# Patient Record
Sex: Male | Born: 1945 | Race: White | Hispanic: No | Marital: Married | Smoking: Former smoker
Health system: Southern US, Community
[De-identification: ages and names within clinical notes are randomized; demographics above are authoritative.]

## PROBLEM LIST (undated history)

## (undated) DIAGNOSIS — K5190 Ulcerative colitis, unspecified, without complications: Secondary | ICD-10-CM

## (undated) DIAGNOSIS — K529 Noninfective gastroenteritis and colitis, unspecified: Secondary | ICD-10-CM

## (undated) DIAGNOSIS — E039 Hypothyroidism, unspecified: Secondary | ICD-10-CM

## (undated) DIAGNOSIS — I10 Essential (primary) hypertension: Secondary | ICD-10-CM

## (undated) DIAGNOSIS — E785 Hyperlipidemia, unspecified: Secondary | ICD-10-CM

## (undated) HISTORY — DX: Noninfective gastroenteritis and colitis, unspecified: K52.9

## (undated) HISTORY — DX: Hyperlipidemia, unspecified: E78.5

## (undated) HISTORY — PX: LUNG SURGERY: SHX703

## (undated) HISTORY — DX: Hypothyroidism, unspecified: E03.9

## (undated) HISTORY — DX: Ulcerative colitis, unspecified, without complications: K51.90

## (undated) HISTORY — PX: BACK SURGERY: SHX140

---

## 2001-02-02 ENCOUNTER — Ambulatory Visit (HOSPITAL_COMMUNITY): Admission: RE | Admit: 2001-02-02 | Discharge: 2001-02-02 | Payer: Self-pay | Admitting: Neurosurgery

## 2001-02-02 ENCOUNTER — Encounter: Payer: Self-pay | Admitting: Neurosurgery

## 2001-03-08 ENCOUNTER — Encounter: Admission: RE | Admit: 2001-03-08 | Discharge: 2001-03-24 | Payer: Self-pay | Admitting: Neurosurgery

## 2001-08-04 HISTORY — PX: COLONOSCOPY: SHX174

## 2002-01-26 ENCOUNTER — Ambulatory Visit (HOSPITAL_COMMUNITY): Admission: RE | Admit: 2002-01-26 | Discharge: 2002-01-26 | Payer: Self-pay | Admitting: Internal Medicine

## 2003-03-23 ENCOUNTER — Encounter: Payer: Self-pay | Admitting: Emergency Medicine

## 2003-03-23 ENCOUNTER — Emergency Department (HOSPITAL_COMMUNITY): Admission: EM | Admit: 2003-03-23 | Discharge: 2003-03-23 | Payer: Self-pay

## 2004-08-26 ENCOUNTER — Ambulatory Visit: Payer: Self-pay | Admitting: Family Medicine

## 2005-03-21 ENCOUNTER — Ambulatory Visit: Payer: Self-pay | Admitting: Family Medicine

## 2005-04-16 ENCOUNTER — Ambulatory Visit: Payer: Self-pay | Admitting: Family Medicine

## 2007-09-21 ENCOUNTER — Ambulatory Visit: Payer: Self-pay | Admitting: Vascular Surgery

## 2010-12-17 NOTE — Procedures (Signed)
CAROTID DUPLEX EXAM   INDICATION:  Bilateral visual disturbance.   HISTORY:  Diabetes:  No.  Cardiac:  No.  Hypertension:  Yes.  Smoking:  Quit 30 years ago.  Previous Surgery:  No.  CV History:  No.  Amaurosis Fugax No, Paresthesias No, Hemiparesis No                                       RIGHT             LEFT  Brachial systolic pressure:         NA                NA  Brachial Doppler waveforms:         Biphasic          Biphasic  Vertebral direction of flow:        Antegrade         Antegrade  DUPLEX VELOCITIES (cm/sec)  CCA peak systolic                   143               941  ECA peak systolic                   134               91  ICA peak systolic                   101               60  ICA end diastolic                   20                17  PLAQUE MORPHOLOGY:                  Heterogenous      Heterogenous  PLAQUE AMOUNT:                      Mild              Mild  PLAQUE LOCATION:                    ICA and ECA       ICA and ECA   IMPRESSION:  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  Bilateral  vertebral and basilar arteries appear within normal limits.   ___________________________________________  Judeth Cornfield. Scot Dock, M.D.   MG/MEDQ  D:  09/21/2007  T:  09/22/2007  Job:  [id number]

## 2010-12-20 NOTE — Op Note (Signed)
[facility]  Patient:    Jerome Stark, Jerome Stark Visit Number: [id number] MRN: [id number]          Service Type: END Location: DAY Attending Physician:  Bridgette Habermann Dictated by:   Garfield Cornea, M.D. Proc. Date: 01/26/02 Admit Date:  01/26/2002 Discharge Date: 01/26/2002   CC:         Dr. Edwina Barth   Operative Report  PROCEDURE:  Colonoscopy with biopsy and stool sampling.  INDICATIONS FOR PROCEDURE:  The patient is a 65 year old gentleman with recently bloody diarrhea.  Stool studies were negative as an outpatient.  He comes for colonoscopy.  This approach has been discussed with the patient. The potential risks, benefits, and alternatives have been reviewed and questions answered.  He is agreeable.  He is a low risk for conscious sedation. Please see my dictated consultation note for more information.  PROCEDURE NOTE:  O2 saturation, pulse, and respirations were monitored throughout the entire procedure.  CONSCIOUS SEDATION:  Versed 3 mg IV and Demerol 75 mg IV in divided doses.  INSTRUMENT:  Olympus video chip colonoscope.  FINDINGS:  Digital rectal exam revealed no abnormalities.  ENDOSCOPIC FINDINGS:  The prep was good.  RECTUM:  Examination of the rectal mucosa including a retroflexed view of the anal verge revealed marked inflammatory changes of the distal 12 cm of the rectal mucosa with loss of normal vascular pattern, granular friability, and erosions. This was confluent to 12 cm.  In the more proximal rectum and into the colon, the mucosa appeared entirely normal.  Please see photos.  COLON:  The colonic mucosa was surveyed through the sigmoid junction through the left transverse and right colon to the area of the appendiceal orifice, ileocecal valve, and cecum.  These structures were well-seen and photographed for the record.  From this level, the scope was slowly withdrawn.  All previously mentioned mucosal surfaces were again seen.   The colonic mucosa appeared normal.  Biopsies of the sigmoid mucosa and the abnormal-appearing rectal mucosa were taken.  ______ suctioned for microbiology studies.  The patient tolerated the procedure well and was reacted in endoscopy.  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  The more proximal rectum and colon appeared normal.  Biopsies pending.  RECOMMENDATIONS:  Canasa 500 mg suppositories, 1 per rectum, b.i.d. x2 weeks and then drop back to 1 at bedtime thereafter.  Followup on biopsies and stool studies.  We will have the patient come back to see [place] in four weeks to assess progress. Dictated by:   Garfield Cornea, M.D. Attending Physician:  Bridgette Habermann DD:  01/26/02 TD:  01/28/02 Job: [id number]

## 2010-12-20 NOTE — Op Note (Signed)
[place]  Patient:    Jerome Stark, Jerome Stark                          MRN: [id number] Proc. Date: 02/02/01 Adm. 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  PROCEDURE:  Right L5-S1 laminotomy and microdiskectomy.  SURGEON:  Earnie Larsson, M.D.  ASSISTANT:  Marc Morgans., M.D.  ANESTHESIA:  General endotracheal.  INDICATIONS:  Jerome Stark is a 65 year old white male with history of back and right lower extremity pain consistent with a right-sided S1 radiculopathy, which has been present for approximately six months.  The patient has failed conservative management.  He continues to be bothered by these symptoms with great frequency.  We discussed possible options, including the possibility of undergoing a right-sided L5-S1 laminotomy and microdiskectomy.  The patient wishes to proceed with surgery in hopes of relieving some of his symptoms. The patient is aware of the risks and benefits and wishes to proceed.  DESCRIPTION OF PROCEDURE:  Patient in the operating room, placed on the operating table in the supine position.  After an adequate level of anesthesia achieved, patient positioned prone onto a Wilson frame, appropriately padded. The patients lumbar region is shaved and prepped sterilely.  A #10 blade is used to make a linear skin incision overlying the L5-S1 interspace.  This was carried down sharply in the midline.  A subperiosteal dissection was performed on the right side, exposing the laminae and facet joints of L5 and S1.  A deep self-retaining retractor is placed.  Intraoperative x-ray was taken.  The level was confirmed.  Laminotomy was then performed using the high-speed drill and Kerrison rongeurs to remove the  inferior one-third of the lamina of L5, the medial edge of the L5-S1 facet joint, and the superior rim of the S1 lamina.  Ligamentum flavum was then elevated and resected in a piecemeal fashion using Kerrison rongeurs.  The underlying thecal sac and exiting S1 nerve root were identified.  Microscope was brought into the field and used for microdissection of the right-sided S1 nerve root underlying disk herniation.  Epidural venous plexus coagulated and cut.  Thecal sac and S1 nerve root were mobilized and retracted toward the midline.  The disk herniation at the level of the disk space was readily apparent.  This was isolated and incised with a 15 blade in a rectangular fashion.  A wide disk space clean-out was then achieved using pituitary rongeurs, upward-angled pituitary rongeurs, and Epstein curettes.  A scarred-in, chronic inferior fragment was then dissected free along the course of the right-sided S1 nerve root.  This was completely resected, fully decompressing the right-sided S1 nerve root.  At this point, a probe was passed easily along the course of the thecal sac and nerve roots.  There was no evidence of any continuing compression.  The wound was then copiously irrigated with antibiotic solution. Gelfoam was placed topically for hemostasis, which was found to be good. Microscope and retractor system were removed.  Hemostasis in the muscle was achieved with electrocautery.  The wound was then closed in layers with Vicryl sutures.  Steri-Strips and a sterile dressing were applied.  There were no apparent complications.  The patient tolerated the procedure well, and he returns to the recovery room postop. DD:  02/02/01 TD:  02/02/01 Job: [id number]

## 2013-10-02 HISTORY — PX: COLONOSCOPY: SHX174

## 2014-11-20 ENCOUNTER — Emergency Department (HOSPITAL_COMMUNITY)
Admission: EM | Admit: 2014-11-20 | Discharge: 2014-11-20 | Disposition: A | Discharge status: Home / Self Care | Payer: Medicare Other | Attending: Emergency Medicine | Admitting: Emergency Medicine

## 2014-11-20 ENCOUNTER — Encounter (HOSPITAL_COMMUNITY): Payer: Self-pay | Admitting: Emergency Medicine

## 2014-11-20 ENCOUNTER — Emergency Department (HOSPITAL_COMMUNITY): Payer: Medicare Other

## 2014-11-20 DIAGNOSIS — Z87891 Personal history of nicotine dependence: Secondary | ICD-10-CM | POA: Diagnosis not present

## 2014-11-20 DIAGNOSIS — Z79899 Other long term (current) drug therapy: Secondary | ICD-10-CM | POA: Insufficient documentation

## 2014-11-20 DIAGNOSIS — E079 Disorder of thyroid, unspecified: Secondary | ICD-10-CM | POA: Insufficient documentation

## 2014-11-20 DIAGNOSIS — I10 Essential (primary) hypertension: Secondary | ICD-10-CM | POA: Insufficient documentation

## 2014-11-20 DIAGNOSIS — R1032 Left lower quadrant pain: Secondary | ICD-10-CM

## 2014-11-20 DIAGNOSIS — K921 Melena: Secondary | ICD-10-CM

## 2014-11-20 DIAGNOSIS — K529 Noninfective gastroenteritis and colitis, unspecified: Secondary | ICD-10-CM | POA: Insufficient documentation

## 2014-11-20 DIAGNOSIS — R109 Unspecified abdominal pain: Secondary | ICD-10-CM

## 2014-11-20 HISTORY — DX: Essential (primary) hypertension: I10

## 2014-11-20 LAB — SEDIMENTATION RATE: Sed Rate: 63 mm/hr — ABNORMAL HIGH (ref 0–16)

## 2014-11-20 LAB — CBC WITH DIFFERENTIAL/PLATELET
Basophils Absolute: 0 10*3/uL (ref 0.0–0.1)
Basophils Relative: 0 % (ref 0–1)
EOS PCT: 5 % (ref 0–5)
Eosinophils Absolute: 0.5 10*3/uL (ref 0.0–0.7)
HCT: 36.7 % — ABNORMAL LOW (ref 39.0–52.0)
HEMOGLOBIN: 12.1 g/dL — AB (ref 13.0–17.0)
Lymphocytes Relative: 24 % (ref 12–46)
Lymphs Abs: 2.5 10*3/uL (ref 0.7–4.0)
MCH: 27.3 pg (ref 26.0–34.0)
MCHC: 33 g/dL (ref 30.0–36.0)
MCV: 82.8 fL (ref 78.0–100.0)
MONOS PCT: 10 % (ref 3–12)
Monocytes Absolute: 1 10*3/uL (ref 0.1–1.0)
Neutro Abs: 6 10*3/uL (ref 1.7–7.7)
Neutrophils Relative %: 61 % (ref 43–77)
PLATELETS: 328 10*3/uL (ref 150–400)
RBC: 4.43 MIL/uL (ref 4.22–5.81)
RDW: 14.3 % (ref 11.5–15.5)
WBC: 10 10*3/uL (ref 4.0–10.5)

## 2014-11-20 LAB — COMPREHENSIVE METABOLIC PANEL
ALK PHOS: 75 U/L (ref 39–117)
ALT: 14 U/L (ref 0–53)
AST: 11 U/L (ref 0–37)
Albumin: 2.6 g/dL — ABNORMAL LOW (ref 3.5–5.2)
Anion gap: 7 (ref 5–15)
BUN: 8 mg/dL (ref 6–23)
CO2: 25 mmol/L (ref 19–32)
CREATININE: 0.87 mg/dL (ref 0.50–1.35)
Calcium: 8.2 mg/dL — ABNORMAL LOW (ref 8.4–10.5)
Chloride: 104 mmol/L (ref 96–112)
GFR, EST NON AFRICAN AMERICAN: 87 mL/min — AB (ref >90)
Glucose, Bld: 108 mg/dL — ABNORMAL HIGH (ref 70–99)
Potassium: 3.8 mmol/L (ref 3.5–5.1)
SODIUM: 136 mmol/L (ref 135–145)
Total Bilirubin: 0.5 mg/dL (ref 0.3–1.2)
Total Protein: 6.7 g/dL (ref 6.0–8.3)

## 2014-11-20 LAB — LIPASE, BLOOD: Lipase: 19 U/L (ref 11–59)

## 2014-11-20 MED ORDER — ONDANSETRON 4 MG PO TBDP: 4.0000 mg | ORAL_TABLET | Freq: Three times a day (TID) | ORAL | Status: DC | PRN

## 2014-11-20 MED ORDER — METRONIDAZOLE 500 MG PO TABS: 500.0000 mg | ORAL_TABLET | Freq: Three times a day (TID) | ORAL | Status: DC

## 2014-11-20 MED ORDER — SODIUM CHLORIDE 0.9 % IV BOLUS (SEPSIS)
1000.0000 mL | Freq: Once | INTRAVENOUS | Status: AC
  Administered 2014-11-20: 1000 mL via INTRAVENOUS

## 2014-11-20 MED ORDER — CIPROFLOXACIN HCL 500 MG PO TABS: 500.0000 mg | ORAL_TABLET | Freq: Two times a day (BID) | ORAL | Status: DC

## 2014-11-20 MED ORDER — SODIUM CHLORIDE 0.9 % IV SOLN
Freq: Once | INTRAVENOUS | Status: AC
  Administered 2014-11-20: 13:00:00 via INTRAVENOUS

## 2014-11-20 MED ORDER — CIPROFLOXACIN HCL 250 MG PO TABS
500.0000 mg | ORAL_TABLET | Freq: Once | ORAL | Status: AC
  Administered 2014-11-20: 500 mg via ORAL
  Filled 2014-11-20: qty 2

## 2014-11-20 MED ORDER — HYDROCODONE-ACETAMINOPHEN 5-325 MG PO TABS: 1.0000 | ORAL_TABLET | ORAL | Status: DC | PRN

## 2014-11-20 MED ORDER — METRONIDAZOLE 500 MG PO TABS
500.0000 mg | ORAL_TABLET | Freq: Once | ORAL | Status: AC
  Administered 2014-11-20: 500 mg via ORAL
  Filled 2014-11-20: qty 1

## 2014-11-20 MED ORDER — IOHEXOL 300 MG/ML  SOLN
100.0000 mL | Freq: Once | INTRAMUSCULAR | Status: AC | PRN
  Administered 2014-11-20: 100 mL via INTRAVENOUS

## 2014-11-20 NOTE — Discharge Instructions (Signed)
Colitis Colitis is inflammation of the colon. Colitis can be a short-term or long-standing (chronic) illness. Crohn's disease and ulcerative colitis are 2 types of colitis which are chronic. They usually require lifelong treatment. CAUSES  There are many different causes of colitis, including:  Viruses.  Germs (bacteria).  Medicine reactions. SYMPTOMS   Diarrhea.  Intestinal bleeding.  Pain.  Fever.  Throwing up (vomiting).  Tiredness (fatigue).  Weight loss.  Bowel blockage. DIAGNOSIS  The diagnosis of colitis is based on examination and stool or blood tests. X-rays, CT scan, and colonoscopy may also be needed. TREATMENT  Treatment may include:  Fluids given through the vein (intravenously).  Bowel rest (nothing to eat or drink for a period of time).  Medicine for pain and diarrhea.  Medicines (antibiotics) that kill germs.  Cortisone medicines.  Surgery. HOME CARE INSTRUCTIONS   Get plenty of rest.  Drink enough water and fluids to keep your urine clear or pale yellow.  Eat a well-balanced diet.  Call your caregiver for follow-up as recommended. SEEK IMMEDIATE MEDICAL CARE IF:   You develop chills.  You have an oral temperature above 102 F (38.9 C), not controlled by medicine.  You have extreme weakness, fainting, or dehydration.  You have repeated vomiting.  You develop severe belly (abdominal) pain or are passing bloody or tarry stools. MAKE SURE YOU:   Understand these instructions.  Will watch your condition.  Will get help right away if you are not doing well or get worse. Document Released: 08/28/2004 Document Revised: 10/13/2011 Document Reviewed: 11/23/2009 [employer] Patient Information 2015 [place], [place]. This information is not intended to replace advice given to you by your health care provider. Make sure you discuss any questions you have with your health care provider.

## 2014-11-20 NOTE — ED Provider Notes (Signed)
CSN: [id number]     Arrival date & time 11/20/14  1006 History  This chart was scribed for Tanna Furry, MD by Molli Posey, ED Scribe. This patient was seen in room APA03/APA03 and the patient's care was started 11:29 AM.  Chief Complaint  Patient presents with  . Abdominal Pain   The history is provided by the patient. No language interpreter was used.   HPI Comments: LAM MCCUBBINS is a 69 y.o. male with a history of HTN and thyroid disease who presents to the Emergency Department complaining of mild, lower abdominal pain for the last 1.5 weeks. Pt rates his pain has worsened in the last 2 days and rates the pain as a 7/10 at its worst. He reports that  he has had diarrhea for the last year which started to become more watery and loose in the last 1.5 weeks. Pt states that he is not having more diarrhea than at his baseline. He reports an associated nausea and a decrease in appetite. Pt states that eating induces his BMs. Pt reports that he has noticed bright red blood after his BMs for the last year. Pt states he has tried OTC anti-diarrhea medication with no relief. He states he a colonoscopy in June of 2015 where he had 3 polyps removed but says it was normal otherwise. He denies vomiting   Past Medical History  Diagnosis Date  . Hypertension   . Thyroid disease    Past Surgical History  Procedure Laterality Date  . Lung surgery    . Back surgery     Family History  Problem Relation Age of Onset  . Hypertension Mother   . Hypertension Other    History  Substance Use Topics  . Smoking status: Former Research scientist (life sciences)  . Smokeless tobacco: Never Used  . Alcohol Use: Yes     Comment: occas    Review of Systems  Constitutional: Positive for appetite change. Negative for fever, chills, diaphoresis and fatigue.  HENT: Negative for mouth sores, sore throat and trouble swallowing.   Eyes: Negative for visual disturbance.  Respiratory: Negative for cough, chest tightness, shortness of breath  and wheezing.   Cardiovascular: Negative for chest pain.  Gastrointestinal: Positive for nausea, abdominal pain, diarrhea and blood in stool. Negative for vomiting and abdominal distention.  Endocrine: Negative for polydipsia, polyphagia and polyuria.  Genitourinary: Negative for dysuria, frequency and hematuria.  Musculoskeletal: Negative for gait problem.  Skin: Negative for color change, pallor and rash.  Neurological: Negative for dizziness, syncope, light-headedness and headaches.  Hematological: Does not bruise/bleed easily.  Psychiatric/Behavioral: Negative for behavioral problems and confusion.   Allergies  Review of patient's allergies indicates no known allergies.  Home Medications   Prior to Admission medications   Medication Sig Start Date End Date Taking? Authorizing Provider  amLODipine (NORVASC) 10 MG tablet Take 10 mg by mouth every morning. 09/11/14  Yes Historical Provider, MD  hydrochlorothiazide (HYDRODIURIL) 25 MG tablet Take 25 mg by mouth daily. 09/11/14  Yes Historical Provider, MD  levothyroxine (SYNTHROID, LEVOTHROID) 75 MCG tablet Take 75 mcg by mouth daily before breakfast.   Yes Historical Provider, MD  QC LORATADINE ALLERGY RELIEF 10 MG tablet Take 10 mg by mouth daily. 09/11/14  Yes Historical Provider, MD  ciprofloxacin (CIPRO) 500 MG tablet Take 1 tablet (500 mg total) by mouth every 12 (twelve) hours. 11/20/14   Tanna Furry, MD  HYDROcodone-acetaminophen (NORCO/VICODIN) 5-325 MG per tablet Take 1 tablet by mouth every 4 (four) hours as  needed. 11/20/14   Tanna Furry, MD  metroNIDAZOLE (FLAGYL) 500 MG tablet Take 1 tablet (500 mg total) by mouth 3 (three) times daily. 11/20/14   Tanna Furry, MD  ondansetron (ZOFRAN ODT) 4 MG disintegrating tablet Take 1 tablet (4 mg total) by mouth every 8 (eight) hours as needed for nausea. 11/20/14   Tanna Furry, MD   BP 122/75 mmHg  Pulse 80  Temp(Src) 98.9 F (37.2 C) (Oral)  Resp 20  Ht 5' 11"  (1.803 m)  Wt 165 lb (74.844 kg)   BMI 23.02 kg/m2  SpO2 97% Physical Exam  Constitutional: He is oriented to person, place, and time. He appears well-developed and well-nourished. No distress.  HENT:  Head: Normocephalic.  Dry mucous membranes.   Eyes: Conjunctivae are normal. Pupils are equal, round, and reactive to light. No scleral icterus.  Neck: Normal range of motion. Neck supple. No thyromegaly present.  Cardiovascular: Normal rate and regular rhythm.  Exam reveals no gallop and no friction rub.   No murmur heard. Pulmonary/Chest: Effort normal and breath sounds normal. No respiratory distress. He has no wheezes. He has no rales.  Abdominal: Soft. Bowel sounds are normal. He exhibits no distension. There is tenderness. There is no rebound.  Mild LLQ tenderness.   Musculoskeletal: Normal range of motion.  Neurological: He is alert and oriented to person, place, and time.  Skin: Skin is warm and dry. No rash noted.  Psychiatric: He has a normal mood and affect. His behavior is normal.  Nursing note and vitals reviewed.   ED Course  Procedures   DIAGNOSTIC STUDIES: Oxygen Saturation is 99% on RA, normal by my interpretation.    COORDINATION OF CARE: 11:41 AM Discussed treatment plan with pt at bedside and pt agreed to plan.   Labs Review Labs Reviewed  CBC WITH DIFFERENTIAL/PLATELET - Abnormal; Notable for the following:    Hemoglobin 12.1 (*)    HCT 36.7 (*)    All other components within normal limits  COMPREHENSIVE METABOLIC PANEL - Abnormal; Notable for the following:    Glucose, Bld 108 (*)    Calcium 8.2 (*)    Albumin 2.6 (*)    GFR calc non Af Amer 87 (*)    All other components within normal limits  SEDIMENTATION RATE - Abnormal; Notable for the following:    Sed Rate 63 (*)    All other components within normal limits  LIPASE, BLOOD    Imaging Review Ct Abdomen Pelvis W Contrast  11/20/2014   CLINICAL DATA:  Left lower quadrant abdominal pain.  EXAM: CT ABDOMEN AND PELVIS WITH  CONTRAST  TECHNIQUE: Multidetector CT imaging of the abdomen and pelvis was performed using the standard protocol following bolus administration of intravenous contrast.  CONTRAST:  170m OMNIPAQUE IOHEXOL 300 MG/ML  SOLN  COMPARISON:  None.  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 Visualized lung bases appear normal.  No gallstones are noted. No focal abnormality is noted in the liver or pancreas. Mild splenomegaly is noted. Adrenal glands appear normal. Kidneys appear normal. No hydronephrosis or renal obstruction is noted. No renal or ureteral calculi are noted. Atherosclerotic calcifications of abdominal aorta are noted without aneurysm formation.  There is no evidence of bowel obstruction. Significant wall thickening is seen involving the distal transverse colon, descending colon, sigmoid colon and rectum motor consistent with inflammatory or infectious colitis. No abnormal fluid collection is noted. Urinary bladder appears normal. No significant adenopathy is noted.  IMPRESSION: Significant  wall thickening is seen involving the distal transverse, descending and sigmoid colon, as well as the rectum, most consistent with inflammatory or infectious colitis. Ischemic colitis cannot be excluded, but is felt to be much less likely.   Electronically Signed   By: Marijo Conception, M.D.   On: 11/20/2014 14:16     EKG Interpretation None      MDM   Final diagnoses:  AP (abdominal pain)  Bloody stool  LLQ pain  Colitis    Patient with colonic inflammation from the mid transverse colon distally. Elevated sedimentation rate. Does not have leukocytosis. He is afebrile. Does not have a significant history of vascular disease in his pain is mild. Down and ischemic colitis. Plan is Flagyl Cipro. GI follow-up. He requests local GI as his previous GI has been in [place]. He had a normal colonoscopy per his report other than benign polyp removal 1 year ago.     Tanna Furry,  MD 11/20/14 1447

## 2014-11-20 NOTE — ED Notes (Signed)
Pt reports diarrhea x 1 year, has been seen by mult MDs. No known cause. Pt reports over the past 2 weeks he has developed lower abd pain and has had blood in his stool. Pt states he has been unable to eat since yesterday.

## 2014-11-20 NOTE — ED Notes (Signed)
MD Jeneen Rinks at bedside.

## 2014-11-24 ENCOUNTER — Other Ambulatory Visit: Payer: Self-pay | Admitting: Gastroenterology

## 2014-11-24 ENCOUNTER — Other Ambulatory Visit: Payer: Self-pay

## 2014-11-24 ENCOUNTER — Telehealth: Payer: Self-pay

## 2014-11-24 DIAGNOSIS — R197 Diarrhea, unspecified: Secondary | ICD-10-CM

## 2014-11-24 NOTE — Telephone Encounter (Signed)
I called pt and he is scheduled to see Neil Crouch, PA on 11/30/2014 at 11:00 AM. The other appt was cancelled with Walden Field, NP. Pt is aware to go to [facility] to get the containers to do C-Diff and Stool culture. I am faxing the orders to [facility]. Routing to Manuela Schwartz to  Check on records and to [place] to check on insurance.

## 2014-11-24 NOTE — Telephone Encounter (Signed)
Pt left VM and I returned his call. [phone]). He said he was recently seen in the ED for inflamed colon and needs to schedule colonoscopy. I told him he will have to be seen in the office before scheduling a colonoscopy because of the problem. I have scheduled him for an OV with Walden Field, NP on 12/12/2014 at 8:30 Am. He is on Cipro and Flagyl now and was hoping to get an earlier appt. Dr. Gala Romney did a colonoscopy for him on 01/26/2002. But he has seen Dr. Britta Mccreedy and does not want to go back to see Dr. Britta Mccreedy.  Routing to Neil Crouch, PA for advice/ recommendations.

## 2014-11-24 NOTE — Telephone Encounter (Signed)
I faxed a request to Dr Wylie Hail office ASAP to get records. I will also call [place] for the same.

## 2014-11-24 NOTE — Telephone Encounter (Signed)
Opened in error

## 2014-11-24 NOTE — Telephone Encounter (Signed)
I would recommend getting records from [place]. And get last TCS from [facility] and path as soon as possible. Patient needs to have C.diff PCR, stool culture. OV next week. I have an opening on Thursday.

## 2014-11-26 ENCOUNTER — Inpatient Hospital Stay (HOSPITAL_COMMUNITY)
Admission: EM | Admit: 2014-11-26 | Discharge: 2014-11-30 | DRG: 385 | Disposition: A | Discharge status: Home / Self Care | Payer: Medicare Other | Attending: Internal Medicine | Admitting: Internal Medicine

## 2014-11-26 ENCOUNTER — Encounter (HOSPITAL_COMMUNITY): Payer: Self-pay | Admitting: Emergency Medicine

## 2014-11-26 ENCOUNTER — Emergency Department (HOSPITAL_COMMUNITY): Payer: Medicare Other

## 2014-11-26 DIAGNOSIS — Z6821 Body mass index (BMI) 21.0-21.9, adult: Secondary | ICD-10-CM

## 2014-11-26 DIAGNOSIS — K5190 Ulcerative colitis, unspecified, without complications: Secondary | ICD-10-CM | POA: Insufficient documentation

## 2014-11-26 DIAGNOSIS — K5130 Ulcerative (chronic) rectosigmoiditis without complications: Principal | ICD-10-CM | POA: Diagnosis present

## 2014-11-26 DIAGNOSIS — K529 Noninfective gastroenteritis and colitis, unspecified: Secondary | ICD-10-CM | POA: Diagnosis present

## 2014-11-26 DIAGNOSIS — E43 Unspecified severe protein-calorie malnutrition: Secondary | ICD-10-CM | POA: Insufficient documentation

## 2014-11-26 DIAGNOSIS — Z87891 Personal history of nicotine dependence: Secondary | ICD-10-CM

## 2014-11-26 DIAGNOSIS — I10 Essential (primary) hypertension: Secondary | ICD-10-CM | POA: Diagnosis present

## 2014-11-26 DIAGNOSIS — E039 Hypothyroidism, unspecified: Secondary | ICD-10-CM | POA: Diagnosis present

## 2014-11-26 LAB — COMPREHENSIVE METABOLIC PANEL
ALBUMIN: 2.3 g/dL — AB (ref 3.5–5.2)
ALT: 16 U/L (ref 0–53)
AST: 18 U/L (ref 0–37)
Alkaline Phosphatase: 51 U/L (ref 39–117)
Anion gap: 6 (ref 5–15)
BUN: 7 mg/dL (ref 6–23)
CO2: 27 mmol/L (ref 19–32)
Calcium: 7.8 mg/dL — ABNORMAL LOW (ref 8.4–10.5)
Chloride: 99 mmol/L (ref 96–112)
Creatinine, Ser: 0.96 mg/dL (ref 0.50–1.35)
GFR, EST NON AFRICAN AMERICAN: 83 mL/min — AB (ref >90)
GLUCOSE: 141 mg/dL — AB (ref 70–99)
Potassium: 3.8 mmol/L (ref 3.5–5.1)
Sodium: 132 mmol/L — ABNORMAL LOW (ref 135–145)
TOTAL PROTEIN: 6 g/dL (ref 6.0–8.3)
Total Bilirubin: 0.4 mg/dL (ref 0.3–1.2)

## 2014-11-26 LAB — CBC WITH DIFFERENTIAL/PLATELET
BASOS ABS: 0 10*3/uL (ref 0.0–0.1)
BASOS PCT: 0 % (ref 0–1)
EOS ABS: 0.2 10*3/uL (ref 0.0–0.7)
Eosinophils Relative: 3 % (ref 0–5)
HCT: 33.2 % — ABNORMAL LOW (ref 39.0–52.0)
HEMOGLOBIN: 10.9 g/dL — AB (ref 13.0–17.0)
LYMPHS ABS: 2.5 10*3/uL (ref 0.7–4.0)
LYMPHS PCT: 34 % (ref 12–46)
MCH: 26.5 pg (ref 26.0–34.0)
MCHC: 32.8 g/dL (ref 30.0–36.0)
MCV: 80.8 fL (ref 78.0–100.0)
MONO ABS: 1 10*3/uL (ref 0.1–1.0)
MONOS PCT: 13 % — AB (ref 3–12)
Neutro Abs: 3.7 10*3/uL (ref 1.7–7.7)
Neutrophils Relative %: 50 % (ref 43–77)
Platelets: 380 10*3/uL (ref 150–400)
RBC: 4.11 MIL/uL — AB (ref 4.22–5.81)
RDW: 14.4 % (ref 11.5–15.5)
WBC: 7.5 10*3/uL (ref 4.0–10.5)

## 2014-11-26 LAB — URINALYSIS, ROUTINE W REFLEX MICROSCOPIC
Bilirubin Urine: NEGATIVE
GLUCOSE, UA: NEGATIVE mg/dL
HGB URINE DIPSTICK: NEGATIVE
KETONES UR: NEGATIVE mg/dL
LEUKOCYTES UA: NEGATIVE
NITRITE: NEGATIVE
Protein, ur: NEGATIVE mg/dL
Specific Gravity, Urine: 1.015 (ref 1.005–1.030)
Urobilinogen, UA: 0.2 mg/dL (ref 0.0–1.0)
pH: 7 (ref 5.0–8.0)

## 2014-11-26 LAB — OCCULT BLOOD X 1 CARD TO LAB, STOOL: FECAL OCCULT BLD: POSITIVE — AB

## 2014-11-26 LAB — LIPASE, BLOOD: LIPASE: 29 U/L (ref 11–59)

## 2014-11-26 LAB — LACTIC ACID, PLASMA
Lactic Acid, Venous: 0.9 mmol/L (ref 0.5–2.0)
Lactic Acid, Venous: 0.8 mmol/L (ref 0.5–2.0)

## 2014-11-26 LAB — CLOSTRIDIUM DIFFICILE BY PCR: Toxigenic C. Difficile by PCR: NEGATIVE

## 2014-11-26 MED ORDER — HEPARIN SODIUM (PORCINE) 5000 UNIT/ML IJ SOLN
5000.0000 [IU] | Freq: Three times a day (TID) | INTRAMUSCULAR | Status: AC
  Administered 2014-11-26 – 2014-11-28 (×6): 5000 [IU] via SUBCUTANEOUS
  Filled 2014-11-26 (×6): qty 1

## 2014-11-26 MED ORDER — ONDANSETRON HCL 4 MG/2ML IJ SOLN: 4.0000 mg | Freq: Four times a day (QID) | INTRAMUSCULAR | Status: DC | PRN

## 2014-11-26 MED ORDER — ONDANSETRON HCL 4 MG PO TABS: 4.0000 mg | ORAL_TABLET | Freq: Four times a day (QID) | ORAL | Status: DC | PRN

## 2014-11-26 MED ORDER — IOHEXOL 300 MG/ML  SOLN
50.0000 mL | Freq: Once | INTRAMUSCULAR | Status: AC | PRN
  Administered 2014-11-26: 50 mL via ORAL

## 2014-11-26 MED ORDER — IOHEXOL 300 MG/ML  SOLN
100.0000 mL | Freq: Once | INTRAMUSCULAR | Status: AC | PRN
  Administered 2014-11-26: 100 mL via INTRAVENOUS

## 2014-11-26 MED ORDER — METRONIDAZOLE IN NACL 5-0.79 MG/ML-% IV SOLN
500.0000 mg | Freq: Three times a day (TID) | INTRAVENOUS | Status: DC
  Administered 2014-11-27 – 2014-11-30 (×10): 500 mg via INTRAVENOUS
  Filled 2014-11-26 (×10): qty 100

## 2014-11-26 MED ORDER — ONDANSETRON HCL 4 MG/2ML IJ SOLN
4.0000 mg | Freq: Once | INTRAMUSCULAR | Status: AC
  Administered 2014-11-26: 4 mg via INTRAVENOUS
  Filled 2014-11-26: qty 2

## 2014-11-26 MED ORDER — CIPROFLOXACIN IN D5W 400 MG/200ML IV SOLN
400.0000 mg | Freq: Once | INTRAVENOUS | Status: AC
  Filled 2014-11-26: qty 200

## 2014-11-26 MED ORDER — METRONIDAZOLE IN NACL 5-0.79 MG/ML-% IV SOLN
500.0000 mg | Freq: Once | INTRAVENOUS | Status: AC
  Administered 2014-11-26: 500 mg via INTRAVENOUS
  Filled 2014-11-26: qty 100

## 2014-11-26 MED ORDER — CIPROFLOXACIN IN D5W 400 MG/200ML IV SOLN
400.0000 mg | Freq: Two times a day (BID) | INTRAVENOUS | Status: DC
  Administered 2014-11-26 – 2014-11-29 (×7): 400 mg via INTRAVENOUS
  Filled 2014-11-26 (×6): qty 200

## 2014-11-26 MED ORDER — SODIUM CHLORIDE 0.9 % IV BOLUS (SEPSIS)
1000.0000 mL | Freq: Once | INTRAVENOUS | Status: AC
  Administered 2014-11-26: 1000 mL via INTRAVENOUS

## 2014-11-26 MED ORDER — DEXAMETHASONE SODIUM PHOSPHATE 10 MG/ML IJ SOLN
10.0000 mg | Freq: Once | INTRAMUSCULAR | Status: AC
  Administered 2014-11-26: 10 mg via INTRAVENOUS
  Filled 2014-11-26: qty 1

## 2014-11-26 MED ORDER — SODIUM CHLORIDE 0.9 % IV SOLN
INTRAVENOUS | Status: DC
  Administered 2014-11-26 – 2014-11-30 (×7): via INTRAVENOUS

## 2014-11-26 NOTE — ED Notes (Addendum)
Pt here last Monday with same symptoms and states is no better. Was dx with Colitis. Pt still c/o abd pain to all over abd, gen weakness, nausea. Nad. Denies vomiting. lbm this am and was lose. Pt also c/o SOB started week ago also. Mm slightly dry. Denies cough/congestion

## 2014-11-26 NOTE — ED Provider Notes (Addendum)
CSN: [id number]     Arrival date & time 11/26/14  1531 History   First MD Initiated Contact with Patient 11/26/14 1552     Chief Complaint  Patient presents with  . Abdominal Pain     (Consider location/radiation/quality/duration/timing/severity/associated sxs/prior Treatment) Patient is a 69 y.o. male presenting with abdominal pain.  Abdominal Pain .... Generalized abdominal pain for 2 weeks with associated diarrhea. Patient was evaluated in the [facility] emergency department on 11/20/2014 and a CT scan of his abdomen/pelvis revealed significant wall thickening involving the distal transverse, descending, and sigmoid colon as well as rectum, consistent with inflammatory or infectious colitis. He was placed on oral Cipro and Flagyl. Patient has had intermittent diarrhea for years. Diarrhea is described as watery and brownish.  He has lost 10 pounds in the last 2 weeks. He typically goes to the [place] system and has seen a gastroenterologist in [place], [place], Dr. Britta Mccreedy.  Past Medical History  Diagnosis Date  . Hypertension   . Thyroid disease    Past Surgical History  Procedure Laterality Date  . Lung surgery    . Back surgery     Family History  Problem Relation Age of Onset  . Hypertension Mother   . Hypertension Other    History  Substance Use Topics  . Smoking status: Former Research scientist (life sciences)  . Smokeless tobacco: Never Used  . Alcohol Use: Yes     Comment: occas    Review of Systems  Gastrointestinal: Positive for abdominal pain.  All other systems reviewed and are negative.     Allergies  Review of patient's allergies indicates no known allergies.  Home Medications   Prior to Admission medications   Medication Sig Start Date End Date Taking? Authorizing Provider  amLODipine (NORVASC) 10 MG tablet Take 10 mg by mouth every morning. 09/11/14  Yes Historical Provider, MD  cholecalciferol (VITAMIN D) 1000 UNITS tablet Take 2,000 Units by mouth daily.   Yes Historical Provider, MD   ciprofloxacin (CIPRO) 500 MG tablet Take 1 tablet (500 mg total) by mouth every 12 (twelve) hours. 11/20/14  Yes Tanna Furry, MD  hydrochlorothiazide (HYDRODIURIL) 25 MG tablet Take 25 mg by mouth daily. 09/11/14  Yes Historical Provider, MD  levothyroxine (SYNTHROID, LEVOTHROID) 75 MCG tablet Take 75 mcg by mouth daily before breakfast.   Yes Historical Provider, MD  metroNIDAZOLE (FLAGYL) 500 MG tablet Take 1 tablet (500 mg total) by mouth 3 (three) times daily. 11/20/14  Yes Tanna Furry, MD  naproxen sodium (ANAPROX) 220 MG tablet Take 440 mg by mouth 2 (two) times daily as needed.   Yes Historical Provider, MD  Omega-3 Fatty Acids (FISH OIL PO) Take 2 capsules by mouth daily.   Yes Historical Provider, MD  ondansetron (ZOFRAN ODT) 4 MG disintegrating tablet Take 1 tablet (4 mg total) by mouth every 8 (eight) hours as needed for nausea. 11/20/14  Yes Tanna Furry, MD  QC LORATADINE ALLERGY RELIEF 10 MG tablet Take 10 mg by mouth daily as needed for allergies.  09/11/14  Yes Historical Provider, MD  HYDROcodone-acetaminophen (NORCO/VICODIN) 5-325 MG per tablet Take 1 tablet by mouth every 4 (four) hours as needed. Patient not taking: Reported on 11/26/2014 11/20/14   Tanna Furry, MD   BP 113/71 mmHg  Pulse 76  Temp(Src) 98.3 F (36.8 C) (Oral)  Resp 16  Ht 5' 11"  (1.803 m)  Wt 160 lb (72.576 kg)  BMI 22.33 kg/m2  SpO2 97% Physical Exam  Constitutional: He is oriented to person, place,  and time. He appears well-developed and well-nourished.  HENT:  Head: Normocephalic and atraumatic.  Eyes: Conjunctivae and EOM are normal. Pupils are equal, round, and reactive to light.  Neck: Normal range of motion. Neck supple.  Cardiovascular: Normal rate and regular rhythm.   Pulmonary/Chest: Effort normal and breath sounds normal.  Abdominal: Soft. Bowel sounds are normal.  Minimal diffuse tenderness  Musculoskeletal: Normal range of motion.  Neurological: He is alert and oriented to person, place, and time.   Skin: Skin is warm and dry.  Psychiatric: He has a normal mood and affect. His behavior is normal.  Nursing note and vitals reviewed.   ED Course  Procedures (including critical care time) Labs Review Labs Reviewed  COMPREHENSIVE METABOLIC PANEL - Abnormal; Notable for the following:    Sodium 132 (*)    Glucose, Bld 141 (*)    Calcium 7.8 (*)    Albumin 2.3 (*)    GFR calc non Af Amer 83 (*)    All other components within normal limits  CBC WITH DIFFERENTIAL/PLATELET - Abnormal; Notable for the following:    RBC 4.11 (*)    Hemoglobin 10.9 (*)    HCT 33.2 (*)    Monocytes Relative 13 (*)    All other components within normal limits  URINALYSIS, ROUTINE W REFLEX MICROSCOPIC  LIPASE, BLOOD    Imaging Review No results found.   EKG Interpretation None      MDM   Final diagnoses:  Colitis   Patient has failed outpatient treatment for his colitis. He continues to have worsening abdominal pain. He is not eating. CT scan reviewed from 11/20/2014.  IV Cipro, IV Flagyl, discussed with Dr. Ernestina Patches. Repeat CT scan of abdomen and pelvis ordered. He will evaluate patient.     Nat Christen, MD 11/26/14 Randol Kern  Nat Christen, MD 11/26/14 [phone]

## 2014-11-26 NOTE — ED Notes (Signed)
emtala form entered on wrong patient

## 2014-11-26 NOTE — H&P (Signed)
Hospitalist Admission History and Physical  Patient name: Jerome Stark Medical record number: [id number] Date of birth: [birth year] Age: 69 y.o. Gender: male  Primary Care Provider: Pcp Not In System  Chief Complaint: colitis   History of Present Illness:This is a 69 y.o. year old male with significant past medical history of HTN, hypothyroidism  presenting with colitis. Pt with 2 weeks of generalized malaise, abd pain, diarrhea. Was seen in ER approx 1 week ago. Had CT scan indicative of transverse, descending and sigmoid colitis. Pt was placed on course of oral cipro and flagyl. Pt reports compliance w/ medication regimen at home. Has had persistent fatigue, malaise, abd pain, vomiting and diarrhea. No fevers or chills. Diarrhea sometimes black/bright red. Denies any recent NSAID use. Reports colonoscopy 1 year ago through [place]. Had 3 polyps removed.  Presented to ER afebrile, hemodynamically stable. CBC and CMET grossly WNL. Lipase WNL. UA non indicative of infection. Repeat CT abd and pel pending.  Assessment and Plan: Jerome Stark is a 69 y.o. year old male presenting with Colitis   Active Problems:   Colitis   1- Colitis -IV cipro and flagyl  -hemoccult x1  -stool studies, c diff -f/u CT abd and pelvis -GI c/s in am  -antiemetics -IVFs -follow   2- HTN -LLN BPs  -mildly dry on exam -hold BP meds overnight -titrate as clinically indicated  3-Hypothyroidism -cont synthroid   FEN/GI: NPO for now  Prophylaxis: sub q heparin pending hemoccult  Disposition: pending further evaluaton  Code Status:Full Code    Patient Active Problem List   Diagnosis Date Noted  . Colitis 11/26/2014   Past Medical History: Past Medical History  Diagnosis Date  . Hypertension   . Thyroid disease     Past Surgical History: Past Surgical History  Procedure Laterality Date  . Lung surgery    . Back surgery      Social History: History   Social History  . Marital Status:  Married    Spouse Name: N/A  . Number of Children: N/A  . Years of Education: N/A   Social History Main Topics  . Smoking status: Former Research scientist (life sciences)  . Smokeless tobacco: Never Used  . Alcohol Use: Yes     Comment: occas  . Drug Use: Not on file  . Sexual Activity: Not on file   Other Topics Concern  . None   Social History Narrative    Family History: Family History  Problem Relation Age of Onset  . Hypertension Mother   . Hypertension Other     Allergies: No Known Allergies  Current Facility-Administered Medications  Medication Dose Route Frequency Provider Last Rate Last Dose  . 0.9 %  sodium chloride infusion   Intravenous Continuous Deneise Lever, MD      . ciprofloxacin (CIPRO) IVPB 400 mg  400 mg Intravenous Once Nat Christen, MD      . ciprofloxacin (CIPRO) IVPB 400 mg  400 mg Intravenous Q12H Deneise Lever, MD      . heparin injection 5,000 Units  5,000 Units Subcutaneous 3 times per day Deneise Lever, MD      . metroNIDAZOLE (FLAGYL) IVPB 500 mg  500 mg Intravenous Once Nat Christen, MD 100 mL/hr at 11/26/14 1852 500 mg at 11/26/14 1852  . metroNIDAZOLE (FLAGYL) IVPB 500 mg  500 mg Intravenous Q8H Deneise Lever, MD      . ondansetron [facility]) tablet 4 mg  4 mg Oral Q6H PRN Micah Flesher  Ernestina Patches, MD       Or  . ondansetron [facility]) injection 4 mg  4 mg Intravenous Q6H PRN Deneise Lever, MD       Current Outpatient Prescriptions  Medication Sig Dispense Refill  . amLODipine (NORVASC) 10 MG tablet Take 10 mg by mouth every morning.  3  . cholecalciferol (VITAMIN D) 1000 UNITS tablet Take 2,000 Units by mouth daily.    . ciprofloxacin (CIPRO) 500 MG tablet Take 1 tablet (500 mg total) by mouth every 12 (twelve) hours. 20 tablet 0  . hydrochlorothiazide (HYDRODIURIL) 25 MG tablet Take 25 mg by mouth daily.  3  . levothyroxine (SYNTHROID, LEVOTHROID) 75 MCG tablet Take 75 mcg by mouth daily before breakfast.    . metroNIDAZOLE (FLAGYL) 500 MG tablet Take 1 tablet (500  mg total) by mouth 3 (three) times daily. 30 tablet 0  . naproxen sodium (ANAPROX) 220 MG tablet Take 440 mg by mouth 2 (two) times daily as needed.    . Omega-3 Fatty Acids (FISH OIL PO) Take 2 capsules by mouth daily.    . ondansetron (ZOFRAN ODT) 4 MG disintegrating tablet Take 1 tablet (4 mg total) by mouth every 8 (eight) hours as needed for nausea. 6 tablet 0  . QC LORATADINE ALLERGY RELIEF 10 MG tablet Take 10 mg by mouth daily as needed for allergies.   3  . HYDROcodone-acetaminophen (NORCO/VICODIN) 5-325 MG per tablet Take 1 tablet by mouth every 4 (four) hours as needed. (Patient not taking: Reported on 11/26/2014) 10 tablet 0   Review Of Systems: 12 point ROS negative except as noted above in HPI.  Physical Exam: Filed Vitals:   11/26/14 1834  BP: 113/71  Pulse: 76  Temp:   Resp: 16    General: alert and cooperative HEENT: PERRLA and extra ocular movement intact Heart: S1, S2 normal, no murmur, rub or gallop, regular rate and rhythm Lungs: clear to auscultation, no wheezes or rales and unlabored breathing Abdomen: + bowel sounds, + lower abd TTP  Extremities: extremities normal, atraumatic, no cyanosis or edema Skin:no rashes Neurology: normal without focal findings  Labs and Imaging: Lab Results  Component Value Date/Time   NA 132* 11/26/2014 04:28 PM   K 3.8 11/26/2014 04:28 PM   CL 99 11/26/2014 04:28 PM   CO2 27 11/26/2014 04:28 PM   BUN 7 11/26/2014 04:28 PM   CREATININE 0.96 11/26/2014 04:28 PM   GLUCOSE 141* 11/26/2014 04:28 PM   Lab Results  Component Value Date   WBC 7.5 11/26/2014   HGB 10.9* 11/26/2014   HCT 33.2* 11/26/2014   MCV 80.8 11/26/2014   PLT 380 11/26/2014   Urinalysis    Component Value Date/Time   COLORURINE YELLOW 11/26/2014 1608   APPEARANCEUR CLEAR 11/26/2014 1608   LABSPEC 1.015 11/26/2014 1608   PHURINE 7.0 11/26/2014 1608   GLUCOSEU NEGATIVE 11/26/2014 1608   HGBUR NEGATIVE 11/26/2014 1608   BILIRUBINUR NEGATIVE 11/26/2014  1608   KETONESUR NEGATIVE 11/26/2014 1608   PROTEINUR NEGATIVE 11/26/2014 1608   UROBILINOGEN 0.2 11/26/2014 1608   NITRITE NEGATIVE 11/26/2014 1608   LEUKOCYTESUR NEGATIVE 11/26/2014 1608       No results found.         Shanda Howells MD  Pager: [phone]

## 2014-11-27 DIAGNOSIS — E039 Hypothyroidism, unspecified: Secondary | ICD-10-CM | POA: Diagnosis not present

## 2014-11-27 DIAGNOSIS — K5130 Ulcerative (chronic) rectosigmoiditis without complications: Secondary | ICD-10-CM | POA: Diagnosis present

## 2014-11-27 DIAGNOSIS — E43 Unspecified severe protein-calorie malnutrition: Secondary | ICD-10-CM | POA: Diagnosis not present

## 2014-11-27 DIAGNOSIS — I10 Essential (primary) hypertension: Secondary | ICD-10-CM | POA: Diagnosis not present

## 2014-11-27 DIAGNOSIS — Z6821 Body mass index (BMI) 21.0-21.9, adult: Secondary | ICD-10-CM | POA: Diagnosis not present

## 2014-11-27 DIAGNOSIS — E038 Other specified hypothyroidism: Secondary | ICD-10-CM | POA: Diagnosis not present

## 2014-11-27 DIAGNOSIS — K5190 Ulcerative colitis, unspecified, without complications: Secondary | ICD-10-CM | POA: Diagnosis not present

## 2014-11-27 DIAGNOSIS — K529 Noninfective gastroenteritis and colitis, unspecified: Secondary | ICD-10-CM | POA: Diagnosis present

## 2014-11-27 DIAGNOSIS — Z87891 Personal history of nicotine dependence: Secondary | ICD-10-CM | POA: Diagnosis not present

## 2014-11-27 LAB — CBC WITH DIFFERENTIAL/PLATELET
BASOS ABS: 0 10*3/uL (ref 0.0–0.1)
BASOS PCT: 0 % (ref 0–1)
Eosinophils Absolute: 0 10*3/uL (ref 0.0–0.7)
Eosinophils Relative: 0 % (ref 0–5)
HCT: 32.4 % — ABNORMAL LOW (ref 39.0–52.0)
Hemoglobin: 10.5 g/dL — ABNORMAL LOW (ref 13.0–17.0)
Lymphocytes Relative: 29 % (ref 12–46)
Lymphs Abs: 1.5 10*3/uL (ref 0.7–4.0)
MCH: 26.3 pg (ref 26.0–34.0)
MCHC: 32.4 g/dL (ref 30.0–36.0)
MCV: 81 fL (ref 78.0–100.0)
MONOS PCT: 4 % (ref 3–12)
Monocytes Absolute: 0.2 10*3/uL (ref 0.1–1.0)
Neutro Abs: 3.5 10*3/uL (ref 1.7–7.7)
Neutrophils Relative %: 67 % (ref 43–77)
Platelets: 375 10*3/uL (ref 150–400)
RBC: 4 MIL/uL — AB (ref 4.22–5.81)
RDW: 14.5 % (ref 11.5–15.5)
WBC: 5.2 10*3/uL (ref 4.0–10.5)

## 2014-11-27 LAB — COMPREHENSIVE METABOLIC PANEL
ALK PHOS: 46 U/L (ref 39–117)
ALT: 18 U/L (ref 0–53)
ANION GAP: 7 (ref 5–15)
AST: 19 U/L (ref 0–37)
Albumin: 2.2 g/dL — ABNORMAL LOW (ref 3.5–5.2)
BUN: 6 mg/dL (ref 6–23)
CO2: 24 mmol/L (ref 19–32)
CREATININE: 0.81 mg/dL (ref 0.50–1.35)
Calcium: 7.7 mg/dL — ABNORMAL LOW (ref 8.4–10.5)
Chloride: 102 mmol/L (ref 96–112)
GFR calc Af Amer: >90 mL/min (ref >90)
GFR calc non Af Amer: 89 mL/min — ABNORMAL LOW (ref >90)
GLUCOSE: 143 mg/dL — AB (ref 70–99)
Potassium: 4.1 mmol/L (ref 3.5–5.1)
Sodium: 133 mmol/L — ABNORMAL LOW (ref 135–145)
Total Bilirubin: 0.5 mg/dL (ref 0.3–1.2)
Total Protein: 5.7 g/dL — ABNORMAL LOW (ref 6.0–8.3)

## 2014-11-27 LAB — CLOSTRIDIUM DIFFICILE BY PCR: Toxigenic C. Difficile by PCR: NOT DETECTED

## 2014-11-27 MED ORDER — ZOLPIDEM TARTRATE 5 MG PO TABS
5.0000 mg | ORAL_TABLET | Freq: Once | ORAL | Status: AC
  Administered 2014-11-27: 5 mg via ORAL
  Filled 2014-11-27: qty 1

## 2014-11-27 MED ORDER — AMLODIPINE BESYLATE 5 MG PO TABS
10.0000 mg | ORAL_TABLET | Freq: Every morning | ORAL | Status: DC
  Administered 2014-11-28: 10 mg via ORAL
  Filled 2014-11-27: qty 2

## 2014-11-27 MED ORDER — LEVOTHYROXINE SODIUM 75 MCG PO TABS
75.0000 ug | ORAL_TABLET | Freq: Every day | ORAL | Status: DC
  Administered 2014-11-28 – 2014-11-30 (×3): 75 ug via ORAL
  Filled 2014-11-27 (×3): qty 1

## 2014-11-27 MED ORDER — MORPHINE SULFATE 2 MG/ML IJ SOLN
1.0000 mg | INTRAMUSCULAR | Status: DC | PRN
  Administered 2014-11-27: 1 mg via INTRAVENOUS
  Filled 2014-11-27: qty 1

## 2014-11-27 MED ORDER — LORATADINE 10 MG PO TABS: 10.0000 mg | ORAL_TABLET | Freq: Every day | ORAL | Status: DC | PRN

## 2014-11-27 MED ORDER — VITAMIN D 1000 UNITS PO TABS
2000.0000 [IU] | ORAL_TABLET | Freq: Every day | ORAL | Status: DC
  Administered 2014-11-28 – 2014-11-30 (×3): 2000 [IU] via ORAL
  Filled 2014-11-27 (×3): qty 2

## 2014-11-27 MED ORDER — BOOST / RESOURCE BREEZE PO LIQD
1.0000 | Freq: Two times a day (BID) | ORAL | Status: DC
  Administered 2014-11-27 – 2014-11-28 (×3): 1 via ORAL

## 2014-11-27 MED ORDER — MORPHINE SULFATE 2 MG/ML IJ SOLN
2.0000 mg | Freq: Once | INTRAMUSCULAR | Status: AC
  Administered 2014-11-27: 2 mg via INTRAVENOUS
  Filled 2014-11-27: qty 1

## 2014-11-27 NOTE — Progress Notes (Signed)
[facility] PROGRESS NOTE  Jerome Stark RFX:[id number] DOB: October 19, 1945 DOA: 11/26/2014 PCP: Pcp Not In System  Assessment/Plan: 1. Colitis. Presumed infectious. Continue on ciprofloxacin and Flagyl. Stool C. difficile is negative. Stool culture has been ordered. Fecal lactoferrin in process. Patient is feeling somewhat improved today. Start the patient on clear liquids and advance as tolerated. 2. Hypertension. Continue amlodipine 3. Hypothyroidism. Continue Synthroid  Code Status: full code  Family Communication: discussed with patient Disposition Plan: discharge home when improved   Consultants:    Procedures:    Antibiotics:  cipro 4/24>>  Flagyl 4/24>>  HPI/Subjective: Feeling better since admission, no nausea or vomiting at this time. Abdominal pain feeling better  Objective: Filed Vitals:   11/27/14 0631  BP: 112/65  Pulse: 78  Temp: 98 F (36.7 C)  Resp: 16    Intake/Output Summary (Last 24 hours) at 11/27/14 1209 Last data filed at 11/27/14 1022  Gross per 24 hour  Intake      0 ml  Output    152 ml  Net   -152 ml   Filed Weights   11/26/14 1546 11/26/14 2156  Weight: 72.576 kg (160 lb) 73.256 kg (161 lb 8 oz)    Exam:   General:  NAD  Cardiovascular: s1, s2, rrr  Respiratory: cta b  Abdomen: soft, tender in left abdomen, bs+  Musculoskeletal: no edema b/l   Data Reviewed: Basic Metabolic Panel:  Recent Labs Lab 11/26/14 1628 11/27/14 0607  NA 132* 133*  K 3.8 4.1  CL 99 102  CO2 27 24  GLUCOSE 141* 143*  BUN 7 6  CREATININE 0.96 0.81  CALCIUM 7.8* 7.7*   Liver Function Tests:  Recent Labs Lab 11/26/14 1628 11/27/14 0607  AST 18 19  ALT 16 18  ALKPHOS 51 46  BILITOT 0.4 0.5  PROT 6.0 5.7*  ALBUMIN 2.3* 2.2*    Recent Labs Lab 11/26/14 1628  LIPASE 29   No results for input(s): AMMONIA in the last 168 hours. CBC:  Recent Labs Lab 11/26/14 1628 11/27/14 0607  WBC 7.5 5.2  NEUTROABS 3.7 3.5  HGB  10.9* 10.5*  HCT 33.2* 32.4*  MCV 80.8 81.0  PLT 380 375   Cardiac Enzymes: No results for input(s): CKTOTAL, CKMB, CKMBINDEX, TROPONINI in the last 168 hours. BNP (last 3 results) No results for input(s): BNP in the last 8760 hours.  ProBNP (last 3 results) No results for input(s): PROBNP in the last 8760 hours.  CBG: No results for input(s): GLUCAP in the last 168 hours.  Recent Results (from the past 240 hour(s))  Clostridium Difficile by PCR     Status: None (Preliminary result)   Collection Time: 11/24/14 11:49 AM  Result Value Ref Range Status   C difficile by pcr  Not Detected Preliminary  Stool culture     Status: None (Preliminary result)   Collection Time: 11/24/14 11:49 AM  Result Value Ref Range Status   Preliminary Report Moderate Yeast  Preliminary  Clostridium Difficile by PCR     Status: None   Collection Time: 11/26/14 10:17 PM  Result Value Ref Range Status   C difficile by pcr NEGATIVE NEGATIVE Final     Studies: Ct Abdomen Pelvis W Contrast  11/26/2014   CLINICAL DATA:  69 year old male with 2 week history of generalized malaise, abdominal pain and diarrhea. Recent CT imaging demonstrated diffuse colitis.  EXAM: CT ABDOMEN AND PELVIS WITH CONTRAST  TECHNIQUE: Multidetector CT imaging of the abdomen and pelvis  was performed using the standard protocol following bolus administration of intravenous contrast.  CONTRAST:  36m OMNIPAQUE IOHEXOL 300 MG/ML SOLN, 1064mOMNIPAQUE IOHEXOL 300 MG/ML SOLN  COMPARISON:  Recent prior CT abdomen/ pelvis 11/20/2014  FINDINGS: Lower Chest: The lung bases are clear. Visualized cardiac structures are within normal limits for size. No pericardial effusion. Unremarkable visualized distal thoracic esophagus.  Abdomen: Unremarkable CT appearance of the stomach, duodenum (save for small D3 duodenum diverticula) spleen, adrenal glands and pancreas. Normal hepatic contour and morphology. No discrete hepatic lesion. Gallbladder is  unremarkable. No intra or extrahepatic biliary ductal dilatation.  Unremarkable appearance of the bilateral kidneys. No focal solid lesion, hydronephrosis or nephrolithiasis. Similar appearance of a submucosal edema and haziness involving the wall of the distal transverse colon at splenic flexure and extending throughout the descending and sigmoid colon. Inflammatory stranding in the pericolonic fat appears to have slightly improved since the recent prior study. No evidence of interval obstruction or perforation. Normal appendix in the right lower quadrant. No free fluid or suspicious adenopathy.  Pelvis: Unremarkable bladder, prostate gland and seminal vesicles. No free fluid or suspicious adenopathy.  Bones/Soft Tissues: No acute fracture or aggressive appearing lytic or blastic osseous lesion.  Vascular: Atherosclerotic vascular disease without significant stenosis or aneurysmal dilatation.  IMPRESSION: 1. Slight interval improvement in imaging findings consistent with colitis extending from the distal transverse colon through the sigmoid colon. There is slightly less inflammatory stranding in the pericolonic fat compared to 11/20/2014. No evidence of perforation or free air. Differential considerations remain a primarily infectious versus inflammatory colitis. 2. Additional ancillary findings as above without significant interval change.   Electronically Signed   By: HeJacqulynn Cadet.D.   On: 11/26/2014 22:04    Scheduled Meds: . ciprofloxacin  400 mg Intravenous Q12H  . heparin  5,000 Units Subcutaneous 3 times per day  . metronidazole  500 mg Intravenous Q8H   Continuous Infusions: . sodium chloride 125 mL/hr at 11/27/14 1017    Active Problems:   Colitis    Time spent: 3042m    Jerome Stark  [facility] Pager [phone] 7PM-7AM, please contact night-coverage at [email or web address], password [email or web address]25/2016, 12:09 PM

## 2014-11-27 NOTE — Progress Notes (Signed)
UR completed 

## 2014-11-27 NOTE — Care Management Note (Addendum)
    Page 1 of 1   11/30/2014     2:38:36 PM CARE MANAGEMENT NOTE 11/30/2014  Patient:  Jerome Stark, Jerome Stark   Account Number:  [id number]  Date Initiated:  11/27/2014  Documentation initiated by:  CHILDRESS,JESSICA  Subjective/Objective Assessment:   Pt is from home, lives with wife. Pt independent at baseline. Pt has no DME's or [place] services. Pt plans to discharge home with self care. Pt active with [facility] and see's Dr. Primitivo Gauze at the [facility].     Action/Plan:   [facility] has been notified of admission. Will cont to follow.   Anticipated DC Date:  11/28/2014   Anticipated DC Plan:  [place]  CM consult      Choice offered to / List presented to:             Status of service:  Completed, signed off Medicare Important Message given?  YES (If response is "NO", the following Medicare IM given date fields will be blank) Date Medicare IM given:  11/30/2014 Medicare IM given by:  Jolene Provost Date Additional Medicare IM given:   Additional Medicare IM given by:    Discharge Disposition:  HOME/SELF CARE  Per UR Regulation:    If discussed at Long Length of Stay Meetings, dates discussed:    Comments:  11/30/2014 [place], RN, MSN, CM Pt discharging home today with self care. Debbie at [facility] notified of discharge date. No further CM needs. 11/27/2014 [place], RN, MSN, CM

## 2014-11-27 NOTE — Progress Notes (Signed)
Patient is requesting something to help him get some rest. Paged on-call MD, will follow new orders given and continue to monitor.

## 2014-11-27 NOTE — Progress Notes (Signed)
INITIAL NUTRITION ASSESSMENT  DOCUMENTATION CODES Per approved criteria  -Severe malnutrition in the context of acute illness or injury   Pt meets criteria for severe MALNUTRITION in the context of acute illness as evidenced by 6% wt loss < 30 days and </= 50% energy intake >/= 5 days.   INTERVENTION: Resource Breeze po BID, each supplement provides 250 kcal and 9 grams of protein   Diet is advancing as tolerated: recommend soft diet   NUTRITION DIAGNOSIS: Predicted suboptimal oral intake related to colitis as evidenced by  Nutrition hx and wt loss of 10# (6%) over past 2 weeks.  Goal: Pt to meet >/= 90% of their estimated nutrition needs    Monitor:  Diet advancement and tolerance, po intake, labs and wt trends   Reason for Assessment: Malnutrition Screen  69 y.o. male  ASSESSMENT: Pt reports abdominal pain, diarrhea started 10 days ago. He complains that "everything I tried to eat would go right through me". His home diet is regular.  Pt tolerating clear liquids and diet is advancing as tolerated.   He has severe acute wt loss (6% in 2 weeks) and meets criteria for malnutrition.  CT scan indicative of transverse, descending and sigmoid colitis per MD.   Abnormal labs: sodium-133 , glucose 143.   Height: Ht Readings from Last 1 Encounters:  11/26/14 5' 11"  (1.803 m)    Weight: Wt Readings from Last 1 Encounters:  11/26/14 161 lb 8 oz (73.256 kg)    Ideal Body Weight: 172# (78 kg)  % Ideal Body Weight: 91%  Wt Readings from Last 10 Encounters:  11/26/14 161 lb 8 oz (73.256 kg)  11/20/14 165 lb (74.844 kg)    Usual Body Weight: 170-175#  % Usual Body Weight: 94%  BMI:  Body mass index is 22.53 kg/(m^2). normal range  Estimated Nutritional Needs: Kcal: 2000-2200 Protein: 95-105 gr  Fluid: 2.0-2.2 liters daily  Skin: no issues noted  Diet Order: Clear Liquids   EDUCATION NEEDS: -No education needs identified at this time   Intake/Output Summary  (Last 24 hours) at 11/27/14 1331 Last data filed at 11/27/14 1022  Gross per 24 hour  Intake      0 ml  Output    152 ml  Net   -152 ml    Last BM: 4/25- (loose, bloody stool)  Labs:   Recent Labs Lab 11/26/14 1628 11/27/14 0607  NA 132* 133*  K 3.8 4.1  CL 99 102  CO2 27 24  BUN 7 6  CREATININE 0.96 0.81  CALCIUM 7.8* 7.7*  GLUCOSE 141* 143*    CBG (last 3)  No results for input(s): GLUCAP in the last 72 hours.  Scheduled Meds: . ciprofloxacin  400 mg Intravenous Q12H  . heparin  5,000 Units Subcutaneous 3 times per day  . metronidazole  500 mg Intravenous Q8H    Continuous Infusions: . sodium chloride 125 mL/hr at 11/27/14 1017    Past Medical History  Diagnosis Date  . Hypertension   . Thyroid disease     Past Surgical History  Procedure Laterality Date  . Lung surgery    . Back surgery      Colman Cater MS,RD,CSG,LDN Office: #[phone] Pager: [phone]

## 2014-11-27 NOTE — Progress Notes (Signed)
Patient has complaint of back pain. Paged on-call MD, will follow any new orders given and continue to monitor the patient.

## 2014-11-28 ENCOUNTER — Encounter (HOSPITAL_COMMUNITY): Payer: Self-pay | Admitting: Gastroenterology

## 2014-11-28 DIAGNOSIS — E43 Unspecified severe protein-calorie malnutrition: Secondary | ICD-10-CM

## 2014-11-28 LAB — COMPREHENSIVE METABOLIC PANEL
ALK PHOS: 48 U/L (ref 39–117)
ALT: 22 U/L (ref 0–53)
AST: 22 U/L (ref 0–37)
Albumin: 2.4 g/dL — ABNORMAL LOW (ref 3.5–5.2)
Anion gap: 5 (ref 5–15)
BILIRUBIN TOTAL: 0.3 mg/dL (ref 0.3–1.2)
BUN: 5 mg/dL — ABNORMAL LOW (ref 6–23)
CALCIUM: 7.9 mg/dL — AB (ref 8.4–10.5)
CO2: 28 mmol/L (ref 19–32)
Chloride: 107 mmol/L (ref 96–112)
Creatinine, Ser: 0.88 mg/dL (ref 0.50–1.35)
GFR, EST NON AFRICAN AMERICAN: 86 mL/min — AB (ref >90)
GLUCOSE: 108 mg/dL — AB (ref 70–99)
Potassium: 3.9 mmol/L (ref 3.5–5.1)
Sodium: 140 mmol/L (ref 135–145)
Total Protein: 6.1 g/dL (ref 6.0–8.3)

## 2014-11-28 LAB — CBC WITH DIFFERENTIAL/PLATELET
BASOS ABS: 0 10*3/uL (ref 0.0–0.1)
BASOS PCT: 0 % (ref 0–1)
EOS PCT: 2 % (ref 0–5)
Eosinophils Absolute: 0.2 10*3/uL (ref 0.0–0.7)
HCT: 35.4 % — ABNORMAL LOW (ref 39.0–52.0)
Hemoglobin: 11.3 g/dL — ABNORMAL LOW (ref 13.0–17.0)
LYMPHS PCT: 33 % (ref 12–46)
Lymphs Abs: 3.1 10*3/uL (ref 0.7–4.0)
MCH: 26.2 pg (ref 26.0–34.0)
MCHC: 31.9 g/dL (ref 30.0–36.0)
MCV: 81.9 fL (ref 78.0–100.0)
MONO ABS: 1.1 10*3/uL — AB (ref 0.1–1.0)
Monocytes Relative: 12 % (ref 3–12)
NEUTROS ABS: 5.1 10*3/uL (ref 1.7–7.7)
Neutrophils Relative %: 53 % (ref 43–77)
PLATELETS: 419 10*3/uL — AB (ref 150–400)
RBC: 4.32 MIL/uL (ref 4.22–5.81)
RDW: 14.4 % (ref 11.5–15.5)
WBC: 9.5 10*3/uL (ref 4.0–10.5)

## 2014-11-28 LAB — STOOL CULTURE

## 2014-11-28 LAB — FECAL LACTOFERRIN, QUANT: Fecal Lactoferrin: POSITIVE

## 2014-11-28 LAB — OVA AND PARASITE EXAMINATION

## 2014-11-28 LAB — HEMOGLOBIN A1C
Hgb A1c MFr Bld: 5.7 % — ABNORMAL HIGH (ref 4.8–5.6)
Mean Plasma Glucose: 117 mg/dL

## 2014-11-28 MED ORDER — PEG 3350-KCL-NA BICARB-NACL 420 G PO SOLR
2000.0000 mL | Freq: Once | ORAL | Status: AC
  Administered 2014-11-28: 2000 mL via ORAL
  Filled 2014-11-28: qty 4000

## 2014-11-28 MED ORDER — SODIUM CHLORIDE 0.9 % IV SOLN
INTRAVENOUS | Status: DC
  Administered 2014-11-29: 14:00:00 via INTRAVENOUS

## 2014-11-28 MED ORDER — PEG 3350-KCL-NA BICARB-NACL 420 G PO SOLR
2000.0000 mL | Freq: Once | ORAL | Status: AC
  Administered 2014-11-29: 2000 mL via ORAL

## 2014-11-28 NOTE — Progress Notes (Signed)
Quick Note:  Negative. Patient is currently inpatient at [facility]. ______

## 2014-11-28 NOTE — Progress Notes (Addendum)
[facility] PROGRESS NOTE  Jerome Stark GQQ:[id number] DOB: 03/04/1946 DOA: 11/26/2014 PCP: Pcp Not In System  Summary:  This is a 69 year old patient who presents to the emergency room with abdominal pain, diarrhea and nausea. He was seen in the emergency room approximately one week prior to this admission and was found to have colitis. He was discharged home on ciprofloxacin and Flagyl for 2 weeks. The patient had taken 7 days of oral antibiotic therapy and reports that his symptoms were getting worse. He came back to the emergency room for evaluation where CT scan showed persistent/improving colitis. Since he had persistent symptoms, he was admitted to the hospital for further treatments.  Assessment/Plan: 1. Colitis. Presumed infectious. Continue on ciprofloxacin and Flagyl. Stool C. difficile is negative. Stool culture is in process. Fecal lactoferrin in positive. Patient had a repeat CT scan of the abdomen pelvis on admission that showed improving colitis, although he continues to have symptoms. Stool Hemoccult was noted to be positive, but hemoglobin has been stable. Patient feels worse today and reports increase in loose stools. Continue the patient on clear liquids and advance as tolerated. Since he is failing to improve, will request GI consult for any further recommendations 2. Hypertension. Stable. Continue amlodipine 3. Hypothyroidism. Continue Synthroid  Code Status: full code  Family Communication: discussed with patient and wife Disposition Plan: discharge home when improved   Consultants:    Procedures:    Antibiotics:  cipro 4/24>>  Flagyl 4/24>>  HPI/Subjective: Reports that he feels worse today. Increase in loose stools which are now dark colored.  Objective: Filed Vitals:   11/28/14 0543  BP: 103/65  Pulse: 64  Temp: 98.1 F (36.7 C)  Resp: 18    Intake/Output Summary (Last 24 hours) at 11/28/14 1135 Last data filed at 11/27/14 1815  Gross per  24 hour  Intake 1646.25 ml  Output      0 ml  Net 1646.25 ml   Filed Weights   11/26/14 1546 11/26/14 2156  Weight: 72.576 kg (160 lb) 73.256 kg (161 lb 8 oz)    Exam:   General:  NAD, sitting up in bed  Cardiovascular: s1, s2, rrr  Respiratory: clear bilaterally  Abdomen: soft, tender in left abdomen, bs+  Musculoskeletal: no edema b/l   Data Reviewed: Basic Metabolic Panel:  Recent Labs Lab 11/26/14 1628 11/27/14 0607 11/28/14 0610  NA 132* 133* 140  K 3.8 4.1 3.9  CL 99 102 107  CO2 27 24 28   GLUCOSE 141* 143* 108*  BUN 7 6 5*  CREATININE 0.96 0.81 0.88  CALCIUM 7.8* 7.7* 7.9*   Liver Function Tests:  Recent Labs Lab 11/26/14 1628 11/27/14 0607 11/28/14 0610  AST 18 19 22   ALT 16 18 22   ALKPHOS 51 46 48  BILITOT 0.4 0.5 0.3  PROT 6.0 5.7* 6.1  ALBUMIN 2.3* 2.2* 2.4*    Recent Labs Lab 11/26/14 1628  LIPASE 29   No results for input(s): AMMONIA in the last 168 hours. CBC:  Recent Labs Lab 11/26/14 1628 11/27/14 0607 11/28/14 0610  WBC 7.5 5.2 9.5  NEUTROABS 3.7 3.5 5.1  HGB 10.9* 10.5* 11.3*  HCT 33.2* 32.4* 35.4*  MCV 80.8 81.0 81.9  PLT 380 375 419*   Cardiac Enzymes: No results for input(s): CKTOTAL, CKMB, CKMBINDEX, TROPONINI in the last 168 hours. BNP (last 3 results) No results for input(s): BNP in the last 8760 hours.  ProBNP (last 3 results) No results for input(s): PROBNP in the  last 8760 hours.  CBG: No results for input(s): GLUCAP in the last 168 hours.  Recent Results (from the past 240 hour(s))  Clostridium Difficile by PCR     Status: None   Collection Time: 11/24/14 11:49 AM  Result Value Ref Range Status   C difficile by pcr Not Detected Not Detected Final    Comment: This test is for use only with liquid or soft stools; performance characteristics of other clinical specimen types have not been established.   This assay was performed by Cepheid GeneXpert(R) PCR. The performance characteristics of this  assay have been determined by [facility]. Performance characteristics refer to the analytical performance of the test.   Stool culture     Status: None   Collection Time: 11/24/14 11:49 AM  Result Value Ref Range Status   Organism ID, Bacteria No Salmonella,Shigella,Campylobacter,Yersinia,or  Final    Comment: Reduced Normal Flora present   Organism ID, Bacteria No E.coli [id number] isolated.  Final   Organism ID, Bacteria Moderate Yeast  Final  Clostridium Difficile by PCR     Status: None   Collection Time: 11/26/14 10:17 PM  Result Value Ref Range Status   C difficile by pcr NEGATIVE NEGATIVE Final  Stool culture     Status: None (Preliminary result)   Collection Time: 11/26/14 10:17 PM  Result Value Ref Range Status   Specimen Description STOOL  Final   Special Requests NONE  Final   Culture   Final    Culture reincubated for better growth Performed at [facility]    Report Status PENDING  Incomplete     Studies: Ct Abdomen Pelvis W Contrast  11/26/2014   CLINICAL DATA:  69 year old male with 2 week history of generalized malaise, abdominal pain and diarrhea. Recent CT imaging demonstrated diffuse colitis.  EXAM: CT ABDOMEN AND PELVIS WITH CONTRAST  TECHNIQUE: Multidetector CT imaging of the abdomen and pelvis was performed using the standard protocol following bolus administration of intravenous contrast.  CONTRAST:  57m OMNIPAQUE IOHEXOL 300 MG/ML SOLN, 1069mOMNIPAQUE IOHEXOL 300 MG/ML SOLN  COMPARISON:  Recent prior CT abdomen/ pelvis 11/20/2014  FINDINGS: Lower Chest: The lung bases are clear. Visualized cardiac structures are within normal limits for size. No pericardial effusion. Unremarkable visualized distal thoracic esophagus.  Abdomen: Unremarkable CT appearance of the stomach, duodenum (save for small D3 duodenum diverticula) spleen, adrenal glands and pancreas. Normal hepatic contour and morphology. No discrete hepatic lesion. Gallbladder is  unremarkable. No intra or extrahepatic biliary ductal dilatation.  Unremarkable appearance of the bilateral kidneys. No focal solid lesion, hydronephrosis or nephrolithiasis. Similar appearance of a submucosal edema and haziness involving the wall of the distal transverse colon at splenic flexure and extending throughout the descending and sigmoid colon. Inflammatory stranding in the pericolonic fat appears to have slightly improved since the recent prior study. No evidence of interval obstruction or perforation. Normal appendix in the right lower quadrant. No free fluid or suspicious adenopathy.  Pelvis: Unremarkable bladder, prostate gland and seminal vesicles. No free fluid or suspicious adenopathy.  Bones/Soft Tissues: No acute fracture or aggressive appearing lytic or blastic osseous lesion.  Vascular: Atherosclerotic vascular disease without significant stenosis or aneurysmal dilatation.  IMPRESSION: 1. Slight interval improvement in imaging findings consistent with colitis extending from the distal transverse colon through the sigmoid colon. There is slightly less inflammatory stranding in the pericolonic fat compared to 11/20/2014. No evidence of perforation or free air. Differential considerations remain a primarily infectious versus inflammatory colitis.  2. Additional ancillary findings as above without significant interval change.   Electronically Signed   By: Jacqulynn Cadet M.D.   On: 11/26/2014 22:04    Scheduled Meds: . amLODipine  10 mg Oral q morning - 10a  . cholecalciferol  2,000 Units Oral Daily  . ciprofloxacin  400 mg Intravenous Q12H  . feeding supplement (RESOURCE BREEZE)  1 Container Oral BID BM  . heparin  5,000 Units Subcutaneous 3 times per day  . levothyroxine  75 mcg Oral QAC breakfast  . metronidazole  500 mg Intravenous Q8H   Continuous Infusions: . sodium chloride 125 mL/hr at 11/28/14 0402    Active Problems:   Colitis   Hypothyroidism   Benign essential HTN    Protein-calorie malnutrition, severe    Time spent: 74mns    Karthikeya Funke  [facility] Pager [phone] If 7PM-7AM, please contact night-coverage at [email or web address], password [email or web address]4/26/2016, 11:35 AM  LOS: 1 day

## 2014-11-28 NOTE — Consult Note (Signed)
Referring Provider: Dr. Roderic Palau Primary Care Physician:  Pcp Not In System Primary Gastroenterologist:  Dr. Britta Mccreedy but would like to establish care again with [facility], Dr. Gala Romney   Date of Admission: 11/26/14 Date of Consultation: 11/28/14  Reason for Consultation:  Colitis   HPI:  Jerome Stark is a 69 y.o. year old male who presented with colitis. Seen in the ED on 4/18 with acute on chronic diarrhea. Provided Cipro and Flagyl. CT at that time showed significant wall thickening involving the distal transverse, descending, sigmoid colon and rectum. Symptoms persisted prompting return to the ED on the 24th, where he was admitted. 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 Britta Mccreedy in March 2015 with a diminutive hyperplastic polyp. Does not appear that the TI was intubated, and there were no random biopsies performed. Repeat CT scan on the 24th with slight improvement in colitis.   He notes chronic diarrhea, with symptoms worsening over the past year. Severity and frequency worsened over past few weeks. Given suppositories as an outpatient by PCP but doesn't remember name. Told by Dr. Britta Mccreedy to take anti-diarrheal medication, which just blocked everything up. Postprandial loose stool. Started skipping lunch. Has had intentional weight loss of 10 lbs originally but has lost 30 over past year. 10 in the past week. Bloody stool. Antibiotics for tick bite in the late fall. Intermittent abdominal cramping. From 6 am till now has gone 6-7 times. Has noticed some mild improvement overall with antibiotics. Was having 10-12 loose stools per day prior to admission. Feels he had fever/chills prior to admission. HOWEVER, never took temperature. Takes Aleve occasionally but none in 3 weeks. Lower abdominal discomfort. Tolerating clear liquids. No N/V currently but states when first admitted would feel nauseated. No sick contacts. Thus far, no  growth on stool culture, negative Cdiff PCR, positive lactoferrin.   Past Medical History  Diagnosis Date  . Hypertension   . Thyroid disease     Past Surgical History  Procedure Laterality Date  . Lung surgery    . Back surgery    . 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 Colonoscopy  March 2015    Dr. Britta Mccreedy: diminutive hyperplastic polyp. scope advanced to the cecum, no intubation of TI, no random biopsies performed    Prior to Admission medications   Medication Sig Start Date End Date Taking? Authorizing Provider  amLODipine (NORVASC) 10 MG tablet Take 10 mg by mouth every morning. 09/11/14  Yes Historical Provider, MD  cholecalciferol (VITAMIN D) 1000 UNITS tablet Take 2,000 Units by mouth daily.   Yes Historical Provider, MD  ciprofloxacin (CIPRO) 500 MG tablet Take 1 tablet (500 mg total) by mouth every 12 (twelve) hours. 11/20/14  Yes Tanna Furry, MD  hydrochlorothiazide (HYDRODIURIL) 25 MG tablet Take 25 mg by mouth daily. 09/11/14  Yes Historical Provider, MD  levothyroxine (SYNTHROID, LEVOTHROID) 75 MCG tablet Take 75 mcg by mouth daily before breakfast.   Yes Historical Provider, MD  metroNIDAZOLE (FLAGYL) 500 MG tablet Take 1 tablet (500 mg total) by mouth 3 (three) times daily. 11/20/14  Yes Tanna Furry, MD  naproxen sodium (ANAPROX) 220 MG tablet Take 440 mg by mouth 2 (two) times daily as needed.   Yes Historical Provider, MD  Omega-3 Fatty Acids (FISH OIL PO) Take 2 capsules by mouth daily.   Yes Historical Provider, MD  ondansetron (ZOFRAN ODT)  4 MG disintegrating tablet Take 1 tablet (4 mg total) by mouth every 8 (eight) hours as needed for nausea. 11/20/14  Yes Tanna Furry, MD  QC LORATADINE ALLERGY RELIEF 10 MG tablet Take 10 mg by mouth daily as needed for allergies.  09/11/14  Yes Historical Provider, MD  HYDROcodone-acetaminophen (NORCO/VICODIN) 5-325 MG per tablet Take 1 tablet by mouth every 4 (four) hours as needed. Patient not taking: Reported on  11/26/2014 11/20/14   Tanna Furry, MD    Current Facility-Administered Medications  Medication Dose Route Frequency Provider Last Rate Last Dose  . 0.9 %  sodium chloride infusion   Intravenous Continuous Deneise Lever, MD 125 mL/hr at 11/28/14 0402    . amLODipine (NORVASC) tablet 10 mg  10 mg Oral q morning - 10a Kathie Dike, MD   10 mg at 11/28/14 1002  . cholecalciferol (VITAMIN D) tablet 2,000 Units  2,000 Units Oral Daily Kathie Dike, MD   2,000 Units at 11/28/14 1002  . ciprofloxacin (CIPRO) IVPB 400 mg  400 mg Intravenous Q12H Deneise Lever, MD 200 mL/hr at 11/28/14 1002 400 mg at 11/28/14 1002  . feeding supplement (RESOURCE BREEZE) (RESOURCE BREEZE) liquid 1 Container  1 Container Oral BID BM Jeneen Rinks, RD   1 Container at 11/28/14 1002  . heparin injection 5,000 Units  5,000 Units Subcutaneous 3 times per day Deneise Lever, MD   5,000 Units at 11/27/14 2306  . levothyroxine (SYNTHROID, LEVOTHROID) tablet 75 mcg  75 mcg Oral QAC breakfast Kathie Dike, MD   75 mcg at 11/28/14 1002  . loratadine (CLARITIN) tablet 10 mg  10 mg Oral Daily PRN Kathie Dike, MD      . metroNIDAZOLE (FLAGYL) IVPB 500 mg  500 mg Intravenous Q8H Deneise Lever, MD 100 mL/hr at 11/28/14 1247 500 mg at 11/28/14 1247  . morphine 2 MG/ML injection 1 mg  1 mg Intravenous Q4H PRN Dianne Dun, NP   1 mg at 11/27/14 0028  . ondansetron (ZOFRAN) tablet 4 mg  4 mg Oral Q6H PRN Deneise Lever, MD       Or  . ondansetron [facility]) injection 4 mg  4 mg Intravenous Q6H PRN Deneise Lever, MD        Allergies as of 11/26/2014  . (No Known Allergies)    Family History  Problem Relation Age of Onset  . Hypertension Mother   . Hypertension Other   . Colon cancer Neg Hx     History   Social History  . Marital Status: Married    Spouse Name: N/A  . Number of Children: N/A  . Years of Education: N/A   Occupational History  . Not on file.   Social History Main Topics  . Smoking  status: Former Research scientist (life sciences)  . Smokeless tobacco: Never Used  . Alcohol Use: 0.0 oz/week    0 Standard drinks or equivalent per week     Comment: occasional wine  . Drug Use: Not on file  . Sexual Activity: Not on file   Other Topics Concern  . Not on file   Social History Narrative    Review of Systems: As mentioned in HPI  Physical Exam: Vital signs in last 24 hours: Temp:  [97.9 F (36.6 C)-98.2 F (36.8 C)] 98.2 F (36.8 C) (04/26 1315) Pulse Rate:  [64-84] 74 (04/26 1315) Resp:  [16-18] 18 (04/26 1315) BP: (103-118)/(52-92) 106/62 mmHg (04/26 1315) SpO2:  [98 %-100 %] 98 % (04/26  1315) Last BM Date: 11/27/14 General:   Alert,  Well-developed, well-nourished, pleasant and cooperative in NAD Head:  Normocephalic and atraumatic. Eyes:  Sclera clear, no icterus.   Conjunctiva pink. Ears:  Normal auditory acuity. Nose:  No deformity, discharge,  or lesions. Mouth:  No deformity or lesions, dentition normal. Lungs:  Clear throughout to auscultation.   No wheezes, crackles, or rhonchi. No acute distress. Heart:  Regular rate and rhythm; no murmurs, clicks, rubs,  or gallops. Abdomen:  Soft, nontender and nondistended. No masses, hepatosplenomegaly or hernias noted. Normal bowel sounds, without guarding, and without rebound.   Rectal:  Deferred until time of colonoscopy.   Msk:  Symmetrical without gross deformities. Normal posture. Extremities:  Without edema. Neurologic:  Alert and  oriented x4;  grossly normal neurologically. Skin:  Intact without significant lesions or rashes. Psych:  Alert and cooperative. Normal mood and affect.  Intake/Output from previous day: 04/25 0701 - 04/26 0700 In: 1646.3 [P.O.:240; I.V.:1406.3] Out: 150 [Stool:150] Intake/Output this shift: Total I/O In: 240 [P.O.:240] Out: -   Lab Results:  Recent Labs  11/26/14 1628 11/27/14 0607 11/28/14 0610  WBC 7.5 5.2 9.5  HGB 10.9* 10.5* 11.3*  HCT 33.2* 32.4* 35.4*  PLT 380 375 419*    BMET  Recent Labs  11/26/14 1628 11/27/14 0607 11/28/14 0610  NA 132* 133* 140  K 3.8 4.1 3.9  CL 99 102 107  CO2 27 24 28   GLUCOSE 141* 143* 108*  BUN 7 6 5*  CREATININE 0.96 0.81 0.88  CALCIUM 7.8* 7.7* 7.9*   LFT  Recent Labs  11/26/14 1628 11/27/14 0607 11/28/14 0610  PROT 6.0 5.7* 6.1  ALBUMIN 2.3* 2.2* 2.4*  AST 18 19 22   ALT 16 18 22   ALKPHOS 51 46 48  BILITOT 0.4 0.5 0.3    Studies/Results: Ct Abdomen Pelvis W Contrast  11/26/2014   CLINICAL DATA:  69 year old male with 2 week history of generalized malaise, abdominal pain and diarrhea. Recent CT imaging demonstrated diffuse colitis.  EXAM: CT ABDOMEN AND PELVIS WITH CONTRAST  TECHNIQUE: Multidetector CT imaging of the abdomen and pelvis was performed using the standard protocol following bolus administration of intravenous contrast.  CONTRAST:  76m OMNIPAQUE IOHEXOL 300 MG/ML SOLN, 102mOMNIPAQUE IOHEXOL 300 MG/ML SOLN  COMPARISON:  Recent prior CT abdomen/ pelvis 11/20/2014  FINDINGS: Lower Chest: The lung bases are clear. Visualized cardiac structures are within normal limits for size. No pericardial effusion. Unremarkable visualized distal thoracic esophagus.  Abdomen: Unremarkable CT appearance of the stomach, duodenum (save for small D3 duodenum diverticula) spleen, adrenal glands and pancreas. Normal hepatic contour and morphology. No discrete hepatic lesion. Gallbladder is unremarkable. No intra or extrahepatic biliary ductal dilatation.  Unremarkable appearance of the bilateral kidneys. No focal solid lesion, hydronephrosis or nephrolithiasis. Similar appearance of a submucosal edema and haziness involving the wall of the distal transverse colon at splenic flexure and extending throughout the descending and sigmoid colon. Inflammatory stranding in the pericolonic fat appears to have slightly improved since the recent prior study. No evidence of interval obstruction or perforation. Normal appendix in the right  lower quadrant. No free fluid or suspicious adenopathy.  Pelvis: Unremarkable bladder, prostate gland and seminal vesicles. No free fluid or suspicious adenopathy.  Bones/Soft Tissues: No acute fracture or aggressive appearing lytic or blastic osseous lesion.  Vascular: Atherosclerotic vascular disease without significant stenosis or aneurysmal dilatation.  IMPRESSION: 1. Slight interval improvement in imaging findings consistent with colitis extending from the distal transverse  colon through the sigmoid colon. There is slightly less inflammatory stranding in the pericolonic fat compared to 11/20/2014. No evidence of perforation or free air. Differential considerations remain a primarily infectious versus inflammatory colitis. 2. Additional ancillary findings as above without significant interval change.   Electronically Signed   By: Jacqulynn Cadet M.D.   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 Negative stool studies on file except for non-specific positive lactoferrin. He has been slow to respond to empiric antibiotics (Cipro and Flagyl); as of note, last colonoscopy by Dr. Britta Mccreedy March 2015 with hyperplastic polyp and no colonic biopsies. With history of proctitis and failure to improve with empiric antibiotics, recommend colonoscopy during this admission. Will plan on colonoscopy 4/27 with Dr. Gala Romney, using a split prep approach.   Plan: Golytely split prep dosing Tap water enema tomorrow Colonoscopy 4/27 with Dr. Gala Romney, late afternoon Continue empiric antibiotics for now.  Last dose of Heparin this evening, hold further doses until after colonoscopy.  Further recommendations after colonoscopy   Orvil Feil, ANP-BC [facility]     LOS: 1 day    11/28/2014, 1:42 PM   Attending note:  Patient seen and  examined. Database reviewed. 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 He needs a colonoscopy to nail down the diagnosis. We'll plan for tomorrow afternoon. Risks benefits, limitations and alternatives have been reviewed. Patient is agreeable. Further recommendations to follow.

## 2014-11-29 ENCOUNTER — Encounter (HOSPITAL_COMMUNITY): Payer: Self-pay | Admitting: *Deleted

## 2014-11-29 ENCOUNTER — Encounter (HOSPITAL_COMMUNITY)
Admission: EM | Disposition: A | Discharge status: Home / Self Care | Payer: Self-pay | Source: Home / Self Care | Attending: Internal Medicine

## 2014-11-29 DIAGNOSIS — E038 Other specified hypothyroidism: Secondary | ICD-10-CM

## 2014-11-29 DIAGNOSIS — K5190 Ulcerative colitis, unspecified, without complications: Secondary | ICD-10-CM | POA: Insufficient documentation

## 2014-11-29 HISTORY — PX: COLONOSCOPY: SHX5424

## 2014-11-29 LAB — COMPREHENSIVE METABOLIC PANEL
ALBUMIN: 2 g/dL — AB (ref 3.5–5.2)
ALT: 20 U/L (ref 0–53)
AST: 17 U/L (ref 0–37)
Alkaline Phosphatase: 49 U/L (ref 39–117)
Anion gap: 4 — ABNORMAL LOW (ref 5–15)
BILIRUBIN TOTAL: 0.3 mg/dL (ref 0.3–1.2)
BUN: <5 mg/dL — ABNORMAL LOW (ref 6–23)
CHLORIDE: 107 mmol/L (ref 96–112)
CO2: 27 mmol/L (ref 19–32)
Calcium: 7.8 mg/dL — ABNORMAL LOW (ref 8.4–10.5)
Creatinine, Ser: 0.85 mg/dL (ref 0.50–1.35)
GFR calc Af Amer: >90 mL/min (ref >90)
GFR calc non Af Amer: 88 mL/min — ABNORMAL LOW (ref >90)
GLUCOSE: 104 mg/dL — AB (ref 70–99)
Potassium: 3.6 mmol/L (ref 3.5–5.1)
SODIUM: 138 mmol/L (ref 135–145)
Total Protein: 5.1 g/dL — ABNORMAL LOW (ref 6.0–8.3)

## 2014-11-29 LAB — CBC WITH DIFFERENTIAL/PLATELET
Basophils Absolute: 0 10*3/uL (ref 0.0–0.1)
Basophils Relative: 1 % (ref 0–1)
Eosinophils Absolute: 0.2 10*3/uL (ref 0.0–0.7)
Eosinophils Relative: 2 % (ref 0–5)
HCT: 30.8 % — ABNORMAL LOW (ref 39.0–52.0)
Hemoglobin: 10.1 g/dL — ABNORMAL LOW (ref 13.0–17.0)
LYMPHS ABS: 2 10*3/uL (ref 0.7–4.0)
Lymphocytes Relative: 33 % (ref 12–46)
MCH: 26.7 pg (ref 26.0–34.0)
MCHC: 32.8 g/dL (ref 30.0–36.0)
MCV: 81.5 fL (ref 78.0–100.0)
Monocytes Absolute: 0.6 10*3/uL (ref 0.1–1.0)
Monocytes Relative: 9 % (ref 3–12)
NEUTROS PCT: 55 % (ref 43–77)
Neutro Abs: 3.4 10*3/uL (ref 1.7–7.7)
PLATELETS: 329 10*3/uL (ref 150–400)
RBC: 3.78 MIL/uL — AB (ref 4.22–5.81)
RDW: 14.6 % (ref 11.5–15.5)
WBC: 6.2 10*3/uL (ref 4.0–10.5)

## 2014-11-29 SURGERY — COLONOSCOPY
Anesthesia: Moderate Sedation

## 2014-11-29 MED ORDER — SIMETHICONE 40 MG/0.6ML PO SUSP
ORAL | Status: DC | PRN
  Administered 2014-11-29: 15:00:00

## 2014-11-29 MED ORDER — PREDNISONE 20 MG PO TABS
40.0000 mg | ORAL_TABLET | Freq: Every day | ORAL | Status: DC
  Administered 2014-11-29 – 2014-11-30 (×2): 40 mg via ORAL
  Filled 2014-11-29 (×4): qty 2

## 2014-11-29 MED ORDER — AMLODIPINE BESYLATE 5 MG PO TABS
5.0000 mg | ORAL_TABLET | Freq: Every morning | ORAL | Status: DC
  Administered 2014-11-29: 5 mg via ORAL
  Filled 2014-11-29: qty 1

## 2014-11-29 MED ORDER — ONDANSETRON HCL 4 MG/2ML IJ SOLN
INTRAMUSCULAR | Status: DC | PRN
  Administered 2014-11-29: 4 mg via INTRAVENOUS

## 2014-11-29 MED ORDER — MEPERIDINE HCL 100 MG/ML IJ SOLN
INTRAMUSCULAR | Status: AC
  Administered 2014-11-29: 14:00:00
  Filled 2014-11-29: qty 2

## 2014-11-29 MED ORDER — MESALAMINE 1.2 G PO TBEC
4.8000 g | DELAYED_RELEASE_TABLET | Freq: Every day | ORAL | Status: DC
  Administered 2014-11-29 – 2014-11-30 (×2): 4.8 g via ORAL
  Filled 2014-11-29 (×3): qty 4

## 2014-11-29 MED ORDER — ONDANSETRON HCL 4 MG/2ML IJ SOLN
INTRAMUSCULAR | Status: AC
  Administered 2014-11-29: 14:00:00
  Filled 2014-11-29: qty 2

## 2014-11-29 MED ORDER — MIDAZOLAM HCL 5 MG/5ML IJ SOLN
INTRAMUSCULAR | Status: AC
  Administered 2014-11-29: 14:00:00
  Filled 2014-11-29: qty 10

## 2014-11-29 MED ORDER — MEPERIDINE HCL 100 MG/ML IJ SOLN
INTRAMUSCULAR | Status: DC | PRN
  Administered 2014-11-29 (×2): 50 mg via INTRAVENOUS

## 2014-11-29 MED ORDER — MIDAZOLAM HCL 5 MG/5ML IJ SOLN
INTRAMUSCULAR | Status: DC | PRN
  Administered 2014-11-29 (×2): 2 mg via INTRAVENOUS
  Administered 2014-11-29 (×2): 1 mg via INTRAVENOUS

## 2014-11-29 NOTE — Interval H&P Note (Signed)
History and Physical Interval Note:  11/29/2014 3:05 PM  Jerome Stark  has presented today for surgery, with the diagnosis of colitis, rectal bleeding, hx of proctitis  The various methods of treatment have been discussed with the patient and family. After consideration of risks, benefits and other options for treatment, the patient has consented to  Procedure(s) with comments: COLONOSCOPY (N/A) - LATE AFTERNOON, AROUND 3  as a surgical intervention .  The patient's history has been reviewed, patient examined, no change in status, stable for surgery.  I have reviewed the patient's chart and labs.  Questions were answered to the patient's satisfaction.     Jerome Stark  Change. Colonoscopy per plan.The risks, benefits, limitations, alternatives and imponderables have been reviewed with the patient. Questions have been answered. All parties are agreeable.

## 2014-11-29 NOTE — Op Note (Addendum)
Jerome Stark, [id number]   COLONOSCOPY PROCEDURE REPORT  PATIENT: Jerome Stark, Jerome Stark  MR#: [id number] BIRTHDATE: 08/21/1945 , 69  yrs. old GENDER: male ENDOSCOPIST: R.  Garfield Cornea, MD FACP FACG REFERRED BY:  Clance Boll, NP, [facility] PROCEDURE DATE:  12/23/14 PROCEDURE:   Ileocolonoscopy with segmental biopsy INDICATIONS:    Bloody diarrhea, proctocolitis left sided on CT; complete stool studies negative for infection. No improvement with antibiotics. MEDICATIONS: Versed 6 mg IV and Demerol 100 mg IV in divided doses. Zofran 4 mg IV. ASA CLASS:       Class III  CONSENT: The risks, benefits, alternatives and imponderables including but not limited to bleeding, perforation as well as the possibility of a missed lesion have been reviewed.  The potential for biopsy, lesion removal, etc. have also been discussed. Questions have been answered.  All parties agreeable.  Please see the history and physical in the medical record for more information.  DESCRIPTION OF PROCEDURE:   After the risks benefits and alternatives of the procedure were thoroughly explained, informed consent was obtained.  The digital rectal exam      The EC-[id number] ([id number])  endoscope was introduced through the anus and advanced to the   . No adverse events experienced.   The quality of the prep was     The instrument was then slowly withdrawn as the colon was fully examined.    COLON FINDINGS: Diffusely abnormal rectal mucosa.  Erosions and geographic ulcerations present.  No pseudomembranes.  Complete loss of the normal vascular pattern.  With the degree of inflammation present, I elected not to attempt retroflexion.  The rectal mucosa was seen well on?"face.  These inflammatory changes extending confluently into the sigmoid colon and extended up to the area the splenic flexure beyond which the colonic mucosa appeared entirely normal all the way to the  cecum.  The distal 10 cm of terminal ileal mucosa also appeared normal.  Biopsies of the normal-appearing ascending colon were taken. Subsequently, biopsies the abnormal descending, sigmoid and rectal mucosa was taken for histologic study.  Retroflexion was not performed. .  Withdrawal time=11 minutes 0 seconds.  The scope was withdrawn and the procedure completed. COMPLICATIONS: There were no immediate complications.  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 Begin Lialda 4.8 g daily. Advance diet. Anticipate discharge within the next 24 hours with close interval follow-up in our office.  Follow up on pathology.  eSigned:  R. Garfield Cornea, MD Rosalita Chessman [facility] 2014-12-23 4:06 PM Revised: Dec 23, 2014 4:06 PM  cc:  CPT CODES: ICD CODES:  The ICD and CPT codes recommended by this software are interpretations from the data that the clinical staff has captured with the software.  The verification of the translation of this report to the ICD and CPT codes and modifiers is the sole responsibility of the health care institution and practicing physician where this report was generated.  [employer] will not be held responsible for the validity of the ICD and CPT codes included on this report.  AMA assumes no liability for data contained or not contained herein. CPT is a Designer, television/film set of the [employer].  PATIENT NAME:  Jerome Stark, Jerome Stark MR#: [id number]

## 2014-11-29 NOTE — H&P (View-Only) (Signed)
Referring Provider: Dr. Roderic Palau Primary Care Physician:  Pcp Not In System Primary Gastroenterologist:  Dr. Britta Mccreedy but would like to establish care again with [facility], Dr. Gala Romney   Date of Admission: 11/26/14 Date of Consultation: 11/28/14  Reason for Consultation:  Colitis   HPI:  Jerome Stark is a 70 y.o. year old male who presented with colitis. Seen in the ED on 4/18 with acute on chronic diarrhea. Provided Cipro and Flagyl. CT at that time showed significant wall thickening involving the distal transverse, descending, sigmoid colon and rectum. Symptoms persisted prompting return to the ED on the 24th, where he was admitted. 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 Britta Mccreedy in March 2015 with a diminutive hyperplastic polyp. Does not appear that the TI was intubated, and there were no random biopsies performed. Repeat CT scan on the 24th with slight improvement in colitis.   He notes chronic diarrhea, with symptoms worsening over the past year. Severity and frequency worsened over past few weeks. Given suppositories as an outpatient by PCP but doesn't remember name. Told by Dr. Britta Mccreedy to take anti-diarrheal medication, which just blocked everything up. Postprandial loose stool. Started skipping lunch. Has had intentional weight loss of 10 lbs originally but has lost 30 over past year. 10 in the past week. Bloody stool. Antibiotics for tick bite in the late fall. Intermittent abdominal cramping. From 6 am till now has gone 6-7 times. Has noticed some mild improvement overall with antibiotics. Was having 10-12 loose stools per day prior to admission. Feels he had fever/chills prior to admission. HOWEVER, never took temperature. Takes Aleve occasionally but none in 3 weeks. Lower abdominal discomfort. Tolerating clear liquids. No N/V currently but states when first admitted would feel nauseated. No sick contacts. Thus far, no  growth on stool culture, negative Cdiff PCR, positive lactoferrin.   Past Medical History  Diagnosis Date  . Hypertension   . Thyroid disease     Past Surgical History  Procedure Laterality Date  . Lung surgery    . Back surgery    . 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 Colonoscopy  March 2015    Dr. Britta Mccreedy: diminutive hyperplastic polyp. scope advanced to the cecum, no intubation of TI, no random biopsies performed    Prior to Admission medications   Medication Sig Start Date End Date Taking? Authorizing Provider  amLODipine (NORVASC) 10 MG tablet Take 10 mg by mouth every morning. 09/11/14  Yes Historical Provider, MD  cholecalciferol (VITAMIN D) 1000 UNITS tablet Take 2,000 Units by mouth daily.   Yes Historical Provider, MD  ciprofloxacin (CIPRO) 500 MG tablet Take 1 tablet (500 mg total) by mouth every 12 (twelve) hours. 11/20/14  Yes Tanna Furry, MD  hydrochlorothiazide (HYDRODIURIL) 25 MG tablet Take 25 mg by mouth daily. 09/11/14  Yes Historical Provider, MD  levothyroxine (SYNTHROID, LEVOTHROID) 75 MCG tablet Take 75 mcg by mouth daily before breakfast.   Yes Historical Provider, MD  metroNIDAZOLE (FLAGYL) 500 MG tablet Take 1 tablet (500 mg total) by mouth 3 (three) times daily. 11/20/14  Yes Tanna Furry, MD  naproxen sodium (ANAPROX) 220 MG tablet Take 440 mg by mouth 2 (two) times daily as needed.   Yes Historical Provider, MD  Omega-3 Fatty Acids (FISH OIL PO) Take 2 capsules by mouth daily.   Yes Historical Provider, MD  ondansetron (ZOFRAN ODT)  4 MG disintegrating tablet Take 1 tablet (4 mg total) by mouth every 8 (eight) hours as needed for nausea. 11/20/14  Yes Tanna Furry, MD  QC LORATADINE ALLERGY RELIEF 10 MG tablet Take 10 mg by mouth daily as needed for allergies.  09/11/14  Yes Historical Provider, MD  HYDROcodone-acetaminophen (NORCO/VICODIN) 5-325 MG per tablet Take 1 tablet by mouth every 4 (four) hours as needed. Patient not taking: Reported on  11/26/2014 11/20/14   Tanna Furry, MD    Current Facility-Administered Medications  Medication Dose Route Frequency Provider Last Rate Last Dose  . 0.9 %  sodium chloride infusion   Intravenous Continuous Deneise Lever, MD 125 mL/hr at 11/28/14 0402    . amLODipine (NORVASC) tablet 10 mg  10 mg Oral q morning - 10a Kathie Dike, MD   10 mg at 11/28/14 1002  . cholecalciferol (VITAMIN D) tablet 2,000 Units  2,000 Units Oral Daily Kathie Dike, MD   2,000 Units at 11/28/14 1002  . ciprofloxacin (CIPRO) IVPB 400 mg  400 mg Intravenous Q12H Deneise Lever, MD 200 mL/hr at 11/28/14 1002 400 mg at 11/28/14 1002  . feeding supplement (RESOURCE BREEZE) (RESOURCE BREEZE) liquid 1 Container  1 Container Oral BID BM Jeneen Rinks, RD   1 Container at 11/28/14 1002  . heparin injection 5,000 Units  5,000 Units Subcutaneous 3 times per day Deneise Lever, MD   5,000 Units at 11/27/14 2306  . levothyroxine (SYNTHROID, LEVOTHROID) tablet 75 mcg  75 mcg Oral QAC breakfast Kathie Dike, MD   75 mcg at 11/28/14 1002  . loratadine (CLARITIN) tablet 10 mg  10 mg Oral Daily PRN Kathie Dike, MD      . metroNIDAZOLE (FLAGYL) IVPB 500 mg  500 mg Intravenous Q8H Deneise Lever, MD 100 mL/hr at 11/28/14 1247 500 mg at 11/28/14 1247  . morphine 2 MG/ML injection 1 mg  1 mg Intravenous Q4H PRN Dianne Dun, NP   1 mg at 11/27/14 0028  . ondansetron (ZOFRAN) tablet 4 mg  4 mg Oral Q6H PRN Deneise Lever, MD       Or  . ondansetron [facility]) injection 4 mg  4 mg Intravenous Q6H PRN Deneise Lever, MD        Allergies as of 11/26/2014  . (No Known Allergies)    Family History  Problem Relation Age of Onset  . Hypertension Mother   . Hypertension Other   . Colon cancer Neg Hx     History   Social History  . Marital Status: Married    Spouse Name: N/A  . Number of Children: N/A  . Years of Education: N/A   Occupational History  . Not on file.   Social History Main Topics  . Smoking  status: Former Research scientist (life sciences)  . Smokeless tobacco: Never Used  . Alcohol Use: 0.0 oz/week    0 Standard drinks or equivalent per week     Comment: occasional wine  . Drug Use: Not on file  . Sexual Activity: Not on file   Other Topics Concern  . Not on file   Social History Narrative    Review of Systems: As mentioned in HPI  Physical Exam: Vital signs in last 24 hours: Temp:  [97.9 F (36.6 C)-98.2 F (36.8 C)] 98.2 F (36.8 C) (04/26 1315) Pulse Rate:  [64-84] 74 (04/26 1315) Resp:  [16-18] 18 (04/26 1315) BP: (103-118)/(52-92) 106/62 mmHg (04/26 1315) SpO2:  [98 %-100 %] 98 % (04/26  1315) Last BM Date: 11/27/14 General:   Alert,  Well-developed, well-nourished, pleasant and cooperative in NAD Head:  Normocephalic and atraumatic. Eyes:  Sclera clear, no icterus.   Conjunctiva pink. Ears:  Normal auditory acuity. Nose:  No deformity, discharge,  or lesions. Mouth:  No deformity or lesions, dentition normal. Lungs:  Clear throughout to auscultation.   No wheezes, crackles, or rhonchi. No acute distress. Heart:  Regular rate and rhythm; no murmurs, clicks, rubs,  or gallops. Abdomen:  Soft, nontender and nondistended. No masses, hepatosplenomegaly or hernias noted. Normal bowel sounds, without guarding, and without rebound.   Rectal:  Deferred until time of colonoscopy.   Msk:  Symmetrical without gross deformities. Normal posture. Extremities:  Without edema. Neurologic:  Alert and  oriented x4;  grossly normal neurologically. Skin:  Intact without significant lesions or rashes. Psych:  Alert and cooperative. Normal mood and affect.  Intake/Output from previous day: 04/25 0701 - 04/26 0700 In: 1646.3 [P.O.:240; I.V.:1406.3] Out: 150 [Stool:150] Intake/Output this shift: Total I/O In: 240 [P.O.:240] Out: -   Lab Results:  Recent Labs  11/26/14 1628 11/27/14 0607 11/28/14 0610  WBC 7.5 5.2 9.5  HGB 10.9* 10.5* 11.3*  HCT 33.2* 32.4* 35.4*  PLT 380 375 419*    BMET  Recent Labs  11/26/14 1628 11/27/14 0607 11/28/14 0610  NA 132* 133* 140  K 3.8 4.1 3.9  CL 99 102 107  CO2 27 24 28   GLUCOSE 141* 143* 108*  BUN 7 6 5*  CREATININE 0.96 0.81 0.88  CALCIUM 7.8* 7.7* 7.9*   LFT  Recent Labs  11/26/14 1628 11/27/14 0607 11/28/14 0610  PROT 6.0 5.7* 6.1  ALBUMIN 2.3* 2.2* 2.4*  AST 18 19 22   ALT 16 18 22   ALKPHOS 51 46 48  BILITOT 0.4 0.5 0.3    Studies/Results: Ct Abdomen Pelvis W Contrast  11/26/2014   CLINICAL DATA:  69 year old male with 2 week history of generalized malaise, abdominal pain and diarrhea. Recent CT imaging demonstrated diffuse colitis.  EXAM: CT ABDOMEN AND PELVIS WITH CONTRAST  TECHNIQUE: Multidetector CT imaging of the abdomen and pelvis was performed using the standard protocol following bolus administration of intravenous contrast.  CONTRAST:  63m OMNIPAQUE IOHEXOL 300 MG/ML SOLN, 1059mOMNIPAQUE IOHEXOL 300 MG/ML SOLN  COMPARISON:  Recent prior CT abdomen/ pelvis 11/20/2014  FINDINGS: Lower Chest: The lung bases are clear. Visualized cardiac structures are within normal limits for size. No pericardial effusion. Unremarkable visualized distal thoracic esophagus.  Abdomen: Unremarkable CT appearance of the stomach, duodenum (save for small D3 duodenum diverticula) spleen, adrenal glands and pancreas. Normal hepatic contour and morphology. No discrete hepatic lesion. Gallbladder is unremarkable. No intra or extrahepatic biliary ductal dilatation.  Unremarkable appearance of the bilateral kidneys. No focal solid lesion, hydronephrosis or nephrolithiasis. Similar appearance of a submucosal edema and haziness involving the wall of the distal transverse colon at splenic flexure and extending throughout the descending and sigmoid colon. Inflammatory stranding in the pericolonic fat appears to have slightly improved since the recent prior study. No evidence of interval obstruction or perforation. Normal appendix in the right  lower quadrant. No free fluid or suspicious adenopathy.  Pelvis: Unremarkable bladder, prostate gland and seminal vesicles. No free fluid or suspicious adenopathy.  Bones/Soft Tissues: No acute fracture or aggressive appearing lytic or blastic osseous lesion.  Vascular: Atherosclerotic vascular disease without significant stenosis or aneurysmal dilatation.  IMPRESSION: 1. Slight interval improvement in imaging findings consistent with colitis extending from the distal transverse  colon through the sigmoid colon. There is slightly less inflammatory stranding in the pericolonic fat compared to 11/20/2014. No evidence of perforation or free air. Differential considerations remain a primarily infectious versus inflammatory colitis. 2. Additional ancillary findings as above without significant interval change.   Electronically Signed   By: Jacqulynn Cadet M.D.   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 Negative stool studies on file except for non-specific positive lactoferrin. He has been slow to respond to empiric antibiotics (Cipro and Flagyl); as of note, last colonoscopy by Dr. Britta Mccreedy March 2015 with hyperplastic polyp and no colonic biopsies. With history of proctitis and failure to improve with empiric antibiotics, recommend colonoscopy during this admission. Will plan on colonoscopy 4/27 with Dr. Gala Romney, using a split prep approach.   Plan: Golytely split prep dosing Tap water enema tomorrow Colonoscopy 4/27 with Dr. Gala Romney, late afternoon Continue empiric antibiotics for now.  Last dose of Heparin this evening, hold further doses until after colonoscopy.  Further recommendations after colonoscopy   Orvil Feil, ANP-BC [facility]     LOS: 1 day    11/28/2014, 1:42 PM   Attending note:  Patient seen and  examined. Database reviewed. 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 He needs a colonoscopy to nail down the diagnosis. We'll plan for tomorrow afternoon. Risks benefits, limitations and alternatives have been reviewed. Patient is agreeable. Further recommendations to follow.

## 2014-11-29 NOTE — Progress Notes (Signed)
PROGRESS NOTE  Jerome Stark:[id number] DOB: 13-Jan-1946 DOA: 11/26/2014 PCP: Pcp Not In System Brief history This is a 69 year old patient who presents to the emergency room with abdominal pain, diarrhea and nausea. He was seen in the emergency room approximately one week prior to this admission and was found to have colitis. He was discharged home on ciprofloxacin and Flagyl for 2 weeks. The patient had colonoscopy 15 years ago and one year ago which were presumptively negative. Patient states that he has had diarrhea for the better part of one year, but started having some hematochezia in the past 3 weeks. The patient had taken 7 days of oral antibiotic therapy and reports that his symptoms were getting worse. He came back to the emergency room for evaluation where CT scan showed persistent/improving colitis. Since he had persistent symptoms, he was admitted to the hospital for further treatments.  Assessment/Plan: Colitis -Concern about underlying inflammatory bowel disease -Appreciate gastroenterology -Plans noted for colonoscopy 4/27 -pain has improved -Continue intravenous ciprofloxacin and Flagyl for now -C. difficile PCR negative -Fecal lactoferrin positive -11/26/2014 CT abdomen pelvis showed improved but persistent inflammatory stranding from the transverse colon to sigmoid Hypertension -Continue amlodipine-->decrease to 62m  Hypothyroidism -Continue Synthroid Severe malnutrition -Continue resource breeze    Family Communication:   Wife updated at beside 4/27 Disposition Plan:   Home when medically stable        Procedures/Studies: Ct Abdomen Pelvis W Contrast  11/26/2014   CLINICAL DATA:  69year old male with 2 week history of generalized malaise, abdominal pain and diarrhea. Recent CT imaging demonstrated diffuse colitis.  EXAM: CT ABDOMEN AND PELVIS WITH CONTRAST  TECHNIQUE: Multidetector CT imaging of the abdomen and pelvis was performed using the  standard protocol following bolus administration of intravenous contrast.  CONTRAST:  539mOMNIPAQUE IOHEXOL 300 MG/ML SOLN, 10024mMNIPAQUE IOHEXOL 300 MG/ML SOLN  COMPARISON:  Recent prior CT abdomen/ pelvis 11/20/2014  FINDINGS: Lower Chest: The lung bases are clear. Visualized cardiac structures are within normal limits for size. No pericardial effusion. Unremarkable visualized distal thoracic esophagus.  Abdomen: Unremarkable CT appearance of the stomach, duodenum (save for small D3 duodenum diverticula) spleen, adrenal glands and pancreas. Normal hepatic contour and morphology. No discrete hepatic lesion. Gallbladder is unremarkable. No intra or extrahepatic biliary ductal dilatation.  Unremarkable appearance of the bilateral kidneys. No focal solid lesion, hydronephrosis or nephrolithiasis. Similar appearance of a submucosal edema and haziness involving the wall of the distal transverse colon at splenic flexure and extending throughout the descending and sigmoid colon. Inflammatory stranding in the pericolonic fat appears to have slightly improved since the recent prior study. No evidence of interval obstruction or perforation. Normal appendix in the right lower quadrant. No free fluid or suspicious adenopathy.  Pelvis: Unremarkable bladder, prostate gland and seminal vesicles. No free fluid or suspicious adenopathy.  Bones/Soft Tissues: No acute fracture or aggressive appearing lytic or blastic osseous lesion.  Vascular: Atherosclerotic vascular disease without significant stenosis or aneurysmal dilatation.  IMPRESSION: 1. Slight interval improvement in imaging findings consistent with colitis extending from the distal transverse colon through the sigmoid colon. There is slightly less inflammatory stranding in the pericolonic fat compared to 11/20/2014. No evidence of perforation or free air. Differential considerations remain a primarily infectious versus inflammatory colitis. 2. Additional ancillary  findings as above without significant interval change.   Electronically Signed   By: HeaJacqulynn CadetD.   On: 11/26/2014 22:04  Ct Abdomen Pelvis W Contrast  11/20/2014   CLINICAL DATA:  Left lower quadrant abdominal pain.  EXAM: CT ABDOMEN AND PELVIS WITH CONTRAST  TECHNIQUE: Multidetector CT imaging of the abdomen and pelvis was performed using the standard protocol following bolus administration of intravenous contrast.  CONTRAST:  154m OMNIPAQUE IOHEXOL 300 MG/ML  SOLN  COMPARISON:  None.  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 Visualized lung bases appear normal.  No gallstones are noted. No focal abnormality is noted in the liver or pancreas. Mild splenomegaly is noted. Adrenal glands appear normal. Kidneys appear normal. No hydronephrosis or renal obstruction is noted. No renal or ureteral calculi are noted. Atherosclerotic calcifications of abdominal aorta are noted without aneurysm formation.  There is no evidence of bowel obstruction. Significant wall thickening is seen involving the distal transverse colon, descending colon, sigmoid colon and rectum motor consistent with inflammatory or infectious colitis. No abnormal fluid collection is noted. Urinary bladder appears normal. No significant adenopathy is noted.  IMPRESSION: Significant wall thickening is seen involving the distal transverse, descending and sigmoid colon, as well as the rectum, most consistent with inflammatory or infectious colitis. Ischemic colitis cannot be excluded, but is felt to be much less likely.   Electronically Signed   By: JMarijo Conception M.D.   On: 11/20/2014 14:16         Subjective: Patient denies fevers, chills, headache, chest pain, dyspnea, nausea, vomiting,  abdominal pain, dysuria, hematuria. Abdominal pain has improved   Objective: Filed Vitals:   11/28/14 0543 11/28/14 1315 11/28/14 2304 11/29/14 0553  BP: 103/65 106/62 112/68 114/64  Pulse: 64 74 71 66    Temp: 98.1 F (36.7 C) 98.2 F (36.8 C) 98.7 F (37.1 C) 99.1 F (37.3 C)  TempSrc: Oral Oral Oral Oral  Resp: 18 18 20 18   Height:      Weight:      SpO2: 100% 98% 99% 95%    Intake/Output Summary (Last 24 hours) at 11/29/14 0740 Last data filed at 11/28/14 1545  Gross per 24 hour  Intake 3491.17 ml  Output      0 ml  Net 3491.17 ml   Weight change:  Exam:   General:  Pt is alert, follows commands appropriately, not in acute distress  HEENT: No icterus, No thrush,  [place]/AT  Cardiovascular: RRR, S1/S2, no rubs, no gallops  Respiratory: CTA bilaterally, no wheezing, no crackles, no rhonchi  Abdomen: Soft/+BS, non tender, non distended, no guarding  Extremities: No edema, No lymphangitis, No petechiae, No rashes, no synovitis  Data Reviewed: Basic Metabolic Panel:  Recent Labs Lab 11/26/14 1628 11/27/14 0607 11/28/14 0610 11/29/14 0632  NA 132* 133* 140 138  K 3.8 4.1 3.9 3.6  CL 99 102 107 107  CO2 27 24 28 27   GLUCOSE 141* 143* 108* 104*  BUN 7 6 5* <5*  CREATININE 0.96 0.81 0.88 0.85  CALCIUM 7.8* 7.7* 7.9* 7.8*   Liver Function Tests:  Recent Labs Lab 11/26/14 1628 11/27/14 0607 11/28/14 0610 11/29/14 0632  AST 18 19 22 17   ALT 16 18 22 20   ALKPHOS 51 46 48 49  BILITOT 0.4 0.5 0.3 0.3  PROT 6.0 5.7* 6.1 5.1*  ALBUMIN 2.3* 2.2* 2.4* 2.0*    Recent Labs Lab 11/26/14 1628  LIPASE 29   No results for input(s): AMMONIA in the last 168 hours. CBC:  Recent Labs Lab 11/26/14 1628 11/27/14 0607 11/28/14 0610 11/29/14 0632  WBC 7.5  5.2 9.5 6.2  NEUTROABS 3.7 3.5 5.1 3.4  HGB 10.9* 10.5* 11.3* 10.1*  HCT 33.2* 32.4* 35.4* 30.8*  MCV 80.8 81.0 81.9 81.5  PLT 380 375 419* 329   Cardiac Enzymes: No results for input(s): CKTOTAL, CKMB, CKMBINDEX, TROPONINI in the last 168 hours. BNP: Invalid input(s): POCBNP CBG: No results for input(s): GLUCAP in the last 168 hours.  Recent Results (from the past 240 hour(s))  Clostridium Difficile  by PCR     Status: None   Collection Time: 11/24/14 11:49 AM  Result Value Ref Range Status   C difficile by pcr Not Detected Not Detected Final    Comment: This test is for use only with liquid or soft stools; performance characteristics of other clinical specimen types have not been established.   This assay was performed by Cepheid GeneXpert(R) PCR. The performance characteristics of this assay have been determined by [facility]. Performance characteristics refer to the analytical performance of the test.   Stool culture     Status: None   Collection Time: 11/24/14 11:49 AM  Result Value Ref Range Status   Organism ID, Bacteria No Salmonella,Shigella,Campylobacter,Yersinia,or  Final    Comment: Reduced Normal Flora present   Organism ID, Bacteria No E.coli [id number]:H7 isolated.  Final   Organism ID, Bacteria Moderate Yeast  Final  Clostridium Difficile by PCR     Status: None   Collection Time: 11/26/14 10:17 PM  Result Value Ref Range Status   C difficile by pcr NEGATIVE NEGATIVE Final  Stool culture     Status: None (Preliminary result)   Collection Time: 11/26/14 10:17 PM  Result Value Ref Range Status   Specimen Description STOOL  Final   Special Requests NONE  Final   Culture   Final    Culture reincubated for better growth Performed at [facility]    Report Status PENDING  Incomplete  Ova and parasite examination     Status: None   Collection Time: 11/27/14 12:50 AM  Result Value Ref Range Status   Specimen Description STOOL  Final   Special Requests NONE  Final   Ova and parasites   Final    NO OVA OR PARASITES SEEN ABUNDANT WBC MODERATE RBC MODERATE YEAST Performed at [facility]    Report Status 11/28/2014 FINAL  Final     Scheduled Meds: . amLODipine  10 mg Oral q morning - 10a  . cholecalciferol  2,000 Units Oral Daily  . ciprofloxacin  400 mg Intravenous Q12H  . feeding supplement (RESOURCE BREEZE)  1 Container Oral BID BM  .  levothyroxine  75 mcg Oral QAC breakfast  . metronidazole  500 mg Intravenous Q8H   Continuous Infusions: . sodium chloride 125 mL/hr at 11/29/14 0145  . sodium chloride       Sheniqua Carolan, DO  [facility] Pager [phone]  If 7PM-7AM, please contact night-coverage [email or web address] Password [email or web address] 11/29/2014, 7:40 AM   LOS: 2 days

## 2014-11-29 NOTE — OR Nursing (Signed)
Left at 1535. Meagan White,RN to waste 57m [employer].

## 2014-11-30 ENCOUNTER — Telehealth: Payer: Self-pay | Admitting: Gastroenterology

## 2014-11-30 ENCOUNTER — Encounter (HOSPITAL_COMMUNITY): Payer: Self-pay | Admitting: Internal Medicine

## 2014-11-30 ENCOUNTER — Ambulatory Visit: Payer: Medicare Other | Admitting: Gastroenterology

## 2014-11-30 LAB — COMPREHENSIVE METABOLIC PANEL
ALBUMIN: 2.4 g/dL — AB (ref 3.5–5.2)
ALT: 24 U/L (ref 0–53)
ANION GAP: 4 — AB (ref 5–15)
AST: 22 U/L (ref 0–37)
Alkaline Phosphatase: 60 U/L (ref 39–117)
BUN: <5 mg/dL — ABNORMAL LOW (ref 6–23)
CALCIUM: 8.1 mg/dL — AB (ref 8.4–10.5)
CO2: 26 mmol/L (ref 19–32)
CREATININE: 0.84 mg/dL (ref 0.50–1.35)
Chloride: 107 mmol/L (ref 96–112)
GFR, EST NON AFRICAN AMERICAN: 88 mL/min — AB (ref >90)
GLUCOSE: 167 mg/dL — AB (ref 70–99)
Potassium: 4 mmol/L (ref 3.5–5.1)
SODIUM: 137 mmol/L (ref 135–145)
TOTAL PROTEIN: 6 g/dL (ref 6.0–8.3)
Total Bilirubin: 0.3 mg/dL (ref 0.3–1.2)

## 2014-11-30 LAB — CBC WITH DIFFERENTIAL/PLATELET
Basophils Absolute: 0 10*3/uL (ref 0.0–0.1)
Basophils Relative: 0 % (ref 0–1)
Eosinophils Absolute: 0 10*3/uL (ref 0.0–0.7)
Eosinophils Relative: 0 % (ref 0–5)
HCT: 35.2 % — ABNORMAL LOW (ref 39.0–52.0)
HEMOGLOBIN: 11.3 g/dL — AB (ref 13.0–17.0)
LYMPHS PCT: 36 % (ref 12–46)
Lymphs Abs: 2.1 10*3/uL (ref 0.7–4.0)
MCH: 26.2 pg (ref 26.0–34.0)
MCHC: 32.1 g/dL (ref 30.0–36.0)
MCV: 81.7 fL (ref 78.0–100.0)
MONOS PCT: 2 % — AB (ref 3–12)
Monocytes Absolute: 0.1 10*3/uL (ref 0.1–1.0)
NEUTROS PCT: 61 % (ref 43–77)
Neutro Abs: 3.6 10*3/uL (ref 1.7–7.7)
PLATELETS: 401 10*3/uL — AB (ref 150–400)
RBC: 4.31 MIL/uL (ref 4.22–5.81)
RDW: 14.9 % (ref 11.5–15.5)
WBC: 5.8 10*3/uL (ref 4.0–10.5)

## 2014-11-30 MED ORDER — MESALAMINE 1.2 G PO TBEC: 4.8000 g | DELAYED_RELEASE_TABLET | Freq: Every day | ORAL | Status: DC

## 2014-11-30 MED ORDER — PREDNISONE 20 MG PO TABS: 40.0000 mg | ORAL_TABLET | Freq: Every day | ORAL | Status: DC

## 2014-11-30 MED ORDER — BOOST / RESOURCE BREEZE PO LIQD: 1.0000 | Freq: Two times a day (BID) | ORAL | Status: DC

## 2014-11-30 NOTE — Progress Notes (Signed)
Pt discharged home today per Dr. Carles Collet.  Pt's IV site D/C'd and WDL.  Pt's VSS.  Pt provided with discharge instructions, prescriptions and medication list.  Pt and pt's wife verbalized understanding. Pt ambulated off floor in stable condition accompanied by nursing staff.

## 2014-11-30 NOTE — Discharge Summary (Signed)
Physician Discharge Summary  Jerome Stark WVP:[id number] DOB: 06-Jun-1946 DOA: 11/26/2014  PCP: Pcp Not In System  Admit date: 11/26/2014 Discharge date: 11/30/2014  Recommendations for Outpatient Follow-up:  1. Pt will need to follow up with PCP in 2 weeks post discharge 2. Do not take amlodipine or HCTZ and 3 follow-up with your primary care physician for blood pressure check  Discharge Diagnoses:  Colitis/Diarrhea -colonoscopy consistent with inflammatory bowel disease ([facility]) -Appreciate gastroenterology -colonoscopy 4/27--left sided proctocolitis consistent with inflammatory bowel disease -Patient was started on mesalamine and prednisone 40 mg daily -case was discussed with Neil Crouch, PA-C with GI on day of d/c-->continue prednisone until pt follows up in office in 1-2 weeks -pain has improved -Discontinue ciprofloxacin and Flagyl--the patient has completed over 10 days of therapy which included 3+ days of abx during the hospitalization -C. difficile PCR negative -Fecal lactoferrin positive -11/26/2014 CT abdomen pelvis showed improved but persistent inflammatory stranding from the transverse colon to sigmoid -Patient stool began to have some substance;  -Patient's diet was advanced which he tolerated Hypertension -Continue amlodipine-->decrease to 49m  -pt's BP remained soft-->d/c amlodipine and HCTZ -follow up with PCP for BP check before restarting meds Hypothyroidism -Continue Synthroid Severe malnutrition -Continue resource breeze  Discharge Condition: stable  Disposition: home Follow-up Information    Follow up with RManus Rudd MD In 2 weeks.   Specialty:  Gastroenterology   Contact information:   [phone]       Diet:low fat Wt Readings from Last 3 Encounters:  11/26/14 73.256 kg (161 lb 8 oz)  11/20/14 74.844 kg (165 lb)    History of present illness:   This is a 69year old patient who presents to the  emergency room with abdominal pain, diarrhea and nausea. He was seen in the emergency room approximately one week prior to this admission and was found to have colitis. He was discharged home on ciprofloxacin and Flagyl for 2 weeks. The patient had colonoscopy 15 years ago and one year ago which were presumptively negative. Patient states that he has had diarrhea for the better part of one year, but started having some hematochezia in the past 3 weeks. The patient had taken 7 days of oral antibiotic therapy and reports that his symptoms were getting worse. He came back to the emergency room for evaluation where CT scan showed persistent/improving colitis. Since he had persistent symptoms, he was admitted to the hospital for further treatments.  Consultants: GI--Rourk  Discharge Exam: Filed Vitals:   11/30/14 0639  BP: 107/67  Pulse: 72  Temp: 98.4 F (36.9 C)  Resp: 20   Filed Vitals:   11/29/14 1535 11/29/14 1540 11/29/14 2153 11/30/14 0639  BP: 93/53 91/51 112/63 107/67  Pulse: 76 78 77 72  Temp:   98.6 F (37 C) 98.4 F (36.9 C)  TempSrc:   Oral Oral  Resp: 16 17 20 20   Height:      Weight:      SpO2: 99% 98% 96% 99%   General: A&O x 3, NAD, pleasant, cooperative Cardiovascular: RRR, no rub, no gallop, no S3 Respiratory: CTAB, no wheeze, no rhonchi Abdomen:soft, nontender, nondistended, positive bowel sounds Extremities: No edema, No lymphangitis, no petechiae  Discharge Instructions      Discharge Instructions    Diet - low sodium heart healthy    Complete by:  As directed      Discharge instructions    Complete by:  As directed   Do  not restart amlodipine or HCTZ until you see your primary care doctor     Increase activity slowly    Complete by:  As directed             Medication List    STOP taking these medications        amLODipine 10 MG tablet  Commonly known as:  NORVASC     ciprofloxacin 500 MG tablet  Commonly known as:  CIPRO      hydrochlorothiazide 25 MG tablet  Commonly known as:  HYDRODIURIL     HYDROcodone-acetaminophen 5-325 MG per tablet  Commonly known as:  NORCO/VICODIN     metroNIDAZOLE 500 MG tablet  Commonly known as:  FLAGYL     naproxen sodium 220 MG tablet  Commonly known as:  ANAPROX      TAKE these medications        cholecalciferol 1000 UNITS tablet  Commonly known as:  VITAMIN D  Take 2,000 Units by mouth daily.     feeding supplement (RESOURCE BREEZE) Liqd  Take 1 Container by mouth 2 (two) times daily between meals.     FISH OIL PO  Take 2 capsules by mouth daily.     levothyroxine 75 MCG tablet  Commonly known as:  SYNTHROID, LEVOTHROID  Take 75 mcg by mouth daily before breakfast.     mesalamine 1.2 G EC tablet  Commonly known as:  LIALDA  Take 4 tablets (4.8 g total) by mouth daily with breakfast.     ondansetron 4 MG disintegrating tablet  Commonly known as:  ZOFRAN ODT  Take 1 tablet (4 mg total) by mouth every 8 (eight) hours as needed for nausea.     predniSONE 20 MG tablet  Commonly known as:  DELTASONE  Take 2 tablets (40 mg total) by mouth daily with breakfast.     QC LORATADINE ALLERGY RELIEF 10 MG tablet  Generic drug:  loratadine  Take 10 mg by mouth daily as needed for allergies.         The results of significant diagnostics from this hospitalization (including imaging, microbiology, ancillary and laboratory) are listed below for reference.    Significant Diagnostic Studies: Ct Abdomen Pelvis W Contrast  11/26/2014   CLINICAL DATA:  69 year old male with 2 week history of generalized malaise, abdominal pain and diarrhea. Recent CT imaging demonstrated diffuse colitis.  EXAM: CT ABDOMEN AND PELVIS WITH CONTRAST  TECHNIQUE: Multidetector CT imaging of the abdomen and pelvis was performed using the standard protocol following bolus administration of intravenous contrast.  CONTRAST:  54m OMNIPAQUE IOHEXOL 300 MG/ML SOLN, 1085mOMNIPAQUE IOHEXOL 300 MG/ML  SOLN  COMPARISON:  Recent prior CT abdomen/ pelvis 11/20/2014  FINDINGS: Lower Chest: The lung bases are clear. Visualized cardiac structures are within normal limits for size. No pericardial effusion. Unremarkable visualized distal thoracic esophagus.  Abdomen: Unremarkable CT appearance of the stomach, duodenum (save for small D3 duodenum diverticula) spleen, adrenal glands and pancreas. Normal hepatic contour and morphology. No discrete hepatic lesion. Gallbladder is unremarkable. No intra or extrahepatic biliary ductal dilatation.  Unremarkable appearance of the bilateral kidneys. No focal solid lesion, hydronephrosis or nephrolithiasis. Similar appearance of a submucosal edema and haziness involving the wall of the distal transverse colon at splenic flexure and extending throughout the descending and sigmoid colon. Inflammatory stranding in the pericolonic fat appears to have slightly improved since the recent prior study. No evidence of interval obstruction or perforation. Normal appendix in the right lower quadrant. No free  fluid or suspicious adenopathy.  Pelvis: Unremarkable bladder, prostate gland and seminal vesicles. No free fluid or suspicious adenopathy.  Bones/Soft Tissues: No acute fracture or aggressive appearing lytic or blastic osseous lesion.  Vascular: Atherosclerotic vascular disease without significant stenosis or aneurysmal dilatation.  IMPRESSION: 1. Slight interval improvement in imaging findings consistent with colitis extending from the distal transverse colon through the sigmoid colon. There is slightly less inflammatory stranding in the pericolonic fat compared to 11/20/2014. No evidence of perforation or free air. Differential considerations remain a primarily infectious versus inflammatory colitis. 2. Additional ancillary findings as above without significant interval change.   Electronically Signed   By: Jacqulynn Cadet M.D.   On: 11/26/2014 22:04   Ct Abdomen Pelvis W  Contrast  11/20/2014   CLINICAL DATA:  Left lower quadrant abdominal pain.  EXAM: CT ABDOMEN AND PELVIS WITH CONTRAST  TECHNIQUE: Multidetector CT imaging of the abdomen and pelvis was performed using the standard protocol following bolus administration of intravenous contrast.  CONTRAST:  175m OMNIPAQUE IOHEXOL 300 MG/ML  SOLN  COMPARISON:  None.  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 Visualized lung bases appear normal.  No gallstones are noted. No focal abnormality is noted in the liver or pancreas. Mild splenomegaly is noted. Adrenal glands appear normal. Kidneys appear normal. No hydronephrosis or renal obstruction is noted. No renal or ureteral calculi are noted. Atherosclerotic calcifications of abdominal aorta are noted without aneurysm formation.  There is no evidence of bowel obstruction. Significant wall thickening is seen involving the distal transverse colon, descending colon, sigmoid colon and rectum motor consistent with inflammatory or infectious colitis. No abnormal fluid collection is noted. Urinary bladder appears normal. No significant adenopathy is noted.  IMPRESSION: Significant wall thickening is seen involving the distal transverse, descending and sigmoid colon, as well as the rectum, most consistent with inflammatory or infectious colitis. Ischemic colitis cannot be excluded, but is felt to be much less likely.   Electronically Signed   By: JMarijo Conception M.D.   On: 11/20/2014 14:16     Microbiology: Recent Results (from the past 240 hour(s))  Clostridium Difficile by PCR     Status: None   Collection Time: 11/24/14 11:49 AM  Result Value Ref Range Status   C difficile by pcr Not Detected Not Detected Final    Comment: This test is for use only with liquid or soft stools; performance characteristics of other clinical specimen types have not been established.   This assay was performed by Cepheid GeneXpert(R) PCR. The performance  characteristics of this assay have been determined by [facility] Performance characteristics refer to the analytical performance of the test.   Stool culture     Status: None   Collection Time: 11/24/14 11:49 AM  Result Value Ref Range Status   Organism ID, Bacteria No Salmonella,Shigella,Campylobacter,Yersinia,or  Final    Comment: Reduced Normal Flora present   Organism ID, Bacteria No E.coli [id number]:H7 isolated.  Final   Organism ID, Bacteria Moderate Yeast  Final  Clostridium Difficile by PCR     Status: None   Collection Time: 11/26/14 10:17 PM  Result Value Ref Range Status   C difficile by pcr NEGATIVE NEGATIVE Final  Stool culture     Status: None (Preliminary result)   Collection Time: 11/26/14 10:17 PM  Result Value Ref Range Status   Specimen Description STOOL  Final   Special Requests NONE  Final   Culture   Final  NO SUSPICIOUS COLONIES, CONTINUING TO HOLD Note: REDUCED NORMAL FLORA PRESENT Performed at [facility]    Report Status PENDING  Incomplete  Ova and parasite examination     Status: None   Collection Time: 11/27/14 12:50 AM  Result Value Ref Range Status   Specimen Description STOOL  Final   Special Requests NONE  Final   Ova and parasites   Final    NO OVA OR PARASITES SEEN ABUNDANT WBC MODERATE RBC MODERATE YEAST Performed at [facility]    Report Status 11/28/2014 FINAL  Final     Labs: Basic Metabolic Panel:  Recent Labs Lab 11/26/14 1628 11/27/14 0607 11/28/14 0610 11/29/14 0632 11/30/14 0545  NA 132* 133* 140 138 137  K 3.8 4.1 3.9 3.6 4.0  CL 99 102 107 107 107  CO2 27 24 28 27 26   GLUCOSE 141* 143* 108* 104* 167*  BUN 7 6 5* <5* <5*  CREATININE 0.96 0.81 0.88 0.85 0.84  CALCIUM 7.8* 7.7* 7.9* 7.8* 8.1*   Liver Function Tests:  Recent Labs Lab 11/26/14 1628 11/27/14 0607 11/28/14 0610 11/29/14 0632 11/30/14 0545  AST 18 19 22 17 22   ALT 16 18 22 20 24   ALKPHOS 51 46 48 49 60  BILITOT 0.4  0.5 0.3 0.3 0.3  PROT 6.0 5.7* 6.1 5.1* 6.0  ALBUMIN 2.3* 2.2* 2.4* 2.0* 2.4*    Recent Labs Lab 11/26/14 1628  LIPASE 29   No results for input(s): AMMONIA in the last 168 hours. CBC:  Recent Labs Lab 11/26/14 1628 11/27/14 0607 11/28/14 0610 11/29/14 0632 11/30/14 0545  WBC 7.5 5.2 9.5 6.2 5.8  NEUTROABS 3.7 3.5 5.1 3.4 3.6  HGB 10.9* 10.5* 11.3* 10.1* 11.3*  HCT 33.2* 32.4* 35.4* 30.8* 35.2*  MCV 80.8 81.0 81.9 81.5 81.7  PLT 380 375 419* 329 401*   Cardiac Enzymes: No results for input(s): CKTOTAL, CKMB, CKMBINDEX, TROPONINI in the last 168 hours. BNP: Invalid input(s): POCBNP CBG: No results for input(s): GLUCAP in the last 168 hours.  Time coordinating discharge:  Greater than 30 minutes  Signed:  [place] Mckeough, DO [facility] Pager: [phone] 11/30/2014, 9:37 AM

## 2014-11-30 NOTE — Telephone Encounter (Signed)
PATIENT SCHEDULED 12/11/14

## 2014-11-30 NOTE — Progress Notes (Signed)
Discussed case with Dr. Carles Collet.  Discussed briefly with patient.  Continue Lialda 4.8g daily. Continue prednisone 24m daily until seen back in our office in approximately two weeks. Patient will contact uKoreaif any questions or concerns. We will be in touch regarding pathology results as they become available.   LLaureen Ochs LBernarda CaffeyRUniversity [facility]ie[facility] [phone] [id number]/201611:[id number] AM

## 2014-11-30 NOTE — Care Management Utilization Note (Signed)
UR completed 

## 2014-11-30 NOTE — Telephone Encounter (Signed)
Patient needs hospital follow up in 10 days or so. Has to be seen within two week from today. Use urgent if needed.

## 2014-12-01 LAB — STOOL CULTURE

## 2014-12-04 ENCOUNTER — Encounter: Payer: Self-pay | Admitting: Internal Medicine

## 2014-12-10 ENCOUNTER — Telehealth (INDEPENDENT_AMBULATORY_CARE_PROVIDER_SITE_OTHER): Payer: Self-pay | Admitting: Internal Medicine

## 2014-12-10 NOTE — Telephone Encounter (Signed)
noted 

## 2014-12-10 NOTE — Telephone Encounter (Signed)
Patient called yesterday stating that he was seeing more blood with this bowel movements. He said he was taking 20 mg of prednisone per day along with Lialda. Patient denied nausea vomiting or fever. Patient was advised to increase prednisone to 40 mg a day. He has appointment with Dr. Gala Romney next week

## 2014-12-11 ENCOUNTER — Encounter: Payer: Self-pay | Admitting: Gastroenterology

## 2014-12-11 ENCOUNTER — Ambulatory Visit (INDEPENDENT_AMBULATORY_CARE_PROVIDER_SITE_OTHER): Payer: Medicare Other | Admitting: Gastroenterology

## 2014-12-11 VITALS — BP 120/70 | HR 81 | Temp 97.0°F | Ht 71.0 in | Wt 167.4 lb

## 2014-12-11 DIAGNOSIS — K51919 Ulcerative colitis, unspecified with unspecified complications: Secondary | ICD-10-CM | POA: Diagnosis not present

## 2014-12-11 MED ORDER — MESALAMINE 1.2 G PO TBEC: 4.8000 g | DELAYED_RELEASE_TABLET | Freq: Every day | ORAL | Status: DC

## 2014-12-11 MED ORDER — MESALAMINE-CLEANSER 4 G RE KIT: 1.0000 | PACK | Freq: Two times a day (BID) | RECTAL | Status: DC

## 2014-12-11 MED ORDER — PREDNISONE 10 MG PO TABS: ORAL_TABLET | ORAL | Status: DC

## 2014-12-11 NOTE — Patient Instructions (Signed)
For Prednisone:  Take 4 tablets daily for 2 weeks. Then take 3 tablets for 1 week.Then take 2 tablest for 1 week. Then take 1 tablet for 1 week. Then 1/2 tablet for 1 week then stop. I have provided the print out for this.   I have sent enemas to your pharmacy to take twice a day per rectum for 2 weeks then once each night for 2 weeks.   I have given you the prescription for the Lialda to take to the [place] in a 90 day supply.  I have also given a letter for blood work that needs to be done periodically.

## 2014-12-12 ENCOUNTER — Ambulatory Visit: Payer: Medicare Other | Admitting: Nurse Practitioner

## 2014-12-19 ENCOUNTER — Encounter: Payer: Self-pay | Admitting: Gastroenterology

## 2014-12-19 NOTE — Assessment & Plan Note (Signed)
69 year old male with recent colonoscopy showing left-sided proctocolitis, doing overall fairly well since recent hospitalization. I feel he would benefit from topical therapy as well to help induce remission. Will start the slow tapering of Prednisone, continue Lialda, and add Rowasa enemas for twice a day for 2 weeks then nightly. Return in 2 months.

## 2014-12-19 NOTE — Progress Notes (Signed)
Referring Provider: No ref. provider found Primary Care Physician:  Pcp Not In System Primary GI: Dr. Gala Romney    Chief Complaint  Patient presents with  . Follow-up    HPI:   Jerome Stark is a 69 y.o. male presenting today with a history of idiopathic proctitis in 2003, lost to follow-up and was recently hospitalized in April 2016 due to colitis, with updated colonoscopy noting left-sided proctocolitis. He was started on Lialda and a Prednisone taper. Noted some blood on Satruday and a red streak recently. Stool is solid/soft. Remains on prednisone 40 mg, Lialda 4.8 grams. States he is taking naps, gets exhausted quickly. Has good appetite. Has gained a few lbs since hospital discharge.   Past Medical History  Diagnosis Date  . Hypertension   . Thyroid disease     Past Surgical History  Procedure Laterality Date  . Lung surgery    . Back surgery    . 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 Colonoscopy  March 2015    Dr. Britta Mccreedy: diminutive hyperplastic polyp. scope advanced to the cecum, no intubation of TI, no random biopsies performed  . Colonoscopy N/A 11/29/2014    Dr. Gala Romney: left-sided proctocolitis     Current Outpatient Prescriptions  Medication Sig Dispense Refill  . cholecalciferol (VITAMIN D) 1000 UNITS tablet Take 2,000 Units by mouth daily.    . feeding supplement, RESOURCE BREEZE, (RESOURCE BREEZE) LIQD Take 1 Container by mouth 2 (two) times daily between meals. 60 Container 0  . levothyroxine (SYNTHROID, LEVOTHROID) 75 MCG tablet Take 75 mcg by mouth daily before breakfast.    . mesalamine (LIALDA) 1.2 G EC tablet Take 4 tablets (4.8 g total) by mouth daily with breakfast. 120 tablet 0  . Omega-3 Fatty Acids (FISH OIL PO) Take 2 capsules by mouth daily.    . QC LORATADINE ALLERGY RELIEF 10 MG tablet Take 10 mg by mouth daily as needed for allergies.   3  . mesalamine (LIALDA) 1.2 G EC tablet Take 4 tablets (4.8 g total) by mouth daily with  breakfast. 360 tablet 3  . Mesalamine-Cleanser 4 G KIT Place 1 kit (4 g total) rectally 2 (two) times daily. For 2 weeks then once daily for 2 weeks. 28 each 1  . ondansetron (ZOFRAN ODT) 4 MG disintegrating tablet Take 1 tablet (4 mg total) by mouth every 8 (eight) hours as needed for nausea. (Patient not taking: Reported on 12/11/2014) 6 tablet 0  . predniSONE (DELTASONE) 10 MG tablet Take 4 tablets daily for 2 weeks. Then take 3 tablets for 1 week. Then take 2 tablet for 1 week. Then take 1  tablet for 1 week. Then 1/2 tablet for 8 days then stop. 102 tablet 0   No current facility-administered medications for this visit.    Allergies as of 12/11/2014  . (No Known Allergies)    Family History  Problem Relation Age of Onset  . Hypertension Mother   . Hypertension Other   . Colon cancer Neg Hx     History   Social History  . Marital Status: Married    Spouse Name: N/A  . Number of Children: N/A  . Years of Education: N/A   Social History Main Topics  . Smoking status: Former Research scientist (life sciences)  . Smokeless tobacco: Never Used  . Alcohol Use: 0.0 oz/week    0 Standard drinks or equivalent per week     Comment: occasional wine  . Drug  Use: Not on file  . Sexual Activity: Not on file   Other Topics Concern  . None   Social History Narrative    Review of Systems: As mentioned in HPI  Physical Exam: BP 120/70 mmHg  Pulse 81  Temp(Src) 97 F (36.1 C)  Ht _0  (1.803 m)  Wt 167 lb 6.4 oz (75.932 kg)  BMI 23.36 kg/m2 General:   Alert and oriented. No distress noted. Pleasant and cooperative.  Head:  Normocephalic and atraumatic. Eyes:  Conjuctiva clear without scleral icterus. Mouth:  Oral mucosa pink and moist. Good dentition. No lesions. Heart:  S1, S2 present without murmurs, rubs, or gallops. Regular rate and rhythm. Abdomen:  +BS, soft, non-tender and non-distended. No rebound or guarding. No HSM or masses noted. Msk:  Symmetrical without gross deformities. Normal  posture. Extremities:  Without edema. Neurologic:  Alert and  oriented x4;  grossly normal neurologically. Skin:  Intact without significant lesions or rashes. Psych:  Alert and cooperative. Normal mood and affect.  Lab Results  Component Value Date   WBC 5.8 11/30/2014   HGB 11.3* 11/30/2014   HCT 35.2* 11/30/2014   MCV 81.7 11/30/2014   PLT 401* 11/30/2014   Lab Results  Component Value Date   ALT 24 11/30/2014   AST 22 11/30/2014   ALKPHOS 60 11/30/2014   BILITOT 0.3 11/30/2014   Lab Results  Component Value Date   CREATININE 0.84 11/30/2014   BUN <5* 11/30/2014   NA 137 11/30/2014   K 4.0 11/30/2014   CL 107 11/30/2014   CO2 26 11/30/2014

## 2015-01-10 NOTE — Progress Notes (Signed)
No pcp per patient 

## 2015-01-17 ENCOUNTER — Encounter: Payer: Self-pay | Admitting: Internal Medicine

## 2015-02-15 ENCOUNTER — Encounter: Payer: Self-pay | Admitting: Nurse Practitioner

## 2015-02-15 ENCOUNTER — Ambulatory Visit (INDEPENDENT_AMBULATORY_CARE_PROVIDER_SITE_OTHER): Payer: Medicare Other | Admitting: Nurse Practitioner

## 2015-02-15 VITALS — BP 113/68 | HR 69 | Temp 98.3°F | Ht 71.0 in | Wt 178.2 lb

## 2015-02-15 DIAGNOSIS — K51919 Ulcerative colitis, unspecified with unspecified complications: Secondary | ICD-10-CM | POA: Diagnosis not present

## 2015-02-15 MED ORDER — MESALAMINE 1.2 G PO TBEC: 4.8000 g | DELAYED_RELEASE_TABLET | Freq: Every day | ORAL | Status: DC

## 2015-02-15 NOTE — Assessment & Plan Note (Signed)
69 year old male with history of proctocolitis with a recent admission in May of this year for a flare up. He had been previously lost to follow-up. He was started on Lialda 4.8 g per day and discharge in the hospital along with a prednisone dose. When he was last seen end of May he was continuing to have symptoms and was started on topical therapy as well to help induce remission. The patient has within the past few weeks return to normal. Last labs were drawn in May of this year. We will keep him on his current Lialda for now. Have him return in 3 months to follow-up an update labs including CBC and CMP.

## 2015-02-15 NOTE — Patient Instructions (Signed)
1. Continue taking Lialda. 2. Return for follow-up in 3 months.

## 2015-02-15 NOTE — Progress Notes (Signed)
Referring Provider: No ref. provider found Primary Care Physician:  Pcp Not In System Primary GI:  Dr. Gala Romney  Chief Complaint  Patient presents with  . Follow-up    HPI:   69 year old male presents for follow-up on proctocolitis. Was recently pleasant 2016 and seen as a follow-up visit in May 2016 at which point his prednisone was tapered, continue Liotta, and Rowasa enemas twice a day for 2 weeks then nightly were added.  Today he states he is back to normal. Still taking Lialda. Denies abdominal pain and rectal bleeding. Energy level back to normal. Has gained back the weight he had lost and wanting to gain back. Appetite is good. Denies chest pain, dyspnea, dizziness, lightheadedness, syncope, near syncope. Denies any other upper or lower GI symptoms.  Past Medical History  Diagnosis Date  . Hypertension   . Thyroid disease     Past Surgical History  Procedure Laterality Date  . Lung surgery    . Back surgery    . 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 Colonoscopy  March 2015    Dr. Britta Mccreedy: diminutive hyperplastic polyp. scope advanced to the cecum, no intubation of TI, no random biopsies performed  . Colonoscopy N/A 11/29/2014    Dr. Gala Romney: left-sided proctocolitis     Current Outpatient Prescriptions  Medication Sig Dispense Refill  . amLODipine (NORVASC) 10 MG tablet Take 10 mg by mouth every morning.  3  . cholecalciferol (VITAMIN D) 1000 UNITS tablet Take 2,000 Units by mouth daily.    . hydrochlorothiazide (HYDRODIURIL) 25 MG tablet Take 25 mg by mouth daily.  3  . levothyroxine (SYNTHROID, LEVOTHROID) 75 MCG tablet Take 75 mcg by mouth daily before breakfast.    . mesalamine (LIALDA) 1.2 G EC tablet Take 4 tablets (4.8 g total) by mouth daily with breakfast. 360 tablet 3  . Omega-3 Fatty Acids (FISH OIL PO) Take 2 capsules by mouth daily.    . QC LORATADINE ALLERGY RELIEF 10 MG tablet Take 10 mg by mouth daily as needed for allergies.   3   No  current facility-administered medications for this visit.    Allergies as of 02/15/2015  . (No Known Allergies)    Family History  Problem Relation Age of Onset  . Hypertension Mother   . Hypertension Other   . Colon cancer Neg Hx     History   Social History  . Marital Status: Married    Spouse Name: N/A  . Number of Children: N/A  . Years of Education: N/A   Social History Main Topics  . Smoking status: Former Research scientist (life sciences)  . Smokeless tobacco: Never Used  . Alcohol Use: 0.0 oz/week    0 Standard drinks or equivalent per week     Comment: occasional wine  . Drug Use: Not on file  . Sexual Activity: Not on file   Other Topics Concern  . None   Social History Narrative    Review of Systems: General: Negative for anorexia, weight loss, fever, chills, fatigue, weakness. ENT: Negative for hoarseness, difficulty swallowing , nasal congestion. CV: Negative for chest pain, angina, palpitations, dyspnea on exertion, peripheral edema.  Respiratory: Negative for dyspnea at rest, dyspnea on exertion, cough, sputum, wheezing.  GI: See history of present illness. Endo: Negative for unusual weight change.  Heme: Negative for bruising or bleeding.   Physical Exam: BP 113/68 mmHg  Pulse 69  Temp(Src) 98.3 F (36.8 C) (Oral)  Ht 5'  11" (1.803 m)  Wt 178 lb 3.2 oz (80.831 kg)  BMI 24.86 kg/m2 General:   Alert and oriented. Pleasant and cooperative. Well-nourished and well-developed.  Head:  Normocephalic and atraumatic. Eyes:  Without icterus, sclera clear and conjunctiva pink.  Ears:  Normal auditory acuity. Cardiovascular:  S1, S2 present without murmurs appreciated. Normal pulses noted. Extremities without clubbing or edema. Respiratory:  Clear to auscultation bilaterally. No wheezes, rales, or rhonchi. No distress.  Gastrointestinal:  +BS, soft, non-tender and non-distended. No HSM noted. No guarding or rebound. No masses appreciated.  Rectal:  Deferred  Neurologic:  Alert  and oriented x4;  grossly normal neurologically. Psych:  Alert and cooperative. Normal mood and affect. Heme/Lymph/Immune: No excessive bruising noted.    02/15/2015 8:35 AM

## 2015-02-16 NOTE — Progress Notes (Signed)
No pcp per patient 

## 2015-05-17 ENCOUNTER — Encounter: Payer: Self-pay | Admitting: Nurse Practitioner

## 2015-05-17 ENCOUNTER — Ambulatory Visit (INDEPENDENT_AMBULATORY_CARE_PROVIDER_SITE_OTHER): Payer: Medicare Other | Admitting: Nurse Practitioner

## 2015-05-17 VITALS — BP 128/82 | HR 71 | Temp 98.1°F | Ht 71.0 in | Wt 188.4 lb

## 2015-05-17 DIAGNOSIS — K51819 Other ulcerative colitis with unspecified complications: Secondary | ICD-10-CM | POA: Diagnosis not present

## 2015-05-17 NOTE — Patient Instructions (Signed)
1. We will request her most recent labs that were drawn at the [place]. 2. Return for follow-up in 6 months. 3. Continue taking Lialda daily. 4. Call us if you have any recurrent symptoms or problems.

## 2015-05-17 NOTE — Progress Notes (Signed)
Referring Provider: No ref. provider found Primary Care Physician:  Pcp Not In System Primary GI:  Dr. Gala Romney  Chief Complaint  Patient presents with  . Follow-up    HPI:   69 year old male presents for follow-up on ulcerative colitis. Was hospitalized on April of this year at which point a colonoscopy was done and found moderate to severe ulcerative colitis from splenic flexure to the rectum. Successfully treated with Corlis Leak the and prednisone taper. He required Rowasa enemas and may. He was last seen 02/15/2015 which point he was doing well and stated he was back to normal. He was continued on Lialda and arranged for two-month follow-up for labs and reevaluation.  Today he states he's doing quite well. Denies abdominal pain, N/V, diarrhea, hematochezia, melena. Energy still good, appetite good. Is not losing any weight unintentionally. Rarely will have some upset stomach with a particular food item with need to have a bowel movement a few times and mild rectal pain which self-resolved. This is rare and he states tolerable. Denies fever, chills, chest pain, dyspnea, dizziness, lightheadedness, syncope, near syncope. Denies any other upper or lower GI symptoms.  Past Medical History  Diagnosis Date  . Hypertension   . Thyroid disease   . Proctocolitis ([place])     On Lialda    Past Surgical History  Procedure Laterality Date  . Lung surgery    . Back surgery    . 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 Colonoscopy  March 2015    Dr. Britta Mccreedy: diminutive hyperplastic polyp. scope advanced to the cecum, no intubation of TI, no random biopsies performed  . Colonoscopy N/A 11/29/2014    Dr. Gala Romney: left-sided proctocolitis     Current Outpatient Prescriptions  Medication Sig Dispense Refill  . amLODipine (NORVASC) 10 MG tablet Take 10 mg by mouth every morning.  3  . cholecalciferol (VITAMIN D) 1000 UNITS tablet Take 2,000 Units by mouth daily.    . hydrochlorothiazide  (HYDRODIURIL) 25 MG tablet Take 25 mg by mouth daily.  3  . levothyroxine (SYNTHROID, LEVOTHROID) 75 MCG tablet Take 75 mcg by mouth daily before breakfast.    . mesalamine (LIALDA) 1.2 G EC tablet Take 4 tablets (4.8 g total) by mouth daily with breakfast. 360 tablet 1  . Omega-3 Fatty Acids (FISH OIL PO) Take 2 capsules by mouth daily.    . QC LORATADINE ALLERGY RELIEF 10 MG tablet Take 10 mg by mouth daily as needed for allergies.   3   No current facility-administered medications for this visit.    Allergies as of 05/17/2015  . (No Known Allergies)    Family History  Problem Relation Age of Onset  . Hypertension Mother   . Hypertension Other   . Colon cancer Neg Hx     Social History   Social History  . Marital Status: Married    Spouse Name: N/A  . Number of Children: N/A  . Years of Education: N/A   Social History Main Topics  . Smoking status: Former Smoker    Quit date: 02/14/1985  . Smokeless tobacco: Never Used  . Alcohol Use: 0.0 oz/week    0 Standard drinks or equivalent per week     Comment: occasional wine  . Drug Use: No  . Sexual Activity: Not Asked   Other Topics Concern  . None   Social History Narrative    Review of Systems: General: Negative for anorexia, weight loss, fever, chills,  fatigue, weakness. ENT: Negative for hoarseness, difficulty swallowing. CV: Negative for chest pain, angina, palpitations, dyspnea on exertion, peripheral edema.  Respiratory: Negative for dyspnea at rest, dyspnea on exertion, cough, sputum, wheezing.  GI: See history of present illness. Derm: Negative for rash or itching.  Endo: Negative for unusual weight change.  Heme: Negative for bruising or bleeding.   Physical Exam: BP 128/82 mmHg  Pulse 71  Temp(Src) 98.1 F (36.7 C)  Ht 5' 11"  (1.803 m)  Wt 188 lb 6.4 oz (85.458 kg)  BMI 26.29 kg/m2 General:   Alert and oriented. Pleasant and cooperative. Well-nourished and well-developed.  Head:  Normocephalic  and atraumatic. Eyes:  Without icterus, sclera clear and conjunctiva pink.  Cardiovascular:  S1, S2 present without murmurs appreciated. Normal pulses noted. Extremities without clubbing or edema. Respiratory:  Clear to auscultation bilaterally. No wheezes, rales, or rhonchi. No distress.  Gastrointestinal:  +BS, soft, non-tender and non-distended. No HSM noted. No guarding or rebound. No masses appreciated.  Rectal:  Deferred  Psych:  Alert and cooperative. Normal mood and affect.    05/17/2015 8:15 AM

## 2015-05-17 NOTE — Assessment & Plan Note (Signed)
Patient with ulcerative colitis of the splenic flexure, descending colon, sigmoid colon, and rectum. He is currently well-controlled on Lialda 4.8 g per day. Asymptomatic at this point. He gets routine labs drawn the [place] with last labs drawn 2 months ago. Per his request, we will request lab results from the [place] and a lot of and continue to monitor as long as a include CBC and CMP. No lab orders done today for this reason. Return for follow-up in 6 months as he is doing quite well.

## 2015-05-17 NOTE — Progress Notes (Signed)
cc'ed to [facility]

## 2015-06-14 ENCOUNTER — Other Ambulatory Visit: Payer: Self-pay | Admitting: Nurse Practitioner

## 2015-06-14 ENCOUNTER — Telehealth: Payer: Self-pay | Admitting: Internal Medicine

## 2015-06-14 ENCOUNTER — Other Ambulatory Visit: Payer: Self-pay

## 2015-06-14 DIAGNOSIS — K51919 Ulcerative colitis, unspecified with unspecified complications: Secondary | ICD-10-CM

## 2015-06-14 DIAGNOSIS — R197 Diarrhea, unspecified: Secondary | ICD-10-CM

## 2015-06-14 NOTE — Telephone Encounter (Signed)
Please have stool studies (C. Diff, Stool C&S, and O&P done). Also will need CBC, CMP, CRP, and ESR done. Please schedule him for a follow-up appointment. I will enter lab orders.

## 2015-06-14 NOTE — Telephone Encounter (Signed)
Spoke with the pt- he has been have loose stools x1 week. For the past 2-3 days he has had to have a bm immediately after eating. Sometimes stools are loose, sometimes they are watery. No recent antibiotics, no fever, no N/V, no pain medications (tylenol/ibuprofen) for 2-3 weeks. He was taking something from the health food store (cant remember the name of it) but he hasnt had it for about 2 weeks.  He did noticed some blood in his stool this morning. He said it was bright red and it was on the toilet paper, not in the stool. He is feeling bloated.  He is taking his lialda as prescribed.  He is concerned and wanted to know if he needed to do anything.

## 2015-06-14 NOTE — Telephone Encounter (Signed)
Pt was told to call back if he wasn't getting any better. He asked to speak with EG, but I told him that I would transfer him to [facility]

## 2015-06-14 NOTE — Telephone Encounter (Signed)
Pt is aware. Lab orders sent to the lab. He said he would go tomorrow and have blood work done and pick up stool containers from the lab.

## 2015-06-14 NOTE — Telephone Encounter (Signed)
APPT MADE AND PATIENT AWARE

## 2015-06-15 LAB — COMPREHENSIVE METABOLIC PANEL
ALK PHOS: 97 U/L (ref 40–115)
ALT: 19 U/L (ref 9–46)
AST: 16 U/L (ref 10–35)
Albumin: 3.8 g/dL (ref 3.6–5.1)
BUN: 11 mg/dL (ref 7–25)
CO2: 26 mmol/L (ref 20–31)
CREATININE: 0.97 mg/dL (ref 0.70–1.25)
Calcium: 8.8 mg/dL (ref 8.6–10.3)
Chloride: 102 mmol/L (ref 98–110)
Glucose, Bld: 104 mg/dL — ABNORMAL HIGH (ref 65–99)
POTASSIUM: 4.1 mmol/L (ref 3.5–5.3)
SODIUM: 139 mmol/L (ref 135–146)
Total Bilirubin: 0.6 mg/dL (ref 0.2–1.2)
Total Protein: 6.8 g/dL (ref 6.1–8.1)

## 2015-06-15 LAB — CBC WITH DIFFERENTIAL/PLATELET
BASOS PCT: 0 % (ref 0–1)
Basophils Absolute: 0 10*3/uL (ref 0.0–0.1)
EOS ABS: 0.4 10*3/uL (ref 0.0–0.7)
EOS PCT: 6 % — AB (ref 0–5)
HCT: 38.7 % — ABNORMAL LOW (ref 39.0–52.0)
Hemoglobin: 13.1 g/dL (ref 13.0–17.0)
LYMPHS ABS: 2.6 10*3/uL (ref 0.7–4.0)
Lymphocytes Relative: 36 % (ref 12–46)
MCH: 28.5 pg (ref 26.0–34.0)
MCHC: 33.9 g/dL (ref 30.0–36.0)
MCV: 84.3 fL (ref 78.0–100.0)
MONO ABS: 0.8 10*3/uL (ref 0.1–1.0)
MONOS PCT: 11 % (ref 3–12)
MPV: 9.7 fL (ref 8.6–12.4)
Neutro Abs: 3.4 10*3/uL (ref 1.7–7.7)
Neutrophils Relative %: 47 % (ref 43–77)
PLATELETS: 261 10*3/uL (ref 150–400)
RBC: 4.59 MIL/uL (ref 4.22–5.81)
RDW: 15.1 % (ref 11.5–15.5)
WBC: 7.2 10*3/uL (ref 4.0–10.5)

## 2015-06-15 LAB — SEDIMENTATION RATE: SED RATE: 18 mm/h (ref 0–20)

## 2015-06-15 LAB — C-REACTIVE PROTEIN: CRP: 1.5 mg/dL — ABNORMAL HIGH (ref <0.60)

## 2015-06-18 LAB — CLOSTRIDIUM DIFFICILE BY PCR: Toxigenic C. Difficile by PCR: NOT DETECTED

## 2015-06-18 LAB — GIARDIA ANTIGEN: Giardia Screen (EIA): NEGATIVE

## 2015-06-20 LAB — STOOL CULTURE

## 2015-07-03 ENCOUNTER — Ambulatory Visit (INDEPENDENT_AMBULATORY_CARE_PROVIDER_SITE_OTHER): Payer: Medicare Other | Admitting: Nurse Practitioner

## 2015-07-03 ENCOUNTER — Encounter: Payer: Self-pay | Admitting: Nurse Practitioner

## 2015-07-03 VITALS — BP 127/72 | HR 71 | Temp 98.3°F | Ht 71.0 in | Wt 183.6 lb

## 2015-07-03 DIAGNOSIS — K51819 Other ulcerative colitis with unspecified complications: Secondary | ICD-10-CM | POA: Diagnosis not present

## 2015-07-03 MED ORDER — PREDNISONE 10 MG PO TABS: ORAL_TABLET | ORAL | Status: DC

## 2015-07-03 NOTE — Patient Instructions (Signed)
1. I sent in a prescription for prednisone tablets. Take as follows: 1. 40 mg (4 tabs) once daily for 10 days 2. 30 mg (3 tabs) once daily for 14 days 3. 20 mg (2 tabs) once daily for 14 days 4. 10 mg (1 tab) once daily for 14 days 5. 5 mg (1/2 tab) once daily for 8 days 2. Call us if he did not note any improvement in the next 2 weeks. 3. Return for follow-up in 4 weeks. 4. Call for any severe symptoms that develop at any point.

## 2015-07-03 NOTE — Progress Notes (Signed)
Referring Provider: No ref. provider found Primary Care Physician:  Pcp Not In System Primary GI:  Dr. Gala Romney  Chief Complaint  Patient presents with  . Follow-up    HPI:   69 year old male presents for follow-up on ulcerative colitis. Last office visit 05/17/2015 when he stated he was doing quite well on the out to 4.8 g per day. However he called approximately a month later complaining of loose stools, some blood noted in his stools. Labs are ordered including CBC, stool studies, CRP, ESR. All studies were negative, CBC and CMP normal, CRP was mildly elevated at 1.5 with no baseline level available.  Today he states he's continuing to have loose stools, 5-8 a day. Also with occasional blood noted small amount. Also with a feeling with fullness. He states he used to take Aleve for joint pains but was told to stop taking it and was taking Tylenol as well that did not help. Took a natural alternative that he cannot remember the name. His flare started after starting that. Admits occasional abdominal pain "like when you have to go to the bathroom." Denies N/V. Denies chest pain, dyspnea, dizziness, lightheadedness, syncope, near syncope. Denies any other upper or lower GI symptoms. Admits subjective weight loss of 5 pounds in the past 2 weeks.  Past Medical History  Diagnosis Date  . Hypertension   . Thyroid disease   . Proctocolitis ([place])     On Lialda    Past Surgical History  Procedure Laterality Date  . Lung surgery    . Back surgery    . 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 Colonoscopy  March 2015    Dr. Britta Mccreedy: diminutive hyperplastic polyp. scope advanced to the cecum, no intubation of TI, no random biopsies performed  . Colonoscopy N/A 11/29/2014    Dr. Gala Romney: left-sided proctocolitis     Current Outpatient Prescriptions  Medication Sig Dispense Refill  . amLODipine (NORVASC) 10 MG tablet Take 10 mg by mouth every morning.  3  . cholecalciferol  (VITAMIN D) 1000 UNITS tablet Take 2,000 Units by mouth daily.    . hydrochlorothiazide (HYDRODIURIL) 25 MG tablet Take 25 mg by mouth daily.  3  . levothyroxine (SYNTHROID, LEVOTHROID) 75 MCG tablet Take 75 mcg by mouth daily before breakfast.    . mesalamine (LIALDA) 1.2 G EC tablet Take 4 tablets (4.8 g total) by mouth daily with breakfast. 360 tablet 1  . Omega-3 Fatty Acids (FISH OIL PO) Take 2 capsules by mouth daily.    . QC LORATADINE ALLERGY RELIEF 10 MG tablet Take 10 mg by mouth daily as needed for allergies.   3   No current facility-administered medications for this visit.    Allergies as of 07/03/2015  . (No Known Allergies)    Family History  Problem Relation Age of Onset  . Hypertension Mother   . Hypertension Other   . Colon cancer Neg Hx     Social History   Social History  . Marital Status: Married    Spouse Name: N/A  . Number of Children: N/A  . Years of Education: N/A   Social History Main Topics  . Smoking status: Former Smoker    Quit date: 02/14/1985  . Smokeless tobacco: Never Used  . Alcohol Use: 0.0 oz/week    0 Standard drinks or equivalent per week     Comment: occasional wine  . Drug Use: No  . Sexual Activity: Not Asked  Other Topics Concern  . None   Social History Narrative    Review of Systems: General: Negative for anorexia, weight loss, fever, chills, fatigue, weakness. Eyes: Negative for vision changes.  ENT: Negative for hoarseness, difficulty swallowing. Negative for mouth sores. CV: Negative for chest pain, angina, palpitations, dyspnea on exertion, peripheral edema.  Respiratory: Negative for dyspnea at rest, cough, sputum, wheezing.  GI: See history of present illness. MS: Negative for worsening joint pain..  Derm: Negative for rash or itching.  Endo: Negative for unusual weight change.  Heme: Negative for bruising or bleeding.   Physical Exam: BP 127/72 mmHg  Pulse 71  Temp(Src) 98.3 F (36.8 C) (Oral)  Ht 5'  11" (1.803 m)  Wt 183 lb 9.6 oz (83.28 kg)  BMI 25.62 kg/m2 General:   Alert and oriented. Pleasant and cooperative. Well-nourished and well-developed. Non-toxic appearing. Head:  Normocephalic and atraumatic. Eyes:  Without icterus, sclera clear and conjunctiva pink.  Ears:  Normal auditory acuity. Mouth:  No deformity or lesions, oral mucosa pink.  Cardiovascular:  S1, S2 present without murmurs appreciated. Extremities without clubbing or edema. Respiratory:  Clear to auscultation bilaterally. No wheezes, rales, or rhonchi. No distress.  Gastrointestinal:  +BS, soft, and non-distended. Mild left side and LLQ abdominal tenderness. No HSM noted. No guarding or rebound. No masses appreciated.  Rectal:  Deferred  Skin:  Intact without significant lesions or rashes. Neurologic:  Alert and oriented x4;  grossly normal neurologically. Psych:  Alert and cooperative. Normal mood and affect..    07/03/2015 10:16 AM

## 2015-07-03 NOTE — Assessment & Plan Note (Signed)
History of ulcerative colitis relatively well controlled on the out to 4.8 g a day. He is currently having what is most likely a flare of his ulcerative colitis. He is avoided NSAIDs as instructed, Tylenol does not work very well. He is tried a natural pain reliever recommended by his provider at the [facility] clinic, however he does not know the name of it. We started taking this is when he began having his flare symptoms. He is since stopped taking it. At this point we will schedule him for an 8 week prednisone taper. Taper instructions found in discharge instructions. We'll start a 40 mg a day for 10 days, 30 mg a day for 14 days, 20 mg a day for 14 days, 10 mg a day for 14 days, 5 mg a day for 8 days and then stop. He is to call us if he is having no improvement in the next 2 weeks. Return for follow-up in 4 weeks for assessment of symptoms.

## 2015-07-04 NOTE — Progress Notes (Signed)
No pcp per patient 

## 2015-08-07 ENCOUNTER — Ambulatory Visit: Payer: Medicare Other | Admitting: Nurse Practitioner

## 2015-08-07 ENCOUNTER — Telehealth: Payer: Self-pay | Admitting: Gastroenterology

## 2015-08-07 ENCOUNTER — Encounter: Payer: Self-pay | Admitting: Gastroenterology

## 2015-08-07 NOTE — Telephone Encounter (Signed)
Noted  

## 2015-08-07 NOTE — Telephone Encounter (Signed)
PATIENT WAS A NO SHOW AND LETTER SENT  °

## 2015-11-15 ENCOUNTER — Ambulatory Visit: Payer: Medicare Other | Admitting: Nurse Practitioner

## 2015-12-04 ENCOUNTER — Encounter: Payer: Self-pay | Admitting: Nurse Practitioner

## 2015-12-04 ENCOUNTER — Ambulatory Visit (INDEPENDENT_AMBULATORY_CARE_PROVIDER_SITE_OTHER): Payer: Medicare Other | Admitting: Nurse Practitioner

## 2015-12-04 VITALS — BP 146/87 | HR 69 | Temp 97.6°F | Ht 71.0 in | Wt 186.6 lb

## 2015-12-04 DIAGNOSIS — K51819 Other ulcerative colitis with unspecified complications: Secondary | ICD-10-CM

## 2015-12-04 NOTE — Progress Notes (Signed)
CC'ED TO PCP 

## 2015-12-04 NOTE — Patient Instructions (Signed)
1. We will request a copy of your labs from the [place]. 2. Return for follow-up in 6 months. 3. Call if any worsening symptoms.

## 2015-12-04 NOTE — Assessment & Plan Note (Signed)
No flare symptoms since his last visit. Symptoms resolved completely after 3 weeks of prednisone taper. Currently has a rash which is been persistent and he has been in touch with his primary care provider at the [place] related to this. I recommended he follow-up with them related to this rash. He just had labs drawn at the [place] last month and we will request these to be sent. We'll need to ensure he has had a CBC, renal function, liver function testing. Return for follow-up in 6 months.

## 2015-12-04 NOTE — Progress Notes (Signed)
Referring Provider: No ref. provider found Primary Care Physician:  Cleophas Dunker, MD Primary GI:  Dr. Gala Romney  Chief Complaint  Patient presents with  . Ulcerative Colitis    HPI:   Jerome Stark is a 70 y.o. male who presents for follow-up on ulcerative colitis. He was last seen in our office 07/03/2015. At that time he noted he was possibly having a flare of his ulcerative colitis with noted 5-8 loose stools a day and some increase in abdominal pain as well as small amount of blood in his stool. His abdominal pain was worse when he needed to have a bowel movement. At that visit he was started on a prednisone taper and recommended 4 week follow-up for close monitoring. He was a no-show for his follow-up appointment.  Today he states he's doing much better. Symptoms improved with steroid taper, completely resolved after 3 weeks. Has had no flare symptoms since. Rare loose stool which he attributes to dietary intake. Denies abdominal pain, N/V, diarrhea, hematochezia, melena, fever, chills, unintentional weight loss. He has a bit of a rash in some body crevices, has been in contact with PCP at [facility], has tried some suggestions including antifungal cream, with no improvement. Denies chest pain, dyspnea, dizziness, lightheadedness, syncope, near syncope. Denies any other upper or lower GI symptoms.  Past Medical History  Diagnosis Date  . Hypertension   . Thyroid disease   . 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 Lung surgery    . Back surgery    . 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 Colonoscopy  March 2015    Dr. Britta Mccreedy: diminutive hyperplastic polyp. scope advanced to the cecum, no intubation of TI, no random biopsies performed  . Colonoscopy N/A 11/29/2014    Dr. Gala Romney: left-sided proctocolitis     Current Outpatient Prescriptions  Medication Sig Dispense Refill  . amLODipine (NORVASC) 10 MG tablet Take 10 mg  by mouth every morning.  3  . cholecalciferol (VITAMIN D) 1000 UNITS tablet Take 2,000 Units by mouth daily.    . hydrochlorothiazide (HYDRODIURIL) 25 MG tablet Take 25 mg by mouth daily.  3  . levothyroxine (SYNTHROID, LEVOTHROID) 75 MCG tablet Take 75 mcg by mouth daily before breakfast.    . mesalamine (LIALDA) 1.2 G EC tablet Take 4 tablets (4.8 g total) by mouth daily with breakfast. 360 tablet 1  . Omega-3 Fatty Acids (FISH OIL PO) Take 2 capsules by mouth daily.    . QC LORATADINE ALLERGY RELIEF 10 MG tablet Take 10 mg by mouth daily as needed for allergies.   3   No current facility-administered medications for this visit.    Allergies as of 12/04/2015  . (No Known Allergies)    Family History  Problem Relation Age of Onset  . Hypertension Mother   . Hypertension Other   . Colon cancer Neg Hx     Social History   Social History  . Marital Status: Married    Spouse Name: N/A  . Number of Children: N/A  . Years of Education: N/A   Social History Main Topics  . Smoking status: Former Smoker    Quit date: 02/14/1985  . Smokeless tobacco: Never Used  . Alcohol Use: 0.0 oz/week    0 Standard drinks or equivalent per week     Comment: occasional wine  . Drug Use: No  .  Sexual Activity: Not Asked   Other Topics Concern  . None   Social History Narrative    Review of Systems: General: Negative for anorexia, weight loss, fever, chills, fatigue, weakness. ENT: Negative for hoarseness, difficulty swallowing. CV: Negative for chest pain, angina, palpitations, peripheral edema.  Respiratory: Negative for dyspnea at rest, cough, sputum, wheezing.  GI: See history of present illness. MS: [facility]  Derm: Admits rash in the groin crevices and superior gluteal fold.  Endo: Negative for unusual weight change.    Physical Exam: BP 146/87 mmHg  Pulse 69  Temp(Src) 97.6 F (36.4 C) (Oral)  Ht 5' 11"  (1.803 m)  Wt 186 lb 9.6 oz (84.641  kg)  BMI 26.04 kg/m2 General:   Alert and oriented. Pleasant and cooperative. Well-nourished and well-developed.  Ears:  Normal auditory acuity. Cardiovascular:  S1, S2 present without murmurs appreciated.  Extremities without clubbing or edema. Respiratory:  Clear to auscultation bilaterally. No wheezes, rales, or rhonchi. No distress.  Gastrointestinal:  +BS, soft, non-tender and non-distended. No guarding or rebound.  Rectal:  Deferred  Musculoskalatal:  Symmetrical without gross deformities. Skin:  Intact without significant lesions or rashes. Neurologic:  Alert and oriented x4;  grossly normal neurologically. Psych:  Alert and cooperative. Normal mood and affect. Heme/Lymph/Immune: No excessive bruising noted.    12/04/2015 9:03 AM   Disclaimer: This note was dictated with voice recognition software. Similar sounding words can inadvertently be transcribed and may not be corrected upon review.

## 2016-06-05 ENCOUNTER — Ambulatory Visit: Payer: Medicare Other | Admitting: Nurse Practitioner

## 2016-06-05 ENCOUNTER — Encounter: Payer: Self-pay | Admitting: Nurse Practitioner

## 2016-06-05 ENCOUNTER — Telehealth: Payer: Self-pay | Admitting: Nurse Practitioner

## 2016-06-05 NOTE — Telephone Encounter (Signed)
PT WAS A NO SHOW AND LETTER SENT

## 2016-06-05 NOTE — Telephone Encounter (Signed)
Noted  

## 2016-11-19 ENCOUNTER — Ambulatory Visit (INDEPENDENT_AMBULATORY_CARE_PROVIDER_SITE_OTHER): Payer: Non-veteran care | Admitting: Physician Assistant

## 2016-11-19 ENCOUNTER — Encounter: Payer: Self-pay | Admitting: Physician Assistant

## 2016-11-19 VITALS — BP 143/85 | HR 76 | Ht 71.0 in | Wt 190.0 lb

## 2016-11-19 DIAGNOSIS — Z01810 Encounter for preprocedural cardiovascular examination: Secondary | ICD-10-CM | POA: Diagnosis not present

## 2016-11-19 DIAGNOSIS — I10 Essential (primary) hypertension: Secondary | ICD-10-CM | POA: Diagnosis not present

## 2016-11-19 DIAGNOSIS — E039 Hypothyroidism, unspecified: Secondary | ICD-10-CM | POA: Diagnosis not present

## 2016-11-19 NOTE — Patient Instructions (Addendum)
Your physician recommends that you follow-up as needed.

## 2016-11-19 NOTE — Progress Notes (Signed)
Cardiology Office Note    Date:  11/20/2016   ID:  Jerome Stark, DOB 1946-01-04, MRN [id number]  PCP:  Pcp Not In System  Cardiologist:  New - case discussed with Dr. Quay Stark   Chief Complaint  Patient presents with  . New Evaluation    surgical clearance, knee surgery comin up    History of Present Illness:  Jerome Stark is a 71 y.o. male with PMH of idiopathic proctitis in 2003, hypothyroidism, ulcerative colitids and HTN. He denies any significant past cardiac history. His mother did have bypass surgery at age 7. His father died at age 9 during a farm accident. His maternal grandfather died in his late 31s from an MI. His younger brother Jerome Stark had a stent placement at age 99. Despite significant family history of coronary artery disease, he himself has never had any kind of cardiac issue. He did smoke for 25 years and quit around age 5. Otherwise, he denies any recent chest discomfort or exertional dyspnea. He is able to climb up 2 flights of stairs and walk 4 blocks away from his house without any issue. Therefore he is able to complete at least 4 METs without any problem. He denies any prior cardiac workup. EKG today in the office was normal.   He has upcoming right total knee arthroplasty by Dr. Henderson Stark. I have discussed this case with Dr. Quay Stark, Jerome Stark, given normal EKG and good exercise ability without any chest discomfort, no additional workup was felt to be necessary. He is cleared from cardiac perspective for the intended surgery. He is a low risk patient. After the surgery, he can follow-up with his primary care physician. He can contact cardiology and follow-up on an as-needed basis.  Note, this appointment was self-referred by the patient and his wife for the clearance given his family history of coronary artery disease. [facility]    Past Medical History:  Diagnosis Date  . Hypertension   . 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 Thyroid disease     Past Surgical History:  Procedure Laterality Date  . BACK SURGERY    . 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 COLONOSCOPY  March 2015   Dr. Britta Stark: diminutive hyperplastic polyp. scope advanced to the cecum, no intubation of TI, no random biopsies performed  . COLONOSCOPY N/A 11/29/2014   Dr. Gala Stark: left-sided proctocolitis   . LUNG SURGERY      Current Medications: Outpatient Medications Prior to Visit  Medication Sig Dispense Refill  . amLODipine (NORVASC) 10 MG tablet Take 10 mg by mouth every morning.  3  . cholecalciferol (VITAMIN D) 1000 UNITS tablet Take 2,000 Units by mouth daily.    . hydrochlorothiazide (HYDRODIURIL) 25 MG tablet Take 25 mg by mouth daily.  3  . levothyroxine (SYNTHROID, LEVOTHROID) 75 MCG tablet Take 75 mcg by mouth daily before breakfast.    . mesalamine (LIALDA) 1.2 G EC tablet Take 4 tablets (4.8 g total) by mouth daily with breakfast. 360 tablet 1  . Omega-3 Fatty Acids (FISH OIL PO) Take 2 capsules by mouth daily.    . QC LORATADINE ALLERGY RELIEF 10 MG tablet Take 10 mg by mouth daily as needed for allergies.   3   No facility-administered medications prior to visit.      Allergies:   Patient has no known allergies.   Social History   Social History  .  Marital status: Married    Spouse name: N/A  . Number of children: N/A  . Years of education: N/A   Social History Main Topics  . Smoking status: Former Smoker    Quit date: 02/14/1985  . Smokeless tobacco: Never Used     Comment: smoked 1 ppd for 25 years, quit at age 19  . Alcohol use 0.0 oz/week     Comment: occasional wine  . Drug use: No  . Sexual activity: Not Asked   Other Topics Concern  . None   Social History Narrative  . None     Family History:  The patient's family history includes Heart attack in his brother and maternal grandfather; Heart disease in his mother; Hypertension in his mother and other.   ROS:   Please see  the history of present illness.    ROS All other systems reviewed and are negative.   PHYSICAL EXAM:   VS:  BP (!) 143/85 (BP Location: Left Arm, Patient Position: Sitting, Cuff Size: Normal)   Pulse 76   Ht 5' 11"  (1.803 m)   Wt 190 lb (86.2 kg)   BMI 26.50 kg/m    GEN: Well nourished, well developed, in no acute distress  HEENT: normal  Neck: no JVD, carotid bruits, or masses Cardiac: RRR; no murmurs, rubs, or gallops,no edema  Respiratory:  clear to auscultation bilaterally, normal work of breathing GI: soft, nontender, nondistended, + BS MS: no deformity or atrophy  Skin: warm and dry, no rash Neuro:  Alert and Oriented x 3, Strength and sensation are intact Psych: euthymic mood, full affect  Wt Readings from Last 3 Encounters:  11/19/16 190 lb (86.2 kg)  12/04/15 186 lb 9.6 oz (84.6 kg)  07/03/15 183 lb 9.6 oz (83.3 kg)      Studies/Labs Reviewed:   EKG:  EKG is ordered today.  The ekg ordered today demonstrates Normal sinus rhythm without significant ST-T wave changes.  Recent Labs: No results Stark for requested labs within last 8760 hours.   Lipid Panel No results Stark for: CHOL, TRIG, HDL, CHOLHDL, VLDL, LDLCALC, LDLDIRECT  Additional studies/ records that were reviewed today include:   EKG   ASSESSMENT:    1. Preprocedural cardiovascular examination   2. Hypothyroidism, unspecified type   3. Essential hypertension      PLAN:  In order of problems listed above:  1. Preop cardiovascular clearance and examination: Patient has significant family history of coronary artery disease, however he himself has never had any cardiac issues in the past. It appears he self referred him to cardiology service for initial assessment and also for clearance. He is able to complete at least 4 METs without any problem. His EKG is normal. Despite his knee, he has fairly good activity level. I have discussed the case with the Jerome Stark Jerome Stark, no further workup is needed in this  case. He is cleared from cardiology perspective to proceed with surgery. He is a low risk patient for the intended procedure. After surgery, he can follow-up with his primary care physician and to see cardiology on an as-needed basis.  2. HTN: Blood pressure mildly elevated in the 140s on hydrochlorothiazide and amlodipine. He does not have a blood pressure diary with him, would recommend keep a blood pressure diary, if his blood pressure is still elevated at home, then adjust medication.  3. Hypothyroidism: On Synthroid    Medication Adjustments/Labs and Tests Ordered: Current medicines are reviewed at length with the patient today.  Concerns regarding medicines are outlined above.  Medication changes, Labs and Tests ordered today are listed in the Patient Instructions below. Patient Instructions  Your physician recommends that you follow-up as needed.    Hilbert Corrigan, [place]  11/20/2016 6:28 AM    [address], [place],   [address] Phone: [phone]; Fax: [phone]

## 2016-11-20 ENCOUNTER — Encounter: Payer: Self-pay | Admitting: Physician Assistant

## 2016-12-03 ENCOUNTER — Telehealth: Payer: Self-pay | Admitting: Physician Assistant

## 2016-12-03 NOTE — Telephone Encounter (Signed)
Faxed Release signed by patient to [facility] to obtain records per Almyra Deforest, [place].

## 2016-12-08 ENCOUNTER — Telehealth: Payer: Self-pay | Admitting: Cardiovascular Disease

## 2016-12-08 NOTE — Telephone Encounter (Signed)
Received records from [facility] as requested by Almyra Deforest.  Records given to Dr Gwenlyn Found for review. lp

## 2016-12-12 ENCOUNTER — Ambulatory Visit: Payer: Self-pay | Admitting: Orthopedic Surgery

## 2016-12-12 NOTE — Progress Notes (Signed)
Please place orders in EPIC as patient is being scheduled for a pre-op appointment! Thank you! 

## 2016-12-15 ENCOUNTER — Ambulatory Visit: Payer: Self-pay | Admitting: Orthopedic Surgery

## 2016-12-24 ENCOUNTER — Encounter: Payer: Self-pay | Admitting: Physician Assistant

## 2016-12-25 ENCOUNTER — Ambulatory Visit: Payer: Self-pay | Admitting: Orthopedic Surgery

## 2016-12-25 NOTE — H&P (Signed)
TOTAL KNEE ADMISSION H&P  Patient is being admitted for right total knee arthroplasty.  Subjective:  Chief Complaint:right knee pain.  HPI: Jerome Stark, 71 y.o. male, has a history of pain and functional disability in the right knee due to arthritis and has failed non-surgical conservative treatments for greater than 12 weeks to includeNSAID's and/or analgesics, corticosteriod injections, viscosupplementation injections, flexibility and strengthening excercises, use of assistive devices, weight reduction as appropriate and activity modification.  Onset of symptoms was gradual, starting 2 years ago with gradually worsening course since that time. The patient noted no past surgery on the right knee(s).  Patient currently rates pain in the right knee(s) at 10 out of 10 with activity. Patient has night pain, worsening of pain with activity and weight bearing, pain that interferes with activities of daily living, pain with passive range of motion, crepitus and joint swelling.  Patient has evidence of subchondral cysts, subchondral sclerosis, periarticular osteophytes and joint space narrowing by imaging studies. There is no active infection.  Patient Active Problem List   Diagnosis Date Noted  . Ulcerative colitis ([place])   . Protein-calorie malnutrition, severe ([place]) 11/28/2014  . Hypothyroidism 11/27/2014  . Benign essential HTN 11/27/2014  . Colitis 11/26/2014   Past Medical History:  Diagnosis Date  . Hyperlipidemia   . Hypertension   . Hypothyroidism   . 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 Ulcerative colitis [facility])     Past Surgical History:  Procedure Laterality Date  . BACK SURGERY    . 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 COLONOSCOPY  March 2015   Dr. Britta Mccreedy: diminutive hyperplastic polyp. scope advanced to the cecum, no intubation of TI, no random biopsies performed  . COLONOSCOPY N/A 11/29/2014   Dr. Gala Romney: left-sided proctocolitis   . LUNG SURGERY       (Not in  a hospital admission) No Known Allergies  Social History  Substance Use Topics  . Smoking status: Former Smoker    Quit date: 02/14/1985  . Smokeless tobacco: Never Used     Comment: smoked 1 ppd for 25 years, quit at age 18  . Alcohol use 0.0 oz/week     Comment: occasional wine    Family History  Problem Relation Age of Onset  . Hypertension Mother   . Heart disease Mother   . Hypertension Other   . Heart attack Brother        coronary artery stent at age 64  . Heart attack Maternal Grandfather        died of MI in late 48s  . Colon cancer Neg Hx      Review of Systems  Constitutional: Negative.   HENT: Negative.   Eyes: Negative.   Respiratory: Negative.   Cardiovascular: Negative.   Gastrointestinal: Negative.   Genitourinary: Negative.   Musculoskeletal: Positive for joint pain.  Skin: Negative.   Neurological: Negative.   Endo/Heme/Allergies: Negative.   Psychiatric/Behavioral: Negative.     Objective:  Physical Exam  Vitals reviewed. Constitutional: He is oriented to person, place, and time. He appears well-developed and well-nourished.  HENT:  Head: Normocephalic and atraumatic.  Eyes: Conjunctivae and EOM are normal. Pupils are equal, round, and reactive to light.  Neck: Normal range of motion. Neck supple.  Cardiovascular: Normal rate, regular rhythm and intact distal pulses.   Respiratory: Effort normal. No respiratory distress.  GI: He exhibits no distension.  Genitourinary:  Genitourinary Comments: deferred  Musculoskeletal:  Right knee: He exhibits decreased range of motion, swelling, effusion and deformity. Tenderness found. Medial joint line and lateral joint line tenderness noted.  Neurological: He is alert and oriented to person, place, and time. He has normal reflexes.  Skin: Skin is warm and dry.  Psychiatric: He has a normal mood and affect. His behavior is normal. Judgment and thought content normal.    Vital signs in last 24  hours: @VSRANGES @  Labs:   Estimated body mass index is 26.5 kg/m as calculated from the following:   Height as of 11/19/16: 5' 11"  (1.803 m).   Weight as of 11/19/16: 86.2 kg (190 lb).   Imaging Review Plain radiographs demonstrate severe degenerative joint disease of the right knee(s). The overall alignment issignificant varus. The bone quality appears to be adequate for age and reported activity level.  Assessment/Plan:  End stage arthritis, right knee   The patient history, physical examination, clinical judgment of the provider and imaging studies are consistent with end stage degenerative joint disease of the right knee(s) and total knee arthroplasty is deemed medically necessary. The treatment options including medical management, injection therapy arthroscopy and arthroplasty were discussed at length. The risks and benefits of total knee arthroplasty were presented and reviewed. The risks due to aseptic loosening, infection, stiffness, patella tracking problems, thromboembolic complications and other imponderables were discussed. The patient acknowledged the explanation, agreed to proceed with the plan and consent was signed. Patient is being admitted for inpatient treatment for surgery, pain control, PT, OT, prophylactic antibiotics, VTE prophylaxis, progressive ambulation and ADL's and discharge planning. The patient is planning to be discharged home with home health services. Already has DME.

## 2016-12-31 ENCOUNTER — Other Ambulatory Visit (HOSPITAL_COMMUNITY): Payer: Self-pay | Admitting: Emergency Medicine

## 2016-12-31 NOTE — Patient Instructions (Signed)
Jerome Stark  12/31/2016   Your procedure is scheduled on: 01-08-17  Report to [place]  elevators to 3rd floor to  [place] at 10:45AM.   Call this number if you have problems the morning of surgery [phone]   Remember: ONLY 1 PERSON MAY GO WITH YOU TO SHORT STAY TO GET  READY MORNING OF [place].  Do not eat food or drink liquids :After Midnight.     Take these medicines the morning of surgery with A SIP OF WATER: amlodipine(norvasc), levothyroxine(synthroid), loratadine as needed                                You may not have any metal on your body including hair pins and              piercings  Do not wear jewelry, make-up, lotions, powders or perfumes, deodorant              Men may shave face and neck.   Do not bring valuables to the hospital. [place].  Contacts, dentures or bridgework may not be worn into surgery.  Leave suitcase in the car. After surgery it may be brought to your room.               Please read over the following fact sheets you were given: _____________________________________________________________________           [facility] - Preparing for Surgery Before surgery, you can play an important role.  Because skin is not sterile, your skin needs to be as free of germs as possible.  You can reduce the number of germs on your skin by washing with CHG (chlorahexidine gluconate) soap before surgery.  CHG is an antiseptic cleaner which kills germs and bonds with the skin to continue killing germs even after washing. Please DO NOT use if you have an allergy to CHG or antibacterial soaps.  If your skin becomes reddened/irritated stop using the CHG and inform your nurse when you arrive at Short Stay. Do not shave (including legs and underarms) for at least 48 hours prior to the first CHG shower.  You may shave your face/neck. Please follow these  instructions carefully:  1.  Shower with CHG Soap the night before surgery and the  morning of Surgery.  2.  If you choose to wash your hair, wash your hair first as usual with your  normal  shampoo.  3.  After you shampoo, rinse your hair and body thoroughly to remove the  shampoo.                           4.  Use CHG as you would any other liquid soap.  You can apply chg directly  to the skin and wash                       Gently with a scrungie or clean washcloth.  5.  Apply the CHG Soap to your body ONLY FROM THE NECK DOWN.   Do not use on face/ open  Wound or open sores. Avoid contact with eyes, ears mouth and genitals (private parts).                       Wash face,  Genitals (private parts) with your normal soap.             6.  Wash thoroughly, paying special attention to the area where your surgery  will be performed.  7.  Thoroughly rinse your body with warm water from the neck down.  8.  DO NOT shower/wash with your normal soap after using and rinsing off  the CHG Soap.                9.  Pat yourself dry with a clean towel.            10.  Wear clean pajamas.            11.  Place clean sheets on your bed the night of your first shower and do not  sleep with pets. Day of Surgery : Do not apply any lotions/deodorants the morning of surgery.  Please wear clean clothes to the hospital/surgery center.  FAILURE TO FOLLOW THESE INSTRUCTIONS MAY RESULT IN THE CANCELLATION OF YOUR SURGERY PATIENT SIGNATURE_________________________________  NURSE SIGNATURE__________________________________  ________________________________________________________________________   Jerome Stark  An incentive spirometer is a tool that can help keep your lungs clear and active. This tool measures how well you are filling your lungs with each breath. Taking long deep breaths may help reverse or decrease the chance of developing breathing (pulmonary) problems (especially  infection) following:  A long period of time when you are unable to move or be active. BEFORE THE PROCEDURE   If the spirometer includes an indicator to show your best effort, your nurse or respiratory therapist will set it to a desired goal.  If possible, sit up straight or lean slightly forward. Try not to slouch.  Hold the incentive spirometer in an upright position. INSTRUCTIONS FOR USE  1. Sit on the edge of your bed if possible, or sit up as far as you can in bed or on a chair. 2. Hold the incentive spirometer in an upright position. 3. Breathe out normally. 4. Place the mouthpiece in your mouth and seal your lips tightly around it. 5. Breathe in slowly and as deeply as possible, raising the piston or the ball toward the top of the column. 6. Hold your breath for 3-5 seconds or for as long as possible. Allow the piston or ball to fall to the bottom of the column. 7. Remove the mouthpiece from your mouth and breathe out normally. 8. Rest for a few seconds and repeat Steps 1 through 7 at least 10 times every 1-2 hours when you are awake. Take your time and take a few normal breaths between deep breaths. 9. The spirometer may include an indicator to show your best effort. Use the indicator as a goal to work toward during each repetition. 10. After each set of 10 deep breaths, practice coughing to be sure your lungs are clear. If you have an incision (the cut made at the time of surgery), support your incision when coughing by placing a pillow or rolled up towels firmly against it. Once you are able to get out of bed, walk around indoors and cough well. You may stop using the incentive spirometer when instructed by your caregiver.  RISKS AND COMPLICATIONS  Take your time so you do not get  dizzy or light-headed.  If you are in pain, you may need to take or ask for pain medication before doing incentive spirometry. It is harder to take a deep breath if you are having pain. AFTER  USE  Rest and breathe slowly and easily.  It can be helpful to keep track of a log of your progress. Your caregiver can provide you with a simple table to help with this. If you are using the spirometer at home, follow these instructions: [place] IF:   You are having difficultly using the spirometer.  You have trouble using the spirometer as often as instructed.  Your pain medication is not giving enough relief while using the spirometer.  You develop fever of 100.5 F (38.1 C) or higher. SEEK IMMEDIATE MEDICAL CARE IF:   You cough up bloody sputum that had not been present before.  You develop fever of 102 F (38.9 C) or greater.  You develop worsening pain at or near the incision site. MAKE SURE YOU:   Understand these instructions.  Will watch your condition.  Will get help right away if you are not doing well or get worse. Document Released: 12/01/2006 Document Revised: 10/13/2011 Document Reviewed: 02/01/2007 ExitCare Patient Information 2014 Exit[employer], [place].   ________________________________________________________________________  WHAT IS A BLOOD TRANSFUSION? Blood Transfusion Information  A transfusion is the replacement of blood or some of its parts. Blood is made up of multiple cells which provide different functions.  Red blood cells carry oxygen and are used for blood loss replacement.  White blood cells fight against infection.  Platelets control bleeding.  Plasma helps clot blood.  Other blood products are available for specialized needs, such as hemophilia or other clotting disorders. BEFORE THE TRANSFUSION  Who gives blood for transfusions?   Healthy volunteers who are fully evaluated to make sure their blood is safe. This is blood bank blood. Transfusion therapy is the safest it has ever been in the practice of medicine. Before blood is taken from a donor, a complete history is taken to make sure that person has no history of diseases  nor engages in risky social behavior (examples are intravenous drug use or sexual activity with multiple partners). The donor's travel history is screened to minimize risk of transmitting infections, such as malaria. The donated blood is tested for signs of infectious diseases, such as HIV and hepatitis. The blood is then tested to be sure it is compatible with you in order to minimize the chance of a transfusion reaction. If you or a relative donates blood, this is often done in anticipation of surgery and is not appropriate for emergency situations. It takes many days to process the donated blood. RISKS AND COMPLICATIONS Although transfusion therapy is very safe and saves many lives, the main dangers of transfusion include:   Getting an infectious disease.  Developing a transfusion reaction. This is an allergic reaction to something in the blood you were given. Every precaution is taken to prevent this. The decision to have a blood transfusion has been considered carefully by your caregiver before blood is given. Blood is not given unless the benefits outweigh the risks. AFTER THE TRANSFUSION  Right after receiving a blood transfusion, you will usually feel much better and more energetic. This is especially true if your red blood cells have gotten low (anemic). The transfusion raises the level of the red blood cells which carry oxygen, and this usually causes an energy increase.  The nurse administering the transfusion will  monitor you carefully for complications. HOME CARE INSTRUCTIONS  No special instructions are needed after a transfusion. You may find your energy is better. Speak with your caregiver about any limitations on activity for underlying diseases you may have. SEEK MEDICAL CARE IF:   Your condition is not improving after your transfusion.  You develop redness or irritation at the intravenous (IV) site. SEEK IMMEDIATE MEDICAL CARE IF:  Any of the following symptoms occur over the  next 12 hours:  Shaking chills.  You have a temperature by mouth above 102 F (38.9 C), not controlled by medicine.  Chest, back, or muscle pain.  People around you feel you are not acting correctly or are confused.  Shortness of breath or difficulty breathing.  Dizziness and fainting.  You get a rash or develop hives.  You have a decrease in urine output.  Your urine turns a dark color or changes to pink, red, or brown. Any of the following symptoms occur over the next 10 days:  You have a temperature by mouth above 102 F (38.9 C), not controlled by medicine.  Shortness of breath.  Weakness after normal activity.  The white part of the eye turns yellow (jaundice).  You have a decrease in the amount of urine or are urinating less often.  Your urine turns a dark color or changes to pink, red, or brown. Document Released: 07/18/2000 Document Revised: 10/13/2011 Document Reviewed: 03/06/2008 [facility] Patient Information 2014 [place], [place].  _______________________________________________________________________

## 2016-12-31 NOTE — Progress Notes (Signed)
Cardiology clearance Delrae Sawyers and  Gwenlyn Found MD 11-19-16 on chart  Girtha Rm [place] 11-19-16 epic/chart  EKG 11-19-16 epic

## 2017-01-02 ENCOUNTER — Encounter (INDEPENDENT_AMBULATORY_CARE_PROVIDER_SITE_OTHER): Payer: Self-pay

## 2017-01-02 ENCOUNTER — Encounter (HOSPITAL_COMMUNITY)
Admission: RE | Admit: 2017-01-02 | Discharge: 2017-01-02 | Disposition: A | Discharge status: Home / Self Care | Payer: Non-veteran care | Source: Ambulatory Visit | Attending: Orthopedic Surgery | Admitting: Orthopedic Surgery

## 2017-01-02 ENCOUNTER — Encounter (HOSPITAL_COMMUNITY): Payer: Self-pay

## 2017-01-02 DIAGNOSIS — Z0183 Encounter for blood typing: Secondary | ICD-10-CM | POA: Diagnosis not present

## 2017-01-02 DIAGNOSIS — M1711 Unilateral primary osteoarthritis, right knee: Secondary | ICD-10-CM | POA: Diagnosis not present

## 2017-01-02 DIAGNOSIS — Z01812 Encounter for preprocedural laboratory examination: Secondary | ICD-10-CM | POA: Diagnosis present

## 2017-01-02 LAB — SURGICAL PCR SCREEN
MRSA, PCR: NEGATIVE
Staphylococcus aureus: NEGATIVE

## 2017-01-02 LAB — BASIC METABOLIC PANEL
Anion gap: 4 — ABNORMAL LOW (ref 5–15)
BUN: 12 mg/dL (ref 6–20)
CALCIUM: 9.1 mg/dL (ref 8.9–10.3)
CHLORIDE: 110 mmol/L (ref 101–111)
CO2: 30 mmol/L (ref 22–32)
CREATININE: 1.01 mg/dL (ref 0.61–1.24)
GFR calc non Af Amer: >60 mL/min (ref >60)
GLUCOSE: 108 mg/dL — AB (ref 65–99)
Potassium: 4.4 mmol/L (ref 3.5–5.1)
Sodium: 144 mmol/L (ref 135–145)

## 2017-01-02 LAB — CBC
HCT: 43.9 % (ref 39.0–52.0)
Hemoglobin: 15.1 g/dL (ref 13.0–17.0)
MCH: 31.5 pg (ref 26.0–34.0)
MCHC: 34.4 g/dL (ref 30.0–36.0)
MCV: 91.5 fL (ref 78.0–100.0)
PLATELETS: 182 10*3/uL (ref 150–400)
RBC: 4.8 MIL/uL (ref 4.22–5.81)
RDW: 13.7 % (ref 11.5–15.5)
WBC: 6.2 10*3/uL (ref 4.0–10.5)

## 2017-01-02 LAB — ABO/RH: ABO/RH(D): B POS

## 2017-01-08 ENCOUNTER — Encounter (HOSPITAL_COMMUNITY): Payer: Self-pay | Admitting: Certified Registered Nurse Anesthetist

## 2017-01-08 ENCOUNTER — Inpatient Hospital Stay (HOSPITAL_COMMUNITY): Payer: Non-veteran care | Admitting: Certified Registered Nurse Anesthetist

## 2017-01-08 ENCOUNTER — Inpatient Hospital Stay (HOSPITAL_COMMUNITY): Payer: Non-veteran care

## 2017-01-08 ENCOUNTER — Encounter (HOSPITAL_COMMUNITY)
Admission: RE | Disposition: A | Discharge status: Home / Self Care | Payer: Self-pay | Source: Ambulatory Visit | Attending: Orthopedic Surgery

## 2017-01-08 ENCOUNTER — Observation Stay (HOSPITAL_COMMUNITY)
Admission: RE | Admit: 2017-01-08 | Discharge: 2017-01-09 | Disposition: A | Discharge status: Home / Self Care | Payer: Non-veteran care | Source: Ambulatory Visit | Attending: Orthopedic Surgery | Admitting: Orthopedic Surgery

## 2017-01-08 DIAGNOSIS — Z09 Encounter for follow-up examination after completed treatment for conditions other than malignant neoplasm: Secondary | ICD-10-CM

## 2017-01-08 DIAGNOSIS — E039 Hypothyroidism, unspecified: Secondary | ICD-10-CM | POA: Insufficient documentation

## 2017-01-08 DIAGNOSIS — Z79899 Other long term (current) drug therapy: Secondary | ICD-10-CM | POA: Insufficient documentation

## 2017-01-08 DIAGNOSIS — M1711 Unilateral primary osteoarthritis, right knee: Secondary | ICD-10-CM | POA: Diagnosis present

## 2017-01-08 DIAGNOSIS — I10 Essential (primary) hypertension: Secondary | ICD-10-CM | POA: Diagnosis not present

## 2017-01-08 DIAGNOSIS — Z87891 Personal history of nicotine dependence: Secondary | ICD-10-CM | POA: Insufficient documentation

## 2017-01-08 HISTORY — PX: KNEE ARTHROPLASTY: SHX992

## 2017-01-08 LAB — TYPE AND SCREEN
ABO/RH(D): B POS
ANTIBODY SCREEN: NEGATIVE

## 2017-01-08 SURGERY — ARTHROPLASTY, KNEE, TOTAL, USING IMAGELESS COMPUTER-ASSISTED NAVIGATION
Anesthesia: Spinal | Site: Knee | Laterality: Right

## 2017-01-08 MED ORDER — 0.9 % SODIUM CHLORIDE (POUR BTL) OPTIME
TOPICAL | Status: DC | PRN
  Administered 2017-01-08: 1000 mL

## 2017-01-08 MED ORDER — METHOCARBAMOL 1000 MG/10ML IJ SOLN
500.0000 mg | Freq: Four times a day (QID) | INTRAVENOUS | Status: DC | PRN
  Filled 2017-01-08: qty 5

## 2017-01-08 MED ORDER — BUPIVACAINE-EPINEPHRINE 0.25% -1:200000 IJ SOLN
INTRAMUSCULAR | Status: DC | PRN
  Administered 2017-01-08: 30 mL

## 2017-01-08 MED ORDER — PROMETHAZINE HCL 25 MG/ML IJ SOLN: 6.2500 mg | INTRAMUSCULAR | Status: DC | PRN

## 2017-01-08 MED ORDER — ONDANSETRON HCL 4 MG/2ML IJ SOLN
INTRAMUSCULAR | Status: DC | PRN
  Administered 2017-01-08: 4 mg via INTRAVENOUS

## 2017-01-08 MED ORDER — LACTATED RINGERS IV SOLN
INTRAVENOUS | Status: DC
  Administered 2017-01-08 (×3): via INTRAVENOUS

## 2017-01-08 MED ORDER — PHENYLEPHRINE HCL 10 MG/ML IJ SOLN
INTRAMUSCULAR | Status: DC | PRN
Start: 2017-01-08 — End: 2017-01-08
  Administered 2017-01-08 (×2): 100 ug via INTRAVENOUS

## 2017-01-08 MED ORDER — DIPHENHYDRAMINE HCL 12.5 MG/5ML PO ELIX: 12.5000 mg | ORAL_SOLUTION | ORAL | Status: DC | PRN

## 2017-01-08 MED ORDER — SODIUM CHLORIDE 0.9 % IJ SOLN
INTRAMUSCULAR | Status: AC
  Filled 2017-01-08: qty 50

## 2017-01-08 MED ORDER — MESALAMINE 1.2 G PO TBEC
4.8000 g | DELAYED_RELEASE_TABLET | Freq: Every day | ORAL | Status: DC
  Administered 2017-01-09: 4.8 g via ORAL
  Filled 2017-01-08: qty 4

## 2017-01-08 MED ORDER — POVIDONE-IODINE 10 % EX SWAB: 2.0000 "application " | Freq: Once | CUTANEOUS | Status: DC

## 2017-01-08 MED ORDER — SODIUM CHLORIDE 0.9 % IV SOLN: INTRAVENOUS | Status: DC

## 2017-01-08 MED ORDER — ACETAMINOPHEN 650 MG RE SUPP: 650.0000 mg | Freq: Four times a day (QID) | RECTAL | Status: DC | PRN

## 2017-01-08 MED ORDER — CHLORHEXIDINE GLUCONATE 4 % EX LIQD: 60.0000 mL | Freq: Once | CUTANEOUS | Status: DC

## 2017-01-08 MED ORDER — AMLODIPINE BESYLATE 10 MG PO TABS
10.0000 mg | ORAL_TABLET | Freq: Every morning | ORAL | Status: DC
  Administered 2017-01-09: 10 mg via ORAL
  Filled 2017-01-08: qty 1

## 2017-01-08 MED ORDER — METHOCARBAMOL 500 MG PO TABS
500.0000 mg | ORAL_TABLET | Freq: Four times a day (QID) | ORAL | Status: DC | PRN
  Administered 2017-01-09 (×3): 500 mg via ORAL
  Filled 2017-01-08 (×3): qty 1

## 2017-01-08 MED ORDER — SODIUM CHLORIDE 0.9 % IR SOLN
Status: DC | PRN
  Administered 2017-01-08: 4000 mL

## 2017-01-08 MED ORDER — HYDROMORPHONE HCL 1 MG/ML IJ SOLN: 0.5000 mg | INTRAMUSCULAR | Status: DC | PRN

## 2017-01-08 MED ORDER — HYDROCODONE-ACETAMINOPHEN 5-325 MG PO TABS
1.0000 | ORAL_TABLET | ORAL | Status: DC | PRN
  Administered 2017-01-08 – 2017-01-09 (×2): 1 via ORAL
  Administered 2017-01-09 (×2): 2 via ORAL
  Filled 2017-01-08: qty 2
  Filled 2017-01-08 (×3): qty 1
  Filled 2017-01-08: qty 2

## 2017-01-08 MED ORDER — ISOPROPYL ALCOHOL 70 % SOLN
Status: DC | PRN
  Administered 2017-01-08: 1 via TOPICAL

## 2017-01-08 MED ORDER — BUPIVACAINE HCL (PF) 0.5 % IJ SOLN
INTRAMUSCULAR | Status: DC | PRN
  Administered 2017-01-08: 3 mL via INTRATHECAL

## 2017-01-08 MED ORDER — METOCLOPRAMIDE HCL 5 MG/ML IJ SOLN: 5.0000 mg | Freq: Three times a day (TID) | INTRAMUSCULAR | Status: DC | PRN

## 2017-01-08 MED ORDER — ONDANSETRON HCL 4 MG PO TABS: 4.0000 mg | ORAL_TABLET | Freq: Four times a day (QID) | ORAL | Status: DC | PRN

## 2017-01-08 MED ORDER — LORATADINE 10 MG PO TABS: 10.0000 mg | ORAL_TABLET | Freq: Every day | ORAL | Status: DC | PRN

## 2017-01-08 MED ORDER — KETOROLAC TROMETHAMINE 30 MG/ML IJ SOLN
INTRAMUSCULAR | Status: DC | PRN
  Administered 2017-01-08: 30 mg

## 2017-01-08 MED ORDER — SODIUM CHLORIDE 0.9 % IJ SOLN
INTRAMUSCULAR | Status: DC | PRN
  Administered 2017-01-08: 29 mL

## 2017-01-08 MED ORDER — DEXAMETHASONE SODIUM PHOSPHATE 10 MG/ML IJ SOLN
10.0000 mg | Freq: Once | INTRAMUSCULAR | Status: AC
  Administered 2017-01-09: 11:00:00 10 mg via INTRAVENOUS
  Filled 2017-01-08: qty 1

## 2017-01-08 MED ORDER — CEFAZOLIN SODIUM-DEXTROSE 2-4 GM/100ML-% IV SOLN
2.0000 g | Freq: Four times a day (QID) | INTRAVENOUS | Status: AC
  Administered 2017-01-08 – 2017-01-09 (×2): 2 g via INTRAVENOUS
  Filled 2017-01-08 (×2): qty 100

## 2017-01-08 MED ORDER — MIDAZOLAM HCL 5 MG/5ML IJ SOLN
INTRAMUSCULAR | Status: DC | PRN
  Administered 2017-01-08: 1 mg via INTRAVENOUS

## 2017-01-08 MED ORDER — ACETAMINOPHEN 10 MG/ML IV SOLN
1000.0000 mg | INTRAVENOUS | Status: AC
Start: 2017-01-08 — End: 2017-01-08
  Administered 2017-01-08: 1000 mg via INTRAVENOUS
  Filled 2017-01-08: qty 100

## 2017-01-08 MED ORDER — FENTANYL CITRATE (PF) 100 MCG/2ML IJ SOLN
INTRAMUSCULAR | Status: DC | PRN
  Administered 2017-01-08: 50 ug via INTRAVENOUS

## 2017-01-08 MED ORDER — ACETAMINOPHEN 325 MG PO TABS: 650.0000 mg | ORAL_TABLET | Freq: Four times a day (QID) | ORAL | Status: DC | PRN

## 2017-01-08 MED ORDER — LEVOTHYROXINE SODIUM 75 MCG PO TABS
75.0000 ug | ORAL_TABLET | Freq: Every day | ORAL | Status: DC
  Administered 2017-01-09: 08:00:00 75 ug via ORAL
  Filled 2017-01-08: qty 1

## 2017-01-08 MED ORDER — ISOPROPYL ALCOHOL 70 % SOLN
Status: AC
  Filled 2017-01-08: qty 480

## 2017-01-08 MED ORDER — FENTANYL CITRATE (PF) 100 MCG/2ML IJ SOLN: 25.0000 ug | INTRAMUSCULAR | Status: DC | PRN

## 2017-01-08 MED ORDER — SENNA 8.6 MG PO TABS
2.0000 | ORAL_TABLET | Freq: Every day | ORAL | Status: DC
  Administered 2017-01-08: 21:00:00 17.2 mg via ORAL
  Filled 2017-01-08: qty 2

## 2017-01-08 MED ORDER — KETOROLAC TROMETHAMINE 30 MG/ML IJ SOLN
INTRAMUSCULAR | Status: AC
  Filled 2017-01-08: qty 1

## 2017-01-08 MED ORDER — DOCUSATE SODIUM 100 MG PO CAPS
100.0000 mg | ORAL_CAPSULE | Freq: Two times a day (BID) | ORAL | Status: DC
  Administered 2017-01-08 – 2017-01-09 (×2): 100 mg via ORAL
  Filled 2017-01-08 (×2): qty 1

## 2017-01-08 MED ORDER — ASPIRIN 81 MG PO CHEW
81.0000 mg | CHEWABLE_TABLET | Freq: Two times a day (BID) | ORAL | Status: DC
  Administered 2017-01-09: 11:00:00 81 mg via ORAL
  Filled 2017-01-08: qty 1

## 2017-01-08 MED ORDER — POLYETHYLENE GLYCOL 3350 17 G PO PACK: 17.0000 g | PACK | Freq: Every day | ORAL | Status: DC | PRN

## 2017-01-08 MED ORDER — ALUM & MAG HYDROXIDE-SIMETH 200-200-20 MG/5ML PO SUSP: 30.0000 mL | ORAL | Status: DC | PRN

## 2017-01-08 MED ORDER — FENTANYL CITRATE (PF) 100 MCG/2ML IJ SOLN
100.0000 ug | Freq: Once | INTRAMUSCULAR | Status: AC
  Administered 2017-01-08: 50 ug via INTRAVENOUS

## 2017-01-08 MED ORDER — CEFAZOLIN SODIUM-DEXTROSE 2-4 GM/100ML-% IV SOLN
2.0000 g | INTRAVENOUS | Status: AC
  Administered 2017-01-08: 2 g via INTRAVENOUS
  Filled 2017-01-08: qty 100

## 2017-01-08 MED ORDER — EPHEDRINE SULFATE 50 MG/ML IJ SOLN
INTRAMUSCULAR | Status: DC | PRN
  Administered 2017-01-08 (×4): 5 mg via INTRAVENOUS

## 2017-01-08 MED ORDER — PROPOFOL 500 MG/50ML IV EMUL
INTRAVENOUS | Status: DC | PRN
  Administered 2017-01-08: 100 ug/kg/min via INTRAVENOUS

## 2017-01-08 MED ORDER — KETOROLAC TROMETHAMINE 15 MG/ML IJ SOLN
15.0000 mg | Freq: Four times a day (QID) | INTRAMUSCULAR | Status: AC
  Administered 2017-01-08 – 2017-01-09 (×4): 15 mg via INTRAVENOUS
  Filled 2017-01-08 (×4): qty 1

## 2017-01-08 MED ORDER — ONDANSETRON HCL 4 MG/2ML IJ SOLN: 4.0000 mg | Freq: Four times a day (QID) | INTRAMUSCULAR | Status: DC | PRN

## 2017-01-08 MED ORDER — ROPIVACAINE HCL 5 MG/ML IJ SOLN
INTRAMUSCULAR | Status: DC | PRN
  Administered 2017-01-08: 30 mL via PERINEURAL

## 2017-01-08 MED ORDER — MIDAZOLAM HCL 2 MG/2ML IJ SOLN
2.0000 mg | Freq: Once | INTRAMUSCULAR | Status: AC
  Administered 2017-01-08: 1 mg via INTRAVENOUS

## 2017-01-08 MED ORDER — PHENOL 1.4 % MT LIQD: 1.0000 | OROMUCOSAL | Status: DC | PRN

## 2017-01-08 MED ORDER — SODIUM CHLORIDE 0.9 % IV SOLN
INTRAVENOUS | Status: DC
  Administered 2017-01-08 – 2017-01-09 (×2): via INTRAVENOUS

## 2017-01-08 MED ORDER — HYDROCHLOROTHIAZIDE 25 MG PO TABS
25.0000 mg | ORAL_TABLET | Freq: Every day | ORAL | Status: DC
  Administered 2017-01-09: 25 mg via ORAL
  Filled 2017-01-08: qty 1

## 2017-01-08 MED ORDER — SODIUM CHLORIDE 0.9 % IV SOLN
1000.0000 mg | Freq: Once | INTRAVENOUS | Status: AC
  Administered 2017-01-08: 1000 mg via INTRAVENOUS
  Filled 2017-01-08: qty 1100

## 2017-01-08 MED ORDER — BUPIVACAINE-EPINEPHRINE (PF) 0.25% -1:200000 IJ SOLN
INTRAMUSCULAR | Status: AC
  Filled 2017-01-08: qty 30

## 2017-01-08 MED ORDER — TRANEXAMIC ACID 1000 MG/10ML IV SOLN
1000.0000 mg | INTRAVENOUS | Status: AC
  Administered 2017-01-08: 1000 mg via INTRAVENOUS
  Filled 2017-01-08: qty 1100

## 2017-01-08 MED ORDER — MENTHOL 3 MG MT LOZG: 1.0000 | LOZENGE | OROMUCOSAL | Status: DC | PRN

## 2017-01-08 MED ORDER — METOCLOPRAMIDE HCL 5 MG PO TABS: 5.0000 mg | ORAL_TABLET | Freq: Three times a day (TID) | ORAL | Status: DC | PRN

## 2017-01-08 SURGICAL SUPPLY — 59 items
ADH SKN CLS APL DERMABOND .7 (GAUZE/BANDAGES/DRESSINGS) ×2
BAG SPEC THK2 15X12 ZIP CLS (MISCELLANEOUS) ×1
BAG ZIPLOCK 12X15 (MISCELLANEOUS) ×2 IMPLANT
BANDAGE ACE 6X5 VEL STRL LF (GAUZE/BANDAGES/DRESSINGS) ×3 IMPLANT
BLADE SAW RECIPROCATING 77.5 (BLADE) ×3 IMPLANT
CAPT KNEE TRIATH TK-4 ×2 IMPLANT
CHLORAPREP W/TINT 26ML (MISCELLANEOUS) ×6 IMPLANT
COVER SURGICAL LIGHT HANDLE (MISCELLANEOUS) ×3 IMPLANT
CUFF TOURN SGL QUICK 34 (TOURNIQUET CUFF) ×3
CUFF TRNQT CYL 34X4X40X1 (TOURNIQUET CUFF) ×1 IMPLANT
DERMABOND ADVANCED (GAUZE/BANDAGES/DRESSINGS) ×4
DERMABOND ADVANCED .7 DNX12 (GAUZE/BANDAGES/DRESSINGS) ×2 IMPLANT
DRAPE SHEET LG 3/4 BI-LAMINATE (DRAPES) ×6 IMPLANT
DRAPE U-SHAPE 47X51 STRL (DRAPES) ×3 IMPLANT
DRESSING AQUACEL AG SP 3.5X10 (GAUZE/BANDAGES/DRESSINGS) IMPLANT
DRSG AQUACEL AG SP 3.5X10 (GAUZE/BANDAGES/DRESSINGS) ×3
ELECT BLADE TIP CTD 4 INCH (ELECTRODE) ×3 IMPLANT
ELECT REM PT RETURN 15FT ADLT (MISCELLANEOUS) ×3 IMPLANT
EVACUATOR 1/8 PVC DRAIN (DRAIN) IMPLANT
GAUZE SPONGE 4X4 12PLY STRL (GAUZE/BANDAGES/DRESSINGS) ×3 IMPLANT
GLOVE BIO SURGEON STRL SZ8.5 (GLOVE) ×6 IMPLANT
GLOVE BIOGEL PI IND STRL 7.5 (GLOVE) IMPLANT
GLOVE BIOGEL PI IND STRL 8 (GLOVE) IMPLANT
GLOVE BIOGEL PI IND STRL 8.5 (GLOVE) ×1 IMPLANT
GLOVE BIOGEL PI INDICATOR 7.5 (GLOVE) ×6
GLOVE BIOGEL PI INDICATOR 8 (GLOVE) ×2
GLOVE BIOGEL PI INDICATOR 8.5 (GLOVE) ×2
GLOVE SURG SS PI 7.5 STRL IVOR (GLOVE) ×2 IMPLANT
GLOVE SURG SS PI 8.0 STRL IVOR (GLOVE) ×2 IMPLANT
GOWN SPEC L3 XXLG W/TWL (GOWN DISPOSABLE) ×5 IMPLANT
GOWN STRL REUS W/TWL 2XL LVL3 (GOWN DISPOSABLE) ×2 IMPLANT
HANDPIECE INTERPULSE COAX TIP (DISPOSABLE) ×3
HOOD PEEL AWAY FLYTE STAYCOOL (MISCELLANEOUS) ×6 IMPLANT
MARKER SKIN DUAL TIP RULER LAB (MISCELLANEOUS) ×3 IMPLANT
NDL SPNL 18GX3.5 QUINCKE PK (NEEDLE) ×1 IMPLANT
NEEDLE SPNL 18GX3.5 QUINCKE PK (NEEDLE) ×3 IMPLANT
PACK TOTAL KNEE CUSTOM (KITS) ×3 IMPLANT
PADDING CAST COTTON 6X4 STRL (CAST SUPPLIES) ×3 IMPLANT
POSITIONER SURGICAL ARM (MISCELLANEOUS) ×3 IMPLANT
SAW OSC TIP CART 19.5X105X1.3 (SAW) ×3 IMPLANT
SEALER BIPOLAR AQUA 6.0 (INSTRUMENTS) ×3 IMPLANT
SET HNDPC FAN SPRY TIP SCT (DISPOSABLE) ×1 IMPLANT
SET PAD KNEE POSITIONER (MISCELLANEOUS) ×3 IMPLANT
SPONGE LAP 18X18 X RAY DECT (DISPOSABLE) IMPLANT
SUCTION FRAZIER HANDLE 12FR (TUBING) ×2
SUCTION TUBE FRAZIER 12FR DISP (TUBING) ×1 IMPLANT
SUT MNCRL AB 3-0 PS2 18 (SUTURE) ×3 IMPLANT
SUT MON AB 2-0 CT1 36 (SUTURE) ×6 IMPLANT
SUT STRATAFIX PDO 1 14 VIOLET (SUTURE) ×3
SUT STRATFX PDO 1 14 VIOLET (SUTURE) ×1
SUT VIC AB 1 CT1 36 (SUTURE) ×9 IMPLANT
SUT VIC AB 2-0 CT1 27 (SUTURE) ×3
SUT VIC AB 2-0 CT1 TAPERPNT 27 (SUTURE) ×1 IMPLANT
SUTURE STRATFX PDO 1 14 VIOLET (SUTURE) ×1 IMPLANT
SYR 50ML LL SCALE MARK (SYRINGE) ×3 IMPLANT
TOWER CARTRIDGE SMART MIX (DISPOSABLE) IMPLANT
TRAY FOLEY W/METER SILVER 16FR (SET/KITS/TRAYS/PACK) ×2 IMPLANT
WRAP KNEE MAXI GEL POST OP (GAUZE/BANDAGES/DRESSINGS) ×3 IMPLANT
YANKAUER SUCT BULB TIP 10FT TU (MISCELLANEOUS) ×3 IMPLANT

## 2017-01-08 NOTE — Anesthesia Preprocedure Evaluation (Signed)
Anesthesia Evaluation  Patient identified by MRN, date of birth, ID band Patient awake    Reviewed: Allergy & Precautions, NPO status , Patient's Chart, lab work & pertinent test results  Airway Mallampati: II  TM Distance: >3 FB Neck ROM: Full    Dental no notable dental hx.    Pulmonary neg pulmonary ROS, former smoker,    Pulmonary exam normal breath sounds clear to auscultation       Cardiovascular hypertension, Normal cardiovascular exam Rhythm:Regular Rate:Normal     Neuro/Psych negative neurological ROS  negative psych ROS   GI/Hepatic negative GI ROS, Neg liver ROS,   Endo/Other  Hypothyroidism   Renal/GU negative Renal ROS  negative genitourinary   Musculoskeletal negative musculoskeletal ROS (+)   Abdominal   Peds negative pediatric ROS (+)  Hematology negative hematology ROS (+)   Anesthesia Other Findings   Reproductive/Obstetrics negative OB ROS                             Anesthesia Physical Anesthesia Plan  ASA: II  Anesthesia Plan: Spinal   Post-op Pain Management:    Induction: Intravenous  PONV Risk Score and Plan: 1 and Ondansetron and Treatment may vary due to age  Airway Management Planned:   Additional Equipment:   Intra-op Plan:   Post-operative Plan:   Informed Consent: I have reviewed the patients History and Physical, chart, labs and discussed the procedure including the risks, benefits and alternatives for the proposed anesthesia with the patient or authorized representative who has indicated his/her understanding and acceptance.   Dental advisory given  Plan Discussed with: CRNA and Surgeon  Anesthesia Plan Comments:         Anesthesia Quick Evaluation

## 2017-01-08 NOTE — Progress Notes (Signed)
Portable AP and Lateral Right Knee X-rays done.

## 2017-01-08 NOTE — Anesthesia Postprocedure Evaluation (Signed)
Anesthesia Post Note  Patient: Jerome Stark  Procedure(s) Performed: Procedure(s) (LRB): RIGHT TOTAL KNEE ARTHROPLASTY WITH COMPUTER NAVIGATION (Right)     Patient location during evaluation: PACU Anesthesia Type: Spinal Level of consciousness: oriented and awake and alert Pain management: pain level controlled Vital Signs Assessment: post-procedure vital signs reviewed and stable Respiratory status: spontaneous breathing, respiratory function stable and patient connected to nasal cannula oxygen Cardiovascular status: blood pressure returned to baseline and stable Postop Assessment: no headache and no backache Anesthetic complications: no    Last Vitals:  Vitals:   01/08/17 1630 01/08/17 1645  BP: 121/87 118/76  Pulse: (!) 56 (!) 58  Resp: 14 14  Temp: 36.7 C 36.7 C    Last Pain:  Vitals:   01/08/17 1645  TempSrc: Oral  PainSc:                  [name] S

## 2017-01-08 NOTE — Progress Notes (Signed)
Assisted Dr. Kalman Shan with right, ultrasound guided, adductor canal block. Side rails up, monitors on throughout procedure. See vital signs in flow sheet. Tolerated Procedure well.

## 2017-01-08 NOTE — Anesthesia Procedure Notes (Signed)
Anesthesia Procedure Image    

## 2017-01-08 NOTE — Interval H&P Note (Signed)
History and Physical Interval Note:  01/08/2017 12:19 PM  Jerome Stark  has presented today for surgery, with the diagnosis of Degenerative joint disease, right knee  The various methods of treatment have been discussed with the patient and family. After consideration of risks, benefits and other options for treatment, the patient has consented to  Procedure(s) with comments: RIGHT TOTAL KNEE ARTHROPLASTY WITH COMPUTER NAVIGATION (Right) - Needs RNFA as a surgical intervention .  The patient's history has been reviewed, patient examined, no change in status, stable for surgery.  I have reviewed the patient's chart and labs.  Questions were answered to the patient's satisfaction.     Austina Constantin, Horald Pollen

## 2017-01-08 NOTE — Progress Notes (Signed)
X-ray results noted 

## 2017-01-08 NOTE — Op Note (Signed)
OPERATIVE REPORT  SURGEON: Rod Can, MD   ASSISTANT: Staff.  PREOPERATIVE DIAGNOSIS: Right knee arthritis.   POSTOPERATIVE DIAGNOSIS: Right knee arthritis.   PROCEDURE: Right total knee arthroplasty.   IMPLANTS: Stryker Triathlon CR femur, size 5. Stryker Tritanium tibia, size 5. X3 polyethelyene insert, size 11 mm, CR. 3 button asymmetric patella, size 35 mm.  ANESTHESIA:  Regional and Spinal  TOURNIQUET TIME: Not utilized.   ESTIMATED BLOOD LOSS: 300 mL.  ANTIBIOTICS: 2 g Ancef.  DRAINS: None.  COMPLICATIONS: None   CONDITION: PACU - hemodynamically stable.   BRIEF CLINICAL NOTE: Jerome Stark is a 71 y.o. male with a long-standing history of Right knee arthritis. After failing conservative management, the patient was indicated for total knee arthroplasty. The risks, benefits, and alternatives to the procedure were explained, and the patient elected to proceed.  PROCEDURE IN DETAIL: Adductor canal block was obtained in the pre-op holding area. Once inside the operative room, spinal anesthesia was obtained, and a foley catheter was inserted. The patient was then positioned, a nonsterile tourniquet was placed, and the lower extremity was prepped and draped in the normal sterile surgical fashion. A time-out was called verifying side and site of surgery. The patient received IV antibiotics within 60 minutes of beginning the procedure. The tourniquet was not utilized.  An anterior approach to the knee was performed utilizing a midvastus arthrotomy. A medial release was performed and the patellar fat pad was excised. Stryker navigation was used to cut the distal femur perpendicular to the mechanical axis. A freehand patellar resection was performed, and the patella was sized an prepared with 3 lug holes.  Nagivation was used to make a neutral proximal tibia resection, taking 9  mm of bone from the less affected lateral side with 3 degrees of slope. The menisci were excised. A spacer block was placed, and the alignment and balance in extension were confirmed.   The distal femur was sized using the 3-degree external rotation guide referencing the posterior femoral cortex. The appropriate 4-in-1 cutting block was pinned into place. Rotation was checked using Whiteside's line, the epicondylar axis, and then confirmed with a spacer block in flexion. The remaining femoral cuts were performed, taking care to protect the MCL.  The tibia was sized and the trial tray was pinned into place. The remaining trail components were inserted. The knee was stable to varus and valgus stress through a full range of motion. The patella tracked centrally, and the PCL was well balanced. The trial components were removed, and the proximal tibial surface was prepared. Final components were impacted into place. The knee was tested for a final time and found to be well balanced.  The wound was copiously irrigated with a dilute betadine solution followed by normal saline with pulse lavage. Marcaine solution was injected into the periarticular soft tissue. The wound was closed in layers using #1 Vicryl and Stratafix for the fascia, 2-0 Vicryl for the subcutaneous fat, 2-0 Monocryl for the deep dermal layer, 3-0 running Monocryl subcuticular Stitch, and Dermabond for the skin. Once the glue was fully dried, an Aquacell Ag and compressive dressing were applied. Tthe patient was transported to the recovery room in stable condition. Sponge, needle, and instrument counts were correct at the end of the case x2. The patient tolerated the procedure well and there were no known complications.

## 2017-01-08 NOTE — H&P (View-Only) (Signed)
TOTAL KNEE ADMISSION H&P  Patient is being admitted for right total knee arthroplasty.  Subjective:  Chief Complaint:right knee pain.  HPI: Jerome Stark, 71 y.o. male, has a history of pain and functional disability in the right knee due to arthritis and has failed non-surgical conservative treatments for greater than 12 weeks to includeNSAID's and/or analgesics, corticosteriod injections, viscosupplementation injections, flexibility and strengthening excercises, use of assistive devices, weight reduction as appropriate and activity modification.  Onset of symptoms was gradual, starting 2 years ago with gradually worsening course since that time. The patient noted no past surgery on the right knee(s).  Patient currently rates pain in the right knee(s) at 10 out of 10 with activity. Patient has night pain, worsening of pain with activity and weight bearing, pain that interferes with activities of daily living, pain with passive range of motion, crepitus and joint swelling.  Patient has evidence of subchondral cysts, subchondral sclerosis, periarticular osteophytes and joint space narrowing by imaging studies. There is no active infection.  Patient Active Problem List   Diagnosis Date Noted  . Ulcerative colitis ([place])   . Protein-calorie malnutrition, severe ([place]) 11/28/2014  . Hypothyroidism 11/27/2014  . Benign essential HTN 11/27/2014  . Colitis 11/26/2014   Past Medical History:  Diagnosis Date  . Hyperlipidemia   . Hypertension   . Hypothyroidism   . 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 Ulcerative colitis [facility])     Past Surgical History:  Procedure Laterality Date  . BACK SURGERY    . 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 COLONOSCOPY  March 2015   Dr. Britta Mccreedy: diminutive hyperplastic polyp. scope advanced to the cecum, no intubation of TI, no random biopsies performed  . COLONOSCOPY N/A 11/29/2014   Dr. Gala Romney: left-sided proctocolitis   . LUNG SURGERY       (Not in  a hospital admission) No Known Allergies  Social History  Substance Use Topics  . Smoking status: Former Smoker    Quit date: 02/14/1985  . Smokeless tobacco: Never Used     Comment: smoked 1 ppd for 25 years, quit at age 85  . Alcohol use 0.0 oz/week     Comment: occasional wine    Family History  Problem Relation Age of Onset  . Hypertension Mother   . Heart disease Mother   . Hypertension Other   . Heart attack Brother        coronary artery stent at age 74  . Heart attack Maternal Grandfather        died of MI in late 67s  . Colon cancer Neg Hx      Review of Systems  Constitutional: Negative.   HENT: Negative.   Eyes: Negative.   Respiratory: Negative.   Cardiovascular: Negative.   Gastrointestinal: Negative.   Genitourinary: Negative.   Musculoskeletal: Positive for joint pain.  Skin: Negative.   Neurological: Negative.   Endo/Heme/Allergies: Negative.   Psychiatric/Behavioral: Negative.     Objective:  Physical Exam  Vitals reviewed. Constitutional: He is oriented to person, place, and time. He appears well-developed and well-nourished.  HENT:  Head: Normocephalic and atraumatic.  Eyes: Conjunctivae and EOM are normal. Pupils are equal, round, and reactive to light.  Neck: Normal range of motion. Neck supple.  Cardiovascular: Normal rate, regular rhythm and intact distal pulses.   Respiratory: Effort normal. No respiratory distress.  GI: He exhibits no distension.  Genitourinary:  Genitourinary Comments: deferred  Musculoskeletal:  Right knee: He exhibits decreased range of motion, swelling, effusion and deformity. Tenderness found. Medial joint line and lateral joint line tenderness noted.  Neurological: He is alert and oriented to person, place, and time. He has normal reflexes.  Skin: Skin is warm and dry.  Psychiatric: He has a normal mood and affect. His behavior is normal. Judgment and thought content normal.    Vital signs in last 24  hours: @VSRANGES @  Labs:   Estimated body mass index is 26.5 kg/m as calculated from the following:   Height as of 11/19/16: 5' 11"  (1.803 m).   Weight as of 11/19/16: 86.2 kg (190 lb).   Imaging Review Plain radiographs demonstrate severe degenerative joint disease of the right knee(s). The overall alignment issignificant varus. The bone quality appears to be adequate for age and reported activity level.  Assessment/Plan:  End stage arthritis, right knee   The patient history, physical examination, clinical judgment of the provider and imaging studies are consistent with end stage degenerative joint disease of the right knee(s) and total knee arthroplasty is deemed medically necessary. The treatment options including medical management, injection therapy arthroscopy and arthroplasty were discussed at length. The risks and benefits of total knee arthroplasty were presented and reviewed. The risks due to aseptic loosening, infection, stiffness, patella tracking problems, thromboembolic complications and other imponderables were discussed. The patient acknowledged the explanation, agreed to proceed with the plan and consent was signed. Patient is being admitted for inpatient treatment for surgery, pain control, PT, OT, prophylactic antibiotics, VTE prophylaxis, progressive ambulation and ADL's and discharge planning. The patient is planning to be discharged home with home health services. Already has DME.

## 2017-01-08 NOTE — Transfer of Care (Signed)
Immediate Anesthesia Transfer of Care Note  Patient: Jerome Stark  Procedure(s) Performed: Procedure(s) with comments: RIGHT TOTAL KNEE ARTHROPLASTY WITH COMPUTER NAVIGATION (Right) - Needs RNFA; Adductor block  Patient Location: PACU  Anesthesia Type:Spinal  Level of Consciousness:  sedated, patient cooperative and responds to stimulation  Airway & Oxygen Therapy:Patient Spontanous Breathing and Patient connected to face mask oxgen  Post-op Assessment:  Report given to PACU RN and Post -op Vital signs reviewed and stable  Post vital signs:  Reviewed and stable  Last Vitals:  Vitals:   01/08/17 1215 01/08/17 1216  BP:    Pulse: (!) 58 60  Resp: 16 18  Temp:      Complications: No apparent anesthesia complications

## 2017-01-08 NOTE — Anesthesia Procedure Notes (Signed)
Anesthesia Regional Block: Adductor canal block   Pre-Anesthetic Checklist: ,, timeout performed, Correct Patient, Correct Site, Correct Laterality, Correct Procedure, Correct Position, site marked, Risks and benefits discussed,  Surgical consent,  Pre-op evaluation,  At surgeon's request and post-op pain management  Laterality: Right  Prep: chloraprep       Needles:  Injection technique: Single-shot  Needle Type: Echogenic Needle     Needle Length: 9cm      Additional Needles:   Procedures: ultrasound guided,,,,,,,,  Narrative:  Start time: 01/08/2017 12:10 PM End time: 01/08/2017 12:20 PM Injection made incrementally with aspirations every 5 mL.  Performed by: Personally  Anesthesiologist: Evalyn Shultis  Additional Notes: Patient tolerated the procedure well without complications

## 2017-01-08 NOTE — Discharge Instructions (Signed)
Dr. Rod Can Total Joint Specialist [facility] [address], [place], [place] [address] [phone]  TOTAL KNEE REPLACEMENT POSTOPERATIVE DIRECTIONS    Knee Rehabilitation, Guidelines Following Surgery  Results after knee surgery are often greatly improved when you follow the exercise, range of motion and muscle strengthening exercises prescribed by your doctor. Safety measures are also important to protect the knee from further injury. Any time any of these exercises cause you to have increased pain or swelling in your knee joint, decrease the amount until you are comfortable again and slowly increase them. If you have problems or questions, call your caregiver or physical therapist for advice.   WEIGHT BEARING Weight bearing as tolerated with assist device (walker, cane, etc) as directed, use it as long as suggested by your surgeon or therapist, typically at least 4-6 weeks.  HOME CARE INSTRUCTIONS  Remove items at home which could result in a fall. This includes throw rugs or furniture in walking pathways.  Continue medications as instructed at time of discharge. You may have some home medications which will be placed on hold until you complete the course of blood thinner medication.  You may start showering once you are discharged home but do not submerge the incision under water. Just pat the incision dry and apply a dry gauze dressing on daily. Walk with walker as instructed.  You may resume a sexual relationship in one month or when given the OK by your doctor.   Use walker as long as suggested by your caregivers.  Avoid periods of inactivity such as sitting longer than an hour when not asleep. This helps prevent blood clots.  You may put full weight on your legs and walk as much as is comfortable.  You may return to work once you are cleared by your doctor.  Do not drive a car for 6 weeks or until released by you surgeon.   Do not drive while  taking narcotics.  Wear the elastic stockings for three weeks following surgery during the day but you may remove then at night. Make sure you keep all of your appointments after your operation with all of your doctors and caregivers. You should call the office at the above phone number and make an appointment for approximately two weeks after the date of your surgery. Do not remove your surgical dressing. The dressing is waterproof; you may take showers in 3 days, but do not take tub baths or submerge the dressing. Please pick up a stool softener and laxative for home use as long as you are requiring pain medications.  ICE to the affected knee every three hours for 30 minutes at a time and then as needed for pain and swelling.  Continue to use ice on the knee for pain and swelling from surgery. You may notice swelling that will progress down to the foot and ankle.  This is normal after surgery.  Elevate the leg when you are not up walking on it.   It is important for you to complete the blood thinner medication as prescribed by your doctor.  Continue to use the breathing machine which will help keep your temperature down.  It is common for your temperature to cycle up and down following surgery, especially at night when you are not up moving around and exerting yourself.  The breathing machine keeps your lungs expanded and your temperature down.  RANGE OF MOTION AND STRENGTHENING EXERCISES  Rehabilitation of the knee is important following a  knee injury or an operation. After just a few days of immobilization, the muscles of the thigh which control the knee become weakened and shrink (atrophy). Knee exercises are designed to build up the tone and strength of the thigh muscles and to improve knee motion. Often times heat used for twenty to thirty minutes before working out will loosen up your tissues and help with improving the range of motion but do not use heat for the first two weeks following  surgery. These exercises can be done on a training (exercise) mat, on the floor, on a table or on a bed. Use what ever works the best and is most comfortable for you Knee exercises include:  Leg Lifts - While your knee is still immobilized in a splint or cast, you can do straight leg raises. Lift the leg to 60 degrees, hold for 3 sec, and slowly lower the leg. Repeat 10-20 times 2-3 times daily. Perform this exercise against resistance later as your knee gets better.  Quad and Hamstring Sets - Tighten up the muscle on the front of the thigh (Quad) and hold for 5-10 sec. Repeat this 10-20 times hourly. Hamstring sets are done by pushing the foot backward against an object and holding for 5-10 sec. Repeat as with quad sets.  A rehabilitation program following serious knee injuries can speed recovery and prevent re-injury in the future due to weakened muscles. Contact your doctor or a physical therapist for more information on knee rehabilitation.   SKILLED REHAB INSTRUCTIONS: If the patient is transferred to a skilled rehab facility following release from the hospital, a list of the current medications will be sent to the facility for the patient to continue.  When discharged from the skilled rehab facility, please have the facility set up the patient's [place] prior to being released. Also, the skilled facility will be responsible for providing the patient with their medications at time of release from the facility to include their pain medication, the muscle relaxants, and their blood thinner medication. If the patient is still at the rehab facility at time of the two week follow up appointment, the skilled rehab facility will also need to assist the patient in arranging follow up appointment in our office and any transportation needs.  MAKE SURE YOU:  Understand these instructions.  Will watch your condition.  Will get help right away if you are not doing well or get worse.     Pick up stool softner and laxative for home use following surgery while on pain medications. Do NOT remove your dressing. You may shower.  Do not take tub baths or submerge incision under water. May shower starting three days after surgery. Please use a clean towel to pat the incision dry following showers. Continue to use ice for pain and swelling after surgery. Do not use any lotions or creams on the incision until instructed by your surgeon.

## 2017-01-08 NOTE — Anesthesia Procedure Notes (Signed)
Spinal  Patient location during procedure: OR Start time: 01/08/2017 12:50 PM End time: 01/08/2017 12:50 PM Staffing Anesthesiologist: Geana Walts, Iona Beard Performed: anesthesiologist  Preanesthetic Checklist Completed: patient identified, site marked, surgical consent, pre-op evaluation, timeout performed, IV checked, risks and benefits discussed and monitors and equipment checked Spinal Block Patient position: sitting Prep: Betadine Patient monitoring: heart rate, continuous pulse ox and blood pressure Injection technique: single-shot Needle Needle type: Sprotte  Needle gauge: 24 G Needle length: 9 cm Additional Notes Expiration date of kit checked and confirmed. Patient tolerated procedure well, without complications.

## 2017-01-09 DIAGNOSIS — M1711 Unilateral primary osteoarthritis, right knee: Secondary | ICD-10-CM | POA: Diagnosis not present

## 2017-01-09 LAB — BASIC METABOLIC PANEL
Anion gap: 7 (ref 5–15)
BUN: 12 mg/dL (ref 6–20)
CALCIUM: 8.6 mg/dL — AB (ref 8.9–10.3)
CO2: 23 mmol/L (ref 22–32)
CREATININE: 0.87 mg/dL (ref 0.61–1.24)
Chloride: 107 mmol/L (ref 101–111)
GFR calc Af Amer: >60 mL/min (ref >60)
GFR calc non Af Amer: >60 mL/min (ref >60)
GLUCOSE: 190 mg/dL — AB (ref 65–99)
Potassium: 3.6 mmol/L (ref 3.5–5.1)
Sodium: 137 mmol/L (ref 135–145)

## 2017-01-09 LAB — CBC
HEMATOCRIT: 36.4 % — AB (ref 39.0–52.0)
Hemoglobin: 12.8 g/dL — ABNORMAL LOW (ref 13.0–17.0)
MCH: 31.2 pg (ref 26.0–34.0)
MCHC: 35.2 g/dL (ref 30.0–36.0)
MCV: 88.8 fL (ref 78.0–100.0)
Platelets: 186 10*3/uL (ref 150–400)
RBC: 4.1 MIL/uL — ABNORMAL LOW (ref 4.22–5.81)
RDW: 13.5 % (ref 11.5–15.5)
WBC: 14.6 10*3/uL — ABNORMAL HIGH (ref 4.0–10.5)

## 2017-01-09 MED ORDER — DOCUSATE SODIUM 100 MG PO CAPS: 100.0000 mg | ORAL_CAPSULE | Freq: Two times a day (BID) | ORAL | 1 refills | Status: DC

## 2017-01-09 MED ORDER — HYDROCODONE-ACETAMINOPHEN 5-325 MG PO TABS: 1.0000 | ORAL_TABLET | ORAL | 0 refills | Status: DC | PRN

## 2017-01-09 MED ORDER — ONDANSETRON HCL 4 MG PO TABS: 4.0000 mg | ORAL_TABLET | Freq: Four times a day (QID) | ORAL | 0 refills | Status: DC | PRN

## 2017-01-09 MED ORDER — ASPIRIN 81 MG PO CHEW: 81.0000 mg | CHEWABLE_TABLET | Freq: Two times a day (BID) | ORAL | 1 refills | Status: DC

## 2017-01-09 MED ORDER — SENNA 8.6 MG PO TABS: 2.0000 | ORAL_TABLET | Freq: Every day | ORAL | 0 refills | Status: DC

## 2017-01-09 MED ORDER — METHOCARBAMOL 500 MG PO TABS: 500.0000 mg | ORAL_TABLET | Freq: Four times a day (QID) | ORAL | 0 refills | Status: DC | PRN

## 2017-01-09 NOTE — Discharge Summary (Signed)
Physician Discharge Summary  Patient ID: Jerome Stark MRN: [id number] DOB/AGE: 10-15-1945 71 y.o.  Admit date: 01/08/2017 Discharge date: 01/09/2017  Admission Diagnoses:  Osteoarthritis of right knee  Discharge Diagnoses:  Principal Problem:   Osteoarthritis of right knee Active Problems:   Degenerative arthritis of right knee   Past Medical History:  Diagnosis Date  . Hyperlipidemia   . Hypertension   . Hypothyroidism   . 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 Ulcerative colitis ([place])     Surgeries: Procedure(s): RIGHT TOTAL KNEE ARTHROPLASTY WITH COMPUTER NAVIGATION on 01/08/2017   Consultants (if any):   Discharged Condition: Improved  Hospital Course: Jerome Stark is an 71 y.o. male who was admitted 01/08/2017 with a diagnosis of Osteoarthritis of right knee and went to the operating room on 01/08/2017 and underwent the above named procedures.    He was given perioperative antibiotics:  Anti-infectives    Start     Dose/Rate Route Frequency Ordered Stop   01/08/17 1830  ceFAZolin (ANCEF) IVPB 2g/100 mL premix     2 g 200 mL/hr over 30 Minutes Intravenous Every 6 hours 01/08/17 1658 01/09/17 0100   01/08/17 1055  ceFAZolin (ANCEF) IVPB 2g/100 mL premix     2 g 200 mL/hr over 30 Minutes Intravenous On call to O.R. 01/08/17 1055 01/08/17 1242    .  He was given sequential compression devices, early ambulation, and ASA for DVT prophylaxis.  He benefited maximally from the hospital stay and there were no complications.    Recent vital signs:  Vitals:   01/09/17 0141 01/09/17 0651  BP: 122/62 130/70  Pulse: 78 64  Resp: 16 16  Temp: 98.7 F (37.1 C) 97.9 F (36.6 C)    Recent laboratory studies:  Lab Results  Component Value Date   HGB 12.8 (L) 01/09/2017   HGB 15.1 01/02/2017   HGB 13.1 06/14/2015   Lab Results  Component Value Date   WBC 14.6 (H) 01/09/2017   PLT 186 01/09/2017   No results found for: INR Lab Results  Component Value Date   NA 137  01/09/2017   K 3.6 01/09/2017   CL 107 01/09/2017   CO2 23 01/09/2017   BUN 12 01/09/2017   CREATININE 0.87 01/09/2017   GLUCOSE 190 (H) 01/09/2017    Discharge Medications:   Allergies as of 01/09/2017   No Known Allergies     Medication List    TAKE these medications   amLODipine 10 MG tablet Commonly known as:  NORVASC Take 10 mg by mouth every morning.   aspirin 81 MG chewable tablet Chew 1 tablet (81 mg total) by mouth 2 (two) times daily.   docusate sodium 100 MG capsule Commonly known as:  COLACE Take 1 capsule (100 mg total) by mouth 2 (two) times daily.   FISH OIL PO Take 2 capsules by mouth daily.   hydrochlorothiazide 25 MG tablet Commonly known as:  HYDRODIURIL Take 25 mg by mouth daily.   HYDROcodone-acetaminophen 5-325 MG tablet Commonly known as:  NORCO/VICODIN Take 1-2 tablets by mouth every 4 (four) hours as needed (breakthrough pain).   levothyroxine 75 MCG tablet Commonly known as:  SYNTHROID, LEVOTHROID Take 75 mcg by mouth daily before breakfast.   mesalamine 1.2 g EC tablet Commonly known as:  LIALDA Take 4 tablets (4.8 g total) by mouth daily with breakfast.   methocarbamol 500 MG tablet Commonly known as:  ROBAXIN Take 1 tablet (500 mg total) by mouth every 6 (  six) hours as needed for muscle spasms.   ondansetron 4 MG tablet Commonly known as:  ZOFRAN Take 1 tablet (4 mg total) by mouth every 6 (six) hours as needed for nausea.   QC LORATADINE ALLERGY RELIEF 10 MG tablet Generic drug:  loratadine Take 10 mg by mouth daily as needed for allergies.   senna 8.6 MG Tabs tablet Commonly known as:  SENOKOT Take 2 tablets (17.2 mg total) by mouth at bedtime.   Vitamin D 2000 units tablet Take 2,000 Units by mouth daily.       Diagnostic Studies: Dg Knee Right Port  Result Date: 01/08/2017 CLINICAL DATA:  Postop right knee replacement EXAM: PORTABLE RIGHT KNEE - 1-2 VIEW COMPARISON:  None. FINDINGS: Status post right knee replacement  with intact hardware and normal alignment. Air fluid level in the joint space consistent with recent postsurgical change. Soft tissue gas distal thigh and medial knee as well. No fracture. IMPRESSION: Status post right knee replacement with expected postsurgical changes. Electronically Signed   By: Donavan Foil M.D.   On: 01/08/2017 15:38    Disposition: 01-Home or Self Care  Discharge Instructions    Call MD / Call 911    Complete by:  As directed    If you experience chest pain or shortness of breath, CALL 911 and be transported to the hospital emergency room.  If you develope a fever above 101 F, pus (white drainage) or increased drainage or redness at the wound, or calf pain, call your surgeon's office.   Constipation Prevention    Complete by:  As directed    Drink plenty of fluids.  Prune juice may be helpful.  You may use a stool softener, such as Colace (over the counter) 100 mg twice a day.  Use MiraLax (over the counter) for constipation as needed.   Diet - low sodium heart healthy    Complete by:  As directed    Do not put a pillow under the knee. Place it under the heel.    Complete by:  As directed    Driving restrictions    Complete by:  As directed    No driving for 6 weeks   Increase activity slowly as tolerated    Complete by:  As directed    Lifting restrictions    Complete by:  As directed    No lifting for 6 weeks   TED hose    Complete by:  As directed    Use stockings (TED hose) for 2 weeks on both leg(s).  You may remove them at night for sleeping.      Follow-up Information    Miryam Mcelhinney, Aaron Edelman, MD. Schedule an appointment as soon as possible for a visit in 2 week(s).   Specialty:  Orthopedic Surgery Why:  For wound re-check Contact information: [address] [place] [address] [phone]        [facility] Follow up.   Why:  physical therapy Contact information: [place]  [address] [phone]            Signed: Elie Goody 01/09/2017, 11:20 AM

## 2017-01-09 NOTE — Progress Notes (Signed)
Occupational Therapy Treatment Patient Details Name: Jerome Stark MRN: [id number] DOB: 07-Apr-1946 Today's Date: 01/09/2017    History of present illness 71 yo male s/p R TKA  Past Medical History:  Diagnosis Date  . Hyperlipidemia   . Hypertension   . Hypothyroidism   . 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 Ulcerative colitis ([place])       OT comments  Patient evaluated by Occupational Therapy with no further acute OT needs identified. All education has been completed and the patient has no further questions. See below for any follow-up Occupational Therapy or equipment needs. OT to sign off. Thank you for referral.    Follow Up Recommendations  No OT follow up    Equipment Recommendations  None recommended by OT    Recommendations for Other Services      Precautions / Restrictions Precautions Precautions: Knee Restrictions Weight Bearing Restrictions: No       Mobility Bed Mobility               General bed mobility comments: in hall with PT on arrival  Transfers Overall transfer level: Needs assistance Equipment used: Rolling walker (2 wheeled) Transfers: Sit to/from Stand;Stand Pivot Transfers Sit to Stand: Modified independent (Device/Increase time) Stand pivot transfers: Modified independent (Device/Increase time)            Balance                                           ADL either performed or assessed with clinical judgement   ADL Overall ADL's : Modified independent                                       General ADL Comments: pt educated on bathing, dressing, toileting and shower transfer. pt is able to reach R LE at the toes in seat position in chair. Pt educated on use of ice packs and extension R LE Pt educated on bathing and avoid washing directly on incision. Pt educated to use new wash cloth and towel each day. Pt educated to allow water to run across dressing and not to soak in a tub at this time. Pt advised  RN will instruct on any bandages required otherwise is open to air.       Vision Baseline Vision/History: No visual deficits     Perception     Praxis      Cognition Arousal/Alertness: Awake/alert Behavior During Therapy: WFL for tasks assessed/performed Overall Cognitive Status: Within Functional Limits for tasks assessed                                          Exercises Exercises: General Lower Extremity General Exercises - Lower Extremity Ankle Circles/Pumps: AROM;Right;10 reps;Seated Quad Sets: AROM;Right;10 reps;Seated Straight Leg Raises: AROM;Right;10 reps;Seated   Shoulder Instructions       General Comments dressing dry and intact at this time    Pertinent Vitals/ Pain       Pain Assessment: Faces Faces Pain Scale: Hurts a little bit Pain Location: R knee Pain Descriptors / Indicators: Sore Pain Intervention(s): Monitored during session;Premedicated before session;Repositioned  Home Living Family/patient expects to be discharged to:: Private  residence Living Arrangements: Spouse/significant other Available Help at Discharge: Family;Available 24 hours/day Type of Home: House             Bathroom Shower/Tub: Walk-in Psychologist, prison and probation services: Standard     Home Equipment: Environmental consultant - 2 wheels;Cane - single point;Bedside commode;Crutches          Prior Functioning/Environment Level of Independence: Independent            Frequency           Progress Toward Goals  OT Goals(current goals can now be found in the care plan section)        Plan      Co-evaluation                 AM-PAC PT "6 Clicks" Daily Activity     Outcome Measure   Help from another person eating meals?: None Help from another person taking care of personal grooming?: None Help from another person toileting, which includes using toliet, bedpan, or urinal?: None Help from another person bathing (including washing, rinsing, drying)?:  None Help from another person to put on and taking off regular upper body clothing?: None Help from another person to put on and taking off regular lower body clothing?: None 6 Click Score: 24    End of Session Equipment Utilized During Treatment: Rolling walker  OT Visit Diagnosis: Unsteadiness on feet (R26.81)   Activity Tolerance Patient tolerated treatment well   Patient Left Other (comment)   Nurse Communication Other (comment) (bathroom )        Time: 1308-6578 OT Time Calculation (min): 20 min  Charges: OT General Charges $OT Visit: 1 Procedure OT Evaluation $OT Eval Moderate Complexity: 1 Procedure   Jeri Modena   OTR/L Pager: [phone] Office: [phone] .    Parke Poisson B 01/09/2017, 10:04 AM

## 2017-01-09 NOTE — Addendum Note (Signed)
Addendum  created 01/09/17 1029 by Lollie Sails, CRNA   Charge Capture section accepted

## 2017-01-09 NOTE — Progress Notes (Signed)
RN reviewed discharge instructions with patient and family. All questions answered.   Paperwork and prescriptions given.  NT rolled patient down with all belongings to family car

## 2017-01-09 NOTE — Progress Notes (Signed)
Physical Therapy Treatment Patient Details Name: Jerome Stark MRN: [id number] DOB: 04/18/1946 Today's Date: 01/09/2017    History of Present Illness 71 yo male s/p R TKA    PT Comments    Pt very motivated and progressing well with mobility.  Reviewed therex and stairs with written instruction provided.   Follow Up Recommendations  Home health PT     Equipment Recommendations  Rolling walker with 5" wheels    Recommendations for Other Services OT consult     Precautions / Restrictions Precautions Precautions: Knee Restrictions Weight Bearing Restrictions: No Other Position/Activity Restrictions: WBAT    Mobility  Bed Mobility Overal bed mobility: Needs Assistance Bed Mobility: Sit to Supine;Supine to Sit     Supine to sit: Supervision Sit to supine: Supervision   General bed mobility comments: min cues for use of L LE to self assist  Transfers Overall transfer level: Needs assistance Equipment used: Rolling walker (2 wheeled) Transfers: Sit to/from Stand Sit to Stand: Supervision         General transfer comment: cues for LE management and use of UEs to self assist  Ambulation/Gait Ambulation/Gait assistance: Min guard;Supervision Ambulation Distance (Feet): 150 Feet Assistive device: Rolling walker (2 wheeled) Gait Pattern/deviations: Step-to pattern;Step-through pattern;Decreased step length - right;Decreased step length - left;Shuffle;Trunk flexed Gait velocity: decr Gait velocity interpretation: Below normal speed for age/gender General Gait Details: min cues for sequence, posture and position from RW   Stairs Stairs: Yes   Stair Management: No rails;Step to pattern;Forwards;With walker      Wheelchair Mobility    Modified Rankin (Stroke Patients Only)       Balance                                            Cognition Arousal/Alertness: Awake/alert Behavior During Therapy: WFL for tasks assessed/performed Overall  Cognitive Status: Within Functional Limits for tasks assessed                                        Exercises Total Joint Exercises Ankle Circles/Pumps: AROM;Both;20 reps;Supine Quad Sets: AROM;Both;10 reps;Supine Heel Slides: AAROM;Right;15 reps;Supine Straight Leg Raises: AAROM;AROM;Right;15 reps;Supine    General Comments        Pertinent Vitals/Pain Pain Assessment: 0-10 Pain Score: 3  Pain Location: R knee/thigh Pain Descriptors / Indicators: Sore Pain Intervention(s): Limited activity within patient's tolerance;Monitored during session;Premedicated before session;Ice applied    Home Living Family/patient expects to be discharged to:: Private residence Living Arrangements: Spouse/significant other Available Help at Discharge: Family;Available 24 hours/day Type of Home: House Home Access: Stairs to enter   Home Layout: One level Home Equipment: Environmental consultant - 2 wheels;Cane - single point;Bedside commode;Crutches      Prior Function Level of Independence: Independent          PT Goals (current goals can now be found in the care plan section) Acute Rehab PT Goals 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 : None Difficulty sitting down on and standing up from a chair with arms (e.g., wheelchair, bedside commode, etc,.)?: A Little Help needed moving to and from a bed to chair (including a wheelchair)?: A Little Help needed walking in hospital room?: A Little Help needed climbing 3-5 steps with a railing? : A  Little 6 Click Score: 20    End of Session Equipment Utilized During Treatment: Gait belt Activity Tolerance: Patient tolerated treatment well Patient left: in bed;with call bell/phone within reach Nurse Communication: Mobility status PT Visit Diagnosis: Difficulty in walking, not elsewhere classified (R26.2)     Time: 6270-3500 PT Time Calculation (min) (ACUTE ONLY): 28 min  Charges:  $Therapeutic Exercise: 8-22 mins                    G Codes:       Pg [phone]    Chang Tiggs 01/09/2017, 2:52 PM

## 2017-01-09 NOTE — Progress Notes (Signed)
   Subjective:  Patient reports pain as mild to moderate.  Denies N/V/CP/SOB.  Objective:   VITALS:   Vitals:   01/08/17 1945 01/08/17 2254 01/09/17 0141 01/09/17 0651  BP: 127/68 120/71 122/62 130/70  Pulse: 86 86 78 64  Resp: 16 16 16 16   Temp: 97.9 F (36.6 C) 97.8 F (36.6 C) 98.7 F (37.1 C) 97.9 F (36.6 C)  TempSrc: Oral Oral Oral Oral  SpO2: 100% 100% 100% 100%  Weight:      Height:        NAD ABD soft Sensation intact distally Dorsiflexion/Plantar flexion intact Incision: dressing C/D/I Compartment soft   Lab Results  Component Value Date   WBC 14.6 (H) 01/09/2017   HGB 12.8 (L) 01/09/2017   HCT 36.4 (L) 01/09/2017   MCV 88.8 01/09/2017   PLT 186 01/09/2017   BMET    Component Value Date/Time   NA 137 01/09/2017 0511   K 3.6 01/09/2017 0511   CL 107 01/09/2017 0511   CO2 23 01/09/2017 0511   GLUCOSE 190 (H) 01/09/2017 0511   BUN 12 01/09/2017 0511   CREATININE 0.87 01/09/2017 0511   CREATININE 0.97 06/14/2015 0946   CALCIUM 8.6 (L) 01/09/2017 0511   GFRNONAA >60 01/09/2017 0511   GFRAA >60 01/09/2017 0511     Assessment/Plan: 1 Day Post-Op   Principal Problem:   Osteoarthritis of right knee Active Problems:   Degenerative arthritis of right knee   WBAT with walker PT/OT DVT ppx: ASA, SCDs, TEDs PO pain control PT/OT Dispo: D/C home with HHPT   Jerome Stark, Jerome Stark 01/09/2017, 11:11 AM   Rod Can, MD Cell [phone]

## 2017-01-09 NOTE — Progress Notes (Signed)
Discharge planning, spoke with patient and spouse at beside. [facility] for [facility] services, contacted [facility] for referral. Has RW and 3-n-1. [phone]

## 2017-01-09 NOTE — Evaluation (Signed)
Physical Therapy Evaluation Patient Details Name: Jerome Stark MRN: [id number] DOB: Dec 31, 1945 Today's Date: 01/09/2017   History of Present Illness  71 yo male s/p R TKA  Clinical Impression  Pt s/p R TKR and presents with decreased R LE strength/ROM and post op pain limiting functional mobility.  Pt should progress to dc home with family assist.    Follow Up Recommendations Home health PT    Equipment Recommendations       Recommendations for Other Services OT consult     Precautions / Restrictions Precautions Precautions: Knee Restrictions Weight Bearing Restrictions: No Other Position/Activity Restrictions: WBAT      Mobility  Bed Mobility Overal bed mobility: Needs Assistance Bed Mobility: Supine to Sit     Supine to sit: Min guard     General bed mobility comments: min cues for use of L LE to self assist  Transfers Overall transfer level: Needs assistance Equipment used: Rolling walker (2 wheeled) Transfers: Sit to/from Stand Sit to Stand: Min guard         General transfer comment: cues for LE management and use of UEs to self assist  Ambulation/Gait Ambulation/Gait assistance: Min assist;Min guard Ambulation Distance (Feet): 123 Feet Assistive device: Rolling walker (2 wheeled) Gait Pattern/deviations: Step-to pattern;Shuffle;Trunk flexed;Decreased step length - right;Decreased step length - left Gait velocity: decr Gait velocity interpretation: Below normal speed for age/gender General Gait Details: cues for sequence, posture and position from [employer]            Wheelchair Mobility    Modified Rankin (Stroke Patients Only)       Balance                                             Pertinent Vitals/Pain Pain Assessment: 0-10 Pain Score: 2  Pain Location: R knee Pain Descriptors / Indicators: Sore Pain Intervention(s): Limited activity within patient's tolerance;Ice applied;Monitored during  session;Premedicated before session    Home Living Family/patient expects to be discharged to:: Private residence Living Arrangements: Spouse/significant other Available Help at Discharge: Family;Available 24 hours/day Type of Home: House Home Access: Stairs to enter   [employer] of Steps: 1 Home Layout: One level Home Equipment: Walker - 2 wheels;Cane - single point;Bedside commode;Crutches      Prior Function Level of Independence: Independent               Hand Dominance   Dominant Hand: Right    Extremity/Trunk Assessment   Upper Extremity Assessment Upper Extremity Assessment: Overall WFL for tasks assessed    Lower Extremity Assessment Lower Extremity Assessment: RLE deficits/detail RLE Deficits / Details: 3/5 quads with IND SLR and AAROM at knee -10 - 110    Cervical / Trunk Assessment Cervical / Trunk Assessment: Normal  Communication   Communication: No difficulties  Cognition Arousal/Alertness: Awake/alert Behavior During Therapy: WFL for tasks assessed/performed Overall Cognitive Status: Within Functional Limits for tasks assessed                                        General Comments      Exercises Total Joint Exercises Ankle Circles/Pumps: AROM;Both;20 reps;Supine Quad Sets: AROM;Both;10 reps;Supine Heel Slides: AAROM;Right;15 reps;Supine Straight Leg Raises: AAROM;AROM;Right;15 reps;Supine   Assessment/Plan    PT Assessment Patient  needs continued PT services  PT Problem List Decreased strength;Decreased range of motion;Decreased activity tolerance;Decreased mobility;Decreased knowledge of use of DME;Pain       PT Treatment Interventions DME instruction;Gait training;Stair training;Functional mobility training;Therapeutic activities;Therapeutic exercise;Patient/family education    PT Goals (Current goals can be found in the Care Plan section)  Acute Rehab PT Goals 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 : A Little Difficulty sitting down on and standing up from a chair with arms (e.g., wheelchair, bedside commode, etc,.)?: A Little Help needed moving to and from a bed to chair (including a wheelchair)?: A Little Help needed walking in hospital room?: A Little Help needed climbing 3-5 steps with a railing? : A Little 6 Click Score: 18    End of Session Equipment Utilized During Treatment: Gait belt Activity Tolerance: Patient tolerated treatment well Patient left: Other (comment) Nurse Communication: Mobility status PT Visit Diagnosis: Difficulty in walking, not elsewhere classified (R26.2)    Time: 3403-5248 PT Time Calculation (min) (ACUTE ONLY): 28 min   Charges:   PT Evaluation $PT Eval Low Complexity: 1 Procedure PT Treatments $Therapeutic Exercise: 8-22 mins   PT G Codes:        Pg [phone]   Jerome Stark 01/09/2017, 1:02 PM

## 2017-01-12 NOTE — Progress Notes (Signed)
OT NOTE- LATE GCODE ENTRY     01-20-17 1400  OT G-codes **NOT FOR INPATIENT CLASS**  Functional Assessment Tool Used Clinical judgement  Functional Limitation Self care  Self Care Current Status (O2518) CI  Self Care Discharge Status [phone]) CI         Jeri Modena   OTR/L Pager: [phone] Office: [phone] .

## 2017-01-22 NOTE — Progress Notes (Signed)
   01/09/17 1200  PT Time Calculation  PT Start Time (ACUTE ONLY) 0900  PT Stop Time (ACUTE ONLY) 0928  PT Time Calculation (min) (ACUTE ONLY) 28 min  PT G-Codes **NOT FOR INPATIENT CLASS**  Functional Assessment Tool Used Clinical judgement  Functional Limitation Mobility: Walking and moving around  Mobility: Walking and Moving Around Current Status (Y1117) CJ  Mobility: Walking and Moving Around Goal Status (B5670) CI  PT General Charges  $$ ACUTE PT VISIT 1 Procedure  PT Evaluation  $PT Eval Low Complexity 1 Procedure  PT Treatments  $Therapeutic Exercise 8-22 mins     01/09/17 1200  PT Time Calculation  PT Start Time (ACUTE ONLY) 0900  PT Stop Time (ACUTE ONLY) 0928  PT Time Calculation (min) (ACUTE ONLY) 28 min  PT G-Codes **NOT FOR INPATIENT CLASS**  Functional Assessment Tool Used Clinical judgement  Functional Limitation Mobility: Walking and moving around  Mobility: Walking and Moving Around Current Status (L4103) CJ  Mobility: Walking and Moving Around Goal Status (U1314) CI  PT General Charges  $$ ACUTE PT VISIT 1 Procedure  PT Evaluation  $PT Eval Low Complexity 1 Procedure  PT Treatments  $Therapeutic Exercise 8-22 mins

## 2017-01-29 ENCOUNTER — Telehealth: Payer: Self-pay | Admitting: Physician Assistant

## 2017-01-29 ENCOUNTER — Ambulatory Visit: Payer: Self-pay | Admitting: Orthopedic Surgery

## 2017-01-29 NOTE — Telephone Encounter (Signed)
I sae Jerome Stark 2 month for preoperative clearance, case was discussed with Dr. Gwenlyn Found at the time who agrees no further workup is needed. He does not have prior h/o CAD. He underwent R total knee arthroplasty on 01/08/2017, post operative course was uncomplicated.   Received request for preop clearance again today, I have called the patient myself, there has been no change compare to when I saw him 2 month ago, he denies any chest pain or SOB. He is cleared from cardiac perspective. He is low risk patient.   Jerome Corrigan PA Pager: [phone]

## 2017-02-05 ENCOUNTER — Ambulatory Visit: Payer: Self-pay | Admitting: Orthopedic Surgery

## 2017-02-05 NOTE — H&P (Signed)
TOTAL KNEE ADMISSION H&P  Patient is being admitted for left total knee arthroplasty.  Subjective:  Chief Complaint:left knee pain.  HPI: Jerome Stark, 71 y.o. male, has a history of pain and functional disability in the left knee due to arthritis and has failed non-surgical conservative treatments for greater than 12 weeks to includeNSAID's and/or analgesics, corticosteriod injections, flexibility and strengthening excercises, supervised PT with diminished ADL's post treatment, use of assistive devices, weight reduction as appropriate and activity modification.  Onset of symptoms was gradual, starting >10 years ago with gradually worsening course since that time. The patient noted no past surgery on the left knee(s).  Patient currently rates pain in the left knee(s) at 10 out of 10 with activity. Patient has night pain, worsening of pain with activity and weight bearing, pain that interferes with activities of daily living, pain with passive range of motion, crepitus and joint swelling.  Patient has evidence of subchondral cysts, subchondral sclerosis, periarticular osteophytes and joint space narrowing by imaging studies. There is no active infection.  Patient Active Problem List   Diagnosis Date Noted  . Osteoarthritis of right knee 01/08/2017  . Degenerative arthritis of right knee 01/08/2017  . Ulcerative colitis ([place])   . Protein-calorie malnutrition, severe ([place]) 11/28/2014  . Hypothyroidism 11/27/2014  . Benign essential HTN 11/27/2014  . Colitis 11/26/2014   Past Medical History:  Diagnosis Date  . Hyperlipidemia   . Hypertension   . Hypothyroidism   . 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 Ulcerative colitis [facility])     Past Surgical History:  Procedure Laterality Date  . BACK SURGERY    . 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 COLONOSCOPY  March 2015   Dr. Britta Mccreedy: diminutive hyperplastic polyp. scope advanced to the cecum, no intubation of TI, no random biopsies  performed  . COLONOSCOPY N/A 11/29/2014   Dr. Gala Romney: left-sided proctocolitis   . KNEE ARTHROPLASTY Right 01/08/2017   Procedure: RIGHT TOTAL KNEE ARTHROPLASTY WITH COMPUTER NAVIGATION;  Surgeon: Rod Can, MD;  Location: [facility] ORS;  Service: Orthopedics;  Laterality: Right;  Needs RNFA; Adductor block  . LUNG SURGERY       (Not in a hospital admission) No Known Allergies  Social History  Substance Use Topics  . Smoking status: Former Smoker    Quit date: 02/14/1985  . Smokeless tobacco: Never Used     Comment: smoked 1 ppd for 25 years, quit at age 26  . Alcohol use 0.0 oz/week     Comment: occasional wine    Family History  Problem Relation Age of Onset  . Hypertension Mother   . Heart disease Mother   . Hypertension Other   . Heart attack Brother        coronary artery stent at age 20  . Heart attack Maternal Grandfather        died of MI in late 79s  . Colon cancer Neg Hx      Review of Systems  Constitutional: Negative.   HENT: Negative.   Eyes: Negative.   Respiratory: Negative.   Cardiovascular: Negative.   Gastrointestinal: Negative.   Genitourinary: Negative.   Musculoskeletal: Positive for joint pain.  Skin: Negative.   Neurological: Negative.   Endo/Heme/Allergies: Negative.   Psychiatric/Behavioral: Negative.     Objective:  Physical Exam  Vitals reviewed. Constitutional: He is oriented to person, place, and time. He appears well-developed and well-nourished.  HENT:  Head: Normocephalic and atraumatic.  Eyes: Conjunctivae  and EOM are normal. Pupils are equal, round, and reactive to light.  Neck: Normal range of motion. Neck supple.  Cardiovascular: Normal rate, regular rhythm and intact distal pulses.   Respiratory: Effort normal. No respiratory distress.  GI: Soft. He exhibits no distension.  Genitourinary:  Genitourinary Comments: deferred  Musculoskeletal:       Left knee: He exhibits decreased range of motion, swelling and effusion.  Tenderness found. Medial joint line and lateral joint line tenderness noted.  Neurological: He is alert and oriented to person, place, and time. He has normal reflexes.  Skin: Skin is warm and dry.  Psychiatric: He has a normal mood and affect. His behavior is normal. Judgment and thought content normal.    Vital signs in last 24 hours: @VSRANGES @  Labs:   Estimated body mass index is 26.36 kg/m as calculated from the following:   Height as of 01/08/17: 5' 11"  (1.803 m).   Weight as of 01/08/17: 85.7 kg (189 lb).   Imaging Review Plain radiographs demonstrate severe degenerative joint disease of the left knee(s). The overall alignment issignificant varus. The bone quality appears to be adequate for age and reported activity level.  Assessment/Plan:  End stage arthritis, left knee   The patient history, physical examination, clinical judgment of the provider and imaging studies are consistent with end stage degenerative joint disease of the left knee(s) and total knee arthroplasty is deemed medically necessary. The treatment options including medical management, injection therapy arthroscopy and arthroplasty were discussed at length. The risks and benefits of total knee arthroplasty were presented and reviewed. The risks due to aseptic loosening, infection, stiffness, patella tracking problems, thromboembolic complications and other imponderables were discussed. The patient acknowledged the explanation, agreed to proceed with the plan and consent was signed. Patient is being admitted for inpatient treatment for surgery, pain control, PT, OT, prophylactic antibiotics, VTE prophylaxis, progressive ambulation and ADL's and discharge planning. The patient is planning to be discharged outpatient PT

## 2017-02-18 ENCOUNTER — Other Ambulatory Visit (HOSPITAL_COMMUNITY): Payer: Self-pay | Admitting: Emergency Medicine

## 2017-02-18 NOTE — Progress Notes (Signed)
Cardiology clearance telephone note in epic and on chart P.[facility]. [place] [facility] 11-19-16 epic   EKG 11-19-16 epic

## 2017-02-18 NOTE — Patient Instructions (Addendum)
Jerome Stark  02/18/2017   Your procedure is scheduled on: 02-26-17  Report to [facility] Main  Entrance Take [place]  elevators to 3rd floor to  [place] at 1:45PM.    Call this number if you have problems the morning of surgery [phone]    Remember: ONLY 1 PERSON MAY GO WITH YOU TO SHORT STAY TO GET  READY MORNING OF YOUR SURGERY.    Do not eat food After Midnight. You may have clear liquids from midnight until 945am day of surgery. Nothing by mouth after 945am!     Take these medicines the morning of surgery with A SIP OF WATER: amlodipine(norvasc), synthroid, loratadine as needed, hydrocodone as needed                                You may not have any metal on your body including hair pins and              piercings  Do not wear jewelry, make-up, lotions, powders or perfumes, deodorant              Men may shave face and neck.   Do not bring valuables to the hospital. [place].  Contacts, dentures or bridgework may not be worn into surgery.  Leave suitcase in the car. After surgery it may be brought to your room.               Please read over the following fact sheets you were given: _____________________________________________________________________    CLEAR LIQUID DIET   Foods Allowed                                                                     Foods Excluded  Coffee and tea, regular and decaf                             liquids that you cannot  Plain Jell-O in any flavor                                             see through such as: Fruit ices (not with fruit pulp)                                     milk, soups, orange juice  Iced Popsicles                                    All solid food Carbonated beverages, regular and diet  Cranberry, grape and apple juices Sports drinks like Gatorade Lightly seasoned clear broth or consume(fat  free) Sugar, honey syrup  Sample Menu Breakfast                                Lunch                                     Supper Cranberry juice                    Beef broth                            Chicken broth Jell-O                                     Grape juice                           Apple juice Coffee or tea                        Jell-O                                      Popsicle                                                Coffee or tea                        Coffee or tea  _____________________________________________________________________  [facility] - Preparing for Surgery Before surgery, you can play an important role.  Because skin is not sterile, your skin needs to be as free of germs as possible.  You can reduce the number of germs on your skin by washing with CHG (chlorahexidine gluconate) soap before surgery.  CHG is an antiseptic cleaner which kills germs and bonds with the skin to continue killing germs even after washing. Please DO NOT use if you have an allergy to CHG or antibacterial soaps.  If your skin becomes reddened/irritated stop using the CHG and inform your nurse when you arrive at Short Stay. Do not shave (including legs and underarms) for at least 48 hours prior to the first CHG shower.  You may shave your face/neck. Please follow these instructions carefully:  1.  Shower with CHG Soap the night before surgery and the  morning of Surgery.  2.  If you choose to wash your hair, wash your hair first as usual with your  normal  shampoo.  3.  After you shampoo, rinse your hair and body thoroughly to remove the  shampoo.                           4.  Use CHG as you would any other liquid soap.  You can apply chg directly  to the skin and wash  Gently with a scrungie or clean washcloth.  5.  Apply the CHG Soap to your body ONLY FROM THE NECK DOWN.   Do not use on face/ open                           Wound or open sores. Avoid contact  with eyes, ears mouth and genitals (private parts).                       Wash face,  Genitals (private parts) with your normal soap.             6.  Wash thoroughly, paying special attention to the area where your surgery  will be performed.  7.  Thoroughly rinse your body with warm water from the neck down.  8.  DO NOT shower/wash with your normal soap after using and rinsing off  the CHG Soap.                9.  Pat yourself dry with a clean towel.            10.  Wear clean pajamas.            11.  Place clean sheets on your bed the night of your first shower and do not  sleep with pets. Day of Surgery : Do not apply any lotions/deodorants the morning of surgery.  Please wear clean clothes to the hospital/surgery center.  FAILURE TO FOLLOW THESE INSTRUCTIONS MAY RESULT IN THE CANCELLATION OF YOUR SURGERY PATIENT SIGNATURE_________________________________  NURSE SIGNATURE__________________________________  ________________________________________________________________________   Adam Phenix  An incentive spirometer is a tool that can help keep your lungs clear and active. This tool measures how well you are filling your lungs with each breath. Taking long deep breaths may help reverse or decrease the chance of developing breathing (pulmonary) problems (especially infection) following:  A long period of time when you are unable to move or be active. BEFORE THE PROCEDURE   If the spirometer includes an indicator to show your best effort, your nurse or respiratory therapist will set it to a desired goal.  If possible, sit up straight or lean slightly forward. Try not to slouch.  Hold the incentive spirometer in an upright position. INSTRUCTIONS FOR USE  1. Sit on the edge of your bed if possible, or sit up as far as you can in bed or on a chair. 2. Hold the incentive spirometer in an upright position. 3. Breathe out normally. 4. Place the mouthpiece in your mouth and seal  your lips tightly around it. 5. Breathe in slowly and as deeply as possible, raising the piston or the ball toward the top of the column. 6. Hold your breath for 3-5 seconds or for as long as possible. Allow the piston or ball to fall to the bottom of the column. 7. Remove the mouthpiece from your mouth and breathe out normally. 8. Rest for a few seconds and repeat Steps 1 through 7 at least 10 times every 1-2 hours when you are awake. Take your time and take a few normal breaths between deep breaths. 9. The spirometer may include an indicator to show your best effort. Use the indicator as a goal to work toward during each repetition. 10. After each set of 10 deep breaths, practice coughing to be sure your lungs are clear. If you have an incision (the cut made at the time of  surgery), support your incision when coughing by placing a pillow or rolled up towels firmly against it. Once you are able to get out of bed, walk around indoors and cough well. You may stop using the incentive spirometer when instructed by your caregiver.  RISKS AND COMPLICATIONS  Take your time so you do not get dizzy or light-headed.  If you are in pain, you may need to take or ask for pain medication before doing incentive spirometry. It is harder to take a deep breath if you are having pain. AFTER USE  Rest and breathe slowly and easily.  It can be helpful to keep track of a log of your progress. Your caregiver can provide you with a simple table to help with this. If you are using the spirometer at home, follow these instructions: [place] IF:   You are having difficultly using the spirometer.  You have trouble using the spirometer as often as instructed.  Your pain medication is not giving enough relief while using the spirometer.  You develop fever of 100.5 F (38.1 C) or higher. SEEK IMMEDIATE MEDICAL CARE IF:   You cough up bloody sputum that had not been present before.  You develop fever of  102 F (38.9 C) or greater.  You develop worsening pain at or near the incision site. MAKE SURE YOU:   Understand these instructions.  Will watch your condition.  Will get help right away if you are not doing well or get worse. Document Released: 12/01/2006 Document Revised: 10/13/2011 Document Reviewed: 02/01/2007 Exit[employer] Patient Information 2014 ExitCare, [place].   ________________________________________________________________________  WHAT IS A BLOOD TRANSFUSION? Blood Transfusion Information  A transfusion is the replacement of blood or some of its parts. Blood is made up of multiple cells which provide different functions.  Red blood cells carry oxygen and are used for blood loss replacement.  White blood cells fight against infection.  Platelets control bleeding.  Plasma helps clot blood.  Other blood products are available for specialized needs, such as hemophilia or other clotting disorders. BEFORE THE TRANSFUSION  Who gives blood for transfusions?   Healthy volunteers who are fully evaluated to make sure their blood is safe. This is blood bank blood. Transfusion therapy is the safest it has ever been in the practice of medicine. Before blood is taken from a donor, a complete history is taken to make sure that person has no history of diseases nor engages in risky social behavior (examples are intravenous drug use or sexual activity with multiple partners). The donor's travel history is screened to minimize risk of transmitting infections, such as malaria. The donated blood is tested for signs of infectious diseases, such as HIV and hepatitis. The blood is then tested to be sure it is compatible with you in order to minimize the chance of a transfusion reaction. If you or a relative donates blood, this is often done in anticipation of surgery and is not appropriate for emergency situations. It takes many days to process the donated blood. RISKS AND COMPLICATIONS Although  transfusion therapy is very safe and saves many lives, the main dangers of transfusion include:   Getting an infectious disease.  Developing a transfusion reaction. This is an allergic reaction to something in the blood you were given. Every precaution is taken to prevent this. The decision to have a blood transfusion has been considered carefully by your caregiver before blood is given. Blood is not given unless the benefits outweigh the risks. AFTER THE  TRANSFUSION  Right after receiving a blood transfusion, you will usually feel much better and more energetic. This is especially true if your red blood cells have gotten low (anemic). The transfusion raises the level of the red blood cells which carry oxygen, and this usually causes an energy increase.  The nurse administering the transfusion will monitor you carefully for complications. HOME CARE INSTRUCTIONS  No special instructions are needed after a transfusion. You may find your energy is better. Speak with your caregiver about any limitations on activity for underlying diseases you may have. SEEK MEDICAL CARE IF:   Your condition is not improving after your transfusion.  You develop redness or irritation at the intravenous (IV) site. SEEK IMMEDIATE MEDICAL CARE IF:  Any of the following symptoms occur over the next 12 hours:  Shaking chills.  You have a temperature by mouth above 102 F (38.9 C), not controlled by medicine.  Chest, back, or muscle pain.  People around you feel you are not acting correctly or are confused.  Shortness of breath or difficulty breathing.  Dizziness and fainting.  You get a rash or develop hives.  You have a decrease in urine output.  Your urine turns a dark color or changes to pink, red, or brown. Any of the following symptoms occur over the next 10 days:  You have a temperature by mouth above 102 F (38.9 C), not controlled by medicine.  Shortness of breath.  Weakness after normal  activity.  The white part of the eye turns yellow (jaundice).  You have a decrease in the amount of urine or are urinating less often.  Your urine turns a dark color or changes to pink, red, or brown. Document Released: 07/18/2000 Document Revised: 10/13/2011 Document Reviewed: 03/06/2008 [facility] Patient Information 2014 [place], [place].  _______________________________________________________________________

## 2017-02-20 ENCOUNTER — Encounter (HOSPITAL_COMMUNITY)
Admission: RE | Admit: 2017-02-20 | Discharge: 2017-02-20 | Disposition: A | Discharge status: Home / Self Care | Payer: Non-veteran care | Source: Ambulatory Visit | Attending: Orthopedic Surgery | Admitting: Orthopedic Surgery

## 2017-02-20 ENCOUNTER — Encounter (HOSPITAL_COMMUNITY): Payer: Self-pay

## 2017-02-20 ENCOUNTER — Encounter (INDEPENDENT_AMBULATORY_CARE_PROVIDER_SITE_OTHER): Payer: Self-pay

## 2017-02-20 DIAGNOSIS — Z01818 Encounter for other preprocedural examination: Secondary | ICD-10-CM | POA: Insufficient documentation

## 2017-02-20 DIAGNOSIS — M1712 Unilateral primary osteoarthritis, left knee: Secondary | ICD-10-CM | POA: Diagnosis not present

## 2017-02-20 LAB — BASIC METABOLIC PANEL
Anion gap: 10 (ref 5–15)
BUN: 14 mg/dL (ref 6–20)
CALCIUM: 9.2 mg/dL (ref 8.9–10.3)
CHLORIDE: 103 mmol/L (ref 101–111)
CO2: 26 mmol/L (ref 22–32)
CREATININE: 0.91 mg/dL (ref 0.61–1.24)
GFR calc non Af Amer: >60 mL/min (ref >60)
Glucose, Bld: 103 mg/dL — ABNORMAL HIGH (ref 65–99)
Potassium: 4.2 mmol/L (ref 3.5–5.1)
SODIUM: 139 mmol/L (ref 135–145)

## 2017-02-20 LAB — CBC
HCT: 42 % (ref 39.0–52.0)
HEMOGLOBIN: 14.5 g/dL (ref 13.0–17.0)
MCH: 30.6 pg (ref 26.0–34.0)
MCHC: 34.5 g/dL (ref 30.0–36.0)
MCV: 88.6 fL (ref 78.0–100.0)
Platelets: 225 10*3/uL (ref 150–400)
RBC: 4.74 MIL/uL (ref 4.22–5.81)
RDW: 14 % (ref 11.5–15.5)
WBC: 6.4 10*3/uL (ref 4.0–10.5)

## 2017-02-20 LAB — SURGICAL PCR SCREEN
MRSA, PCR: NEGATIVE
STAPHYLOCOCCUS AUREUS: NEGATIVE

## 2017-02-26 ENCOUNTER — Inpatient Hospital Stay (HOSPITAL_COMMUNITY): Payer: Non-veteran care | Admitting: Certified Registered Nurse Anesthetist

## 2017-02-26 ENCOUNTER — Encounter (HOSPITAL_COMMUNITY): Payer: Self-pay | Admitting: *Deleted

## 2017-02-26 ENCOUNTER — Observation Stay (HOSPITAL_COMMUNITY)
Admission: RE | Admit: 2017-02-26 | Discharge: 2017-02-27 | Disposition: A | Discharge status: Home / Self Care | Payer: Non-veteran care | Source: Ambulatory Visit | Attending: Orthopedic Surgery | Admitting: Orthopedic Surgery

## 2017-02-26 ENCOUNTER — Encounter (HOSPITAL_COMMUNITY)
Admission: RE | Disposition: A | Discharge status: Home / Self Care | Payer: Self-pay | Source: Ambulatory Visit | Attending: Orthopedic Surgery

## 2017-02-26 ENCOUNTER — Observation Stay (HOSPITAL_COMMUNITY): Payer: Non-veteran care

## 2017-02-26 DIAGNOSIS — E785 Hyperlipidemia, unspecified: Secondary | ICD-10-CM | POA: Diagnosis not present

## 2017-02-26 DIAGNOSIS — I10 Essential (primary) hypertension: Secondary | ICD-10-CM | POA: Diagnosis not present

## 2017-02-26 DIAGNOSIS — Z96652 Presence of left artificial knee joint: Secondary | ICD-10-CM

## 2017-02-26 DIAGNOSIS — Z87891 Personal history of nicotine dependence: Secondary | ICD-10-CM | POA: Diagnosis not present

## 2017-02-26 DIAGNOSIS — Z7982 Long term (current) use of aspirin: Secondary | ICD-10-CM | POA: Diagnosis not present

## 2017-02-26 DIAGNOSIS — E039 Hypothyroidism, unspecified: Secondary | ICD-10-CM | POA: Insufficient documentation

## 2017-02-26 DIAGNOSIS — M1712 Unilateral primary osteoarthritis, left knee: Secondary | ICD-10-CM | POA: Diagnosis present

## 2017-02-26 DIAGNOSIS — Z79899 Other long term (current) drug therapy: Secondary | ICD-10-CM | POA: Insufficient documentation

## 2017-02-26 HISTORY — PX: KNEE ARTHROPLASTY: SHX992

## 2017-02-26 LAB — TYPE AND SCREEN
ABO/RH(D): B POS
ANTIBODY SCREEN: NEGATIVE

## 2017-02-26 SURGERY — ARTHROPLASTY, KNEE, TOTAL, USING IMAGELESS COMPUTER-ASSISTED NAVIGATION
Anesthesia: Spinal | Site: Knee | Laterality: Left

## 2017-02-26 MED ORDER — MENTHOL 3 MG MT LOZG: 1.0000 | LOZENGE | OROMUCOSAL | Status: DC | PRN

## 2017-02-26 MED ORDER — ONDANSETRON HCL 4 MG/2ML IJ SOLN
INTRAMUSCULAR | Status: DC | PRN
  Administered 2017-02-26: 4 mg via INTRAVENOUS

## 2017-02-26 MED ORDER — LIDOCAINE HCL (PF) 1 % IJ SOLN
INTRAMUSCULAR | Status: DC | PRN
  Administered 2017-02-26: 2 mL

## 2017-02-26 MED ORDER — PROPOFOL 10 MG/ML IV BOLUS
INTRAVENOUS | Status: AC
  Filled 2017-02-26: qty 20

## 2017-02-26 MED ORDER — KETOROLAC TROMETHAMINE 30 MG/ML IJ SOLN
INTRAMUSCULAR | Status: DC | PRN
  Administered 2017-02-26: 30 mg via INTRAVENOUS

## 2017-02-26 MED ORDER — HYDROCHLOROTHIAZIDE 25 MG PO TABS
25.0000 mg | ORAL_TABLET | Freq: Every day | ORAL | Status: DC
Start: 2017-02-27 — End: 2017-02-27
  Administered 2017-02-27: 10:00:00 25 mg via ORAL
  Filled 2017-02-26: qty 1

## 2017-02-26 MED ORDER — METOCLOPRAMIDE HCL 5 MG/ML IJ SOLN: 5.0000 mg | Freq: Three times a day (TID) | INTRAMUSCULAR | Status: DC | PRN

## 2017-02-26 MED ORDER — CEFAZOLIN SODIUM-DEXTROSE 2-4 GM/100ML-% IV SOLN
2.0000 g | INTRAVENOUS | Status: AC
  Administered 2017-02-26: 2 g via INTRAVENOUS
  Filled 2017-02-26: qty 100

## 2017-02-26 MED ORDER — DOCUSATE SODIUM 100 MG PO CAPS
100.0000 mg | ORAL_CAPSULE | Freq: Two times a day (BID) | ORAL | Status: DC
  Administered 2017-02-26 – 2017-02-27 (×2): 100 mg via ORAL
  Filled 2017-02-26 (×2): qty 1

## 2017-02-26 MED ORDER — ONDANSETRON HCL 4 MG/2ML IJ SOLN: 4.0000 mg | Freq: Four times a day (QID) | INTRAMUSCULAR | Status: DC | PRN

## 2017-02-26 MED ORDER — HYDROCODONE-ACETAMINOPHEN 5-325 MG PO TABS
1.0000 | ORAL_TABLET | ORAL | Status: DC | PRN
  Administered 2017-02-26 (×2): 1 via ORAL
  Administered 2017-02-27 (×4): 2 via ORAL
  Filled 2017-02-26: qty 1
  Filled 2017-02-26: qty 2
  Filled 2017-02-26: qty 1
  Filled 2017-02-26 (×3): qty 2

## 2017-02-26 MED ORDER — PROPOFOL 500 MG/50ML IV EMUL
INTRAVENOUS | Status: DC | PRN
  Administered 2017-02-26: 50 ug/kg/min via INTRAVENOUS

## 2017-02-26 MED ORDER — FENTANYL CITRATE (PF) 100 MCG/2ML IJ SOLN
100.0000 ug | Freq: Once | INTRAMUSCULAR | Status: AC
Start: 2017-02-26 — End: 2017-02-26
  Administered 2017-02-26: 100 ug via INTRAVENOUS

## 2017-02-26 MED ORDER — PROPOFOL 10 MG/ML IV BOLUS
INTRAVENOUS | Status: DC | PRN
  Administered 2017-02-26: 20 mg via INTRAVENOUS
  Administered 2017-02-26: 10 mg via INTRAVENOUS
  Administered 2017-02-26 (×2): 20 mg via INTRAVENOUS
  Administered 2017-02-26 (×4): 10 mg via INTRAVENOUS
  Administered 2017-02-26: 30 mg via INTRAVENOUS
  Administered 2017-02-26: 20 mg via INTRAVENOUS
  Administered 2017-02-26: 10 mg via INTRAVENOUS
  Administered 2017-02-26: 20 mg via INTRAVENOUS
  Administered 2017-02-26: 10 mg via INTRAVENOUS
  Administered 2017-02-26 (×2): 20 mg via INTRAVENOUS

## 2017-02-26 MED ORDER — LEVOTHYROXINE SODIUM 75 MCG PO TABS
75.0000 ug | ORAL_TABLET | Freq: Every day | ORAL | Status: DC
  Administered 2017-02-27: 08:00:00 75 ug via ORAL
  Filled 2017-02-26: qty 1

## 2017-02-26 MED ORDER — FENTANYL CITRATE (PF) 100 MCG/2ML IJ SOLN
INTRAMUSCULAR | Status: AC
  Administered 2017-02-26: 100 ug via INTRAVENOUS
  Filled 2017-02-26: qty 2

## 2017-02-26 MED ORDER — PROPOFOL 10 MG/ML IV BOLUS
INTRAVENOUS | Status: AC
  Filled 2017-02-26: qty 60

## 2017-02-26 MED ORDER — CHLORHEXIDINE GLUCONATE 4 % EX LIQD
60.0000 mL | Freq: Once | CUTANEOUS | Status: AC
  Administered 2017-02-26: 4 via TOPICAL

## 2017-02-26 MED ORDER — MESALAMINE 1.2 G PO TBEC
4.8000 g | DELAYED_RELEASE_TABLET | Freq: Every day | ORAL | Status: DC
  Administered 2017-02-27: 4.8 g via ORAL
  Filled 2017-02-26: qty 4

## 2017-02-26 MED ORDER — ACETAMINOPHEN 325 MG PO TABS: 650.0000 mg | ORAL_TABLET | Freq: Four times a day (QID) | ORAL | Status: DC | PRN

## 2017-02-26 MED ORDER — PROPOFOL 10 MG/ML IV BOLUS
INTRAVENOUS | Status: AC
  Filled 2017-02-26: qty 40

## 2017-02-26 MED ORDER — FENTANYL CITRATE (PF) 100 MCG/2ML IJ SOLN: 25.0000 ug | INTRAMUSCULAR | Status: DC | PRN

## 2017-02-26 MED ORDER — TRANEXAMIC ACID 1000 MG/10ML IV SOLN
1000.0000 mg | INTRAVENOUS | Status: AC
  Administered 2017-02-26: 1000 mg via INTRAVENOUS
  Filled 2017-02-26: qty 1100

## 2017-02-26 MED ORDER — MIDAZOLAM HCL 5 MG/ML IJ SOLN
2.0000 mg | Freq: Once | INTRAMUSCULAR | Status: AC
  Administered 2017-02-26: 2 mg via INTRAVENOUS

## 2017-02-26 MED ORDER — BUPIVACAINE HCL (PF) 0.5 % IJ SOLN
INTRAMUSCULAR | Status: DC | PRN
  Administered 2017-02-26: 3 mL

## 2017-02-26 MED ORDER — POLYETHYLENE GLYCOL 3350 17 G PO PACK: 17.0000 g | PACK | Freq: Every day | ORAL | Status: DC | PRN

## 2017-02-26 MED ORDER — SODIUM CHLORIDE 0.9 % IV SOLN
1000.0000 mg | Freq: Once | INTRAVENOUS | Status: AC
  Administered 2017-02-26: 1000 mg via INTRAVENOUS
  Filled 2017-02-26: qty 1100

## 2017-02-26 MED ORDER — DIPHENHYDRAMINE HCL 12.5 MG/5ML PO ELIX: 12.5000 mg | ORAL_SOLUTION | ORAL | Status: DC | PRN

## 2017-02-26 MED ORDER — SODIUM CHLORIDE 0.9 % IV SOLN
INTRAVENOUS | Status: DC
  Administered 2017-02-26 – 2017-02-27 (×2): via INTRAVENOUS

## 2017-02-26 MED ORDER — STERILE WATER FOR IRRIGATION IR SOLN
Status: DC | PRN
  Administered 2017-02-26: 2000 mL

## 2017-02-26 MED ORDER — METOCLOPRAMIDE HCL 5 MG PO TABS: 5.0000 mg | ORAL_TABLET | Freq: Three times a day (TID) | ORAL | Status: DC | PRN

## 2017-02-26 MED ORDER — DEXAMETHASONE SODIUM PHOSPHATE 10 MG/ML IJ SOLN
INTRAMUSCULAR | Status: DC | PRN
  Administered 2017-02-26: 10 mg via INTRAVENOUS

## 2017-02-26 MED ORDER — SODIUM CHLORIDE 0.9 % IJ SOLN
INTRAMUSCULAR | Status: DC | PRN
  Administered 2017-02-26: 30 mL via INTRAVENOUS

## 2017-02-26 MED ORDER — AMLODIPINE BESYLATE 10 MG PO TABS
10.0000 mg | ORAL_TABLET | Freq: Every morning | ORAL | Status: DC
  Administered 2017-02-27: 10:00:00 10 mg via ORAL
  Filled 2017-02-26: qty 1

## 2017-02-26 MED ORDER — POVIDONE-IODINE 10 % EX SWAB
2.0000 "application " | Freq: Once | CUTANEOUS | Status: AC
  Administered 2017-02-26: 2 via TOPICAL

## 2017-02-26 MED ORDER — SENNA 8.6 MG PO TABS
2.0000 | ORAL_TABLET | Freq: Every day | ORAL | Status: DC
  Administered 2017-02-26: 17.2 mg via ORAL
  Filled 2017-02-26: qty 2

## 2017-02-26 MED ORDER — BUPIVACAINE HCL (PF) 0.25 % IJ SOLN
INTRAMUSCULAR | Status: AC
  Filled 2017-02-26: qty 30

## 2017-02-26 MED ORDER — HYDROMORPHONE HCL-NACL 0.5-0.9 MG/ML-% IV SOSY
0.5000 mg | PREFILLED_SYRINGE | INTRAVENOUS | Status: DC | PRN
  Filled 2017-02-26: qty 1

## 2017-02-26 MED ORDER — ISOPROPYL ALCOHOL 70 % SOLN
Status: DC | PRN
  Administered 2017-02-26: 1 via TOPICAL

## 2017-02-26 MED ORDER — LACTATED RINGERS IV SOLN
INTRAVENOUS | Status: DC
  Administered 2017-02-26 (×3): via INTRAVENOUS

## 2017-02-26 MED ORDER — ONDANSETRON HCL 4 MG PO TABS: 4.0000 mg | ORAL_TABLET | Freq: Four times a day (QID) | ORAL | Status: DC | PRN

## 2017-02-26 MED ORDER — BUPIVACAINE-EPINEPHRINE 0.25% -1:200000 IJ SOLN
INTRAMUSCULAR | Status: DC | PRN
  Administered 2017-02-26: 30 mL

## 2017-02-26 MED ORDER — CEFAZOLIN SODIUM-DEXTROSE 2-4 GM/100ML-% IV SOLN
2.0000 g | Freq: Four times a day (QID) | INTRAVENOUS | Status: AC
  Administered 2017-02-26 – 2017-02-27 (×2): 2 g via INTRAVENOUS
  Filled 2017-02-26 (×2): qty 100

## 2017-02-26 MED ORDER — DEXAMETHASONE SODIUM PHOSPHATE 10 MG/ML IJ SOLN
10.0000 mg | Freq: Once | INTRAMUSCULAR | Status: AC
  Administered 2017-02-27: 10:00:00 10 mg via INTRAVENOUS
  Filled 2017-02-26: qty 1

## 2017-02-26 MED ORDER — MIDAZOLAM HCL 2 MG/2ML IJ SOLN
INTRAMUSCULAR | Status: AC
  Filled 2017-02-26: qty 2

## 2017-02-26 MED ORDER — ASPIRIN 81 MG PO CHEW
81.0000 mg | CHEWABLE_TABLET | Freq: Two times a day (BID) | ORAL | Status: DC
  Administered 2017-02-26 – 2017-02-27 (×2): 81 mg via ORAL
  Filled 2017-02-26 (×2): qty 1

## 2017-02-26 MED ORDER — PHENOL 1.4 % MT LIQD: 1.0000 | OROMUCOSAL | Status: DC | PRN

## 2017-02-26 MED ORDER — SODIUM CHLORIDE 0.9 % IR SOLN
Status: DC | PRN
  Administered 2017-02-26: 2000 mL

## 2017-02-26 MED ORDER — ACETAMINOPHEN 650 MG RE SUPP: 650.0000 mg | Freq: Four times a day (QID) | RECTAL | Status: DC | PRN

## 2017-02-26 MED ORDER — ACETAMINOPHEN 10 MG/ML IV SOLN
1000.0000 mg | INTRAVENOUS | Status: AC
  Administered 2017-02-26: 1000 mg via INTRAVENOUS
  Filled 2017-02-26: qty 100

## 2017-02-26 MED ORDER — KETOROLAC TROMETHAMINE 30 MG/ML IJ SOLN
INTRAMUSCULAR | Status: AC
  Filled 2017-02-26: qty 1

## 2017-02-26 MED ORDER — DEXAMETHASONE SODIUM PHOSPHATE 10 MG/ML IJ SOLN
INTRAMUSCULAR | Status: AC
  Filled 2017-02-26: qty 1

## 2017-02-26 MED ORDER — KETOROLAC TROMETHAMINE 15 MG/ML IJ SOLN
7.5000 mg | Freq: Four times a day (QID) | INTRAMUSCULAR | Status: DC
  Administered 2017-02-26 – 2017-02-27 (×2): 7.5 mg via INTRAVENOUS
  Filled 2017-02-26 (×2): qty 1

## 2017-02-26 MED ORDER — ALUM & MAG HYDROXIDE-SIMETH 200-200-20 MG/5ML PO SUSP: 30.0000 mL | ORAL | Status: DC | PRN

## 2017-02-26 MED ORDER — BUPIVACAINE HCL (PF) 0.5 % IJ SOLN
INTRAMUSCULAR | Status: AC
  Filled 2017-02-26: qty 30

## 2017-02-26 MED ORDER — ONDANSETRON HCL 4 MG/2ML IJ SOLN
INTRAMUSCULAR | Status: AC
Start: 2017-02-26 — End: ?
  Filled 2017-02-26: qty 2

## 2017-02-26 MED ORDER — SODIUM CHLORIDE 0.9 % IJ SOLN
INTRAMUSCULAR | Status: AC
  Filled 2017-02-26: qty 50

## 2017-02-26 MED ORDER — ISOPROPYL ALCOHOL 70 % SOLN
Status: AC
  Filled 2017-02-26: qty 480

## 2017-02-26 SURGICAL SUPPLY — 58 items
ADH SKN CLS APL DERMABOND .7 (GAUZE/BANDAGES/DRESSINGS) ×2
BAG SPEC THK2 15X12 ZIP CLS (MISCELLANEOUS)
BAG ZIPLOCK 12X15 (MISCELLANEOUS) IMPLANT
BANDAGE ACE 4X5 VEL STRL LF (GAUZE/BANDAGES/DRESSINGS) ×3 IMPLANT
BANDAGE ACE 6X5 VEL STRL LF (GAUZE/BANDAGES/DRESSINGS) ×3 IMPLANT
BLADE SAW RECIPROCATING 77.5 (BLADE) ×3 IMPLANT
CAPT KNEE TRIATH TK-4 ×2 IMPLANT
CHLORAPREP W/TINT 26ML (MISCELLANEOUS) ×6 IMPLANT
COVER SURGICAL LIGHT HANDLE (MISCELLANEOUS) ×3 IMPLANT
CUFF TOURN SGL QUICK 34 (TOURNIQUET CUFF) ×3
CUFF TRNQT CYL 34X4X40X1 (TOURNIQUET CUFF) ×1 IMPLANT
DECANTER SPIKE VIAL GLASS SM (MISCELLANEOUS) ×4 IMPLANT
DERMABOND ADVANCED (GAUZE/BANDAGES/DRESSINGS) ×4
DERMABOND ADVANCED .7 DNX12 (GAUZE/BANDAGES/DRESSINGS) ×2 IMPLANT
DRAPE SHEET LG 3/4 BI-LAMINATE (DRAPES) ×6 IMPLANT
DRAPE U-SHAPE 47X51 STRL (DRAPES) ×3 IMPLANT
DRSG AQUACEL AG ADV 3.5X10 (GAUZE/BANDAGES/DRESSINGS) ×3 IMPLANT
DRSG TEGADERM 4X4.75 (GAUZE/BANDAGES/DRESSINGS) IMPLANT
ELECT BLADE TIP CTD 4 INCH (ELECTRODE) ×3 IMPLANT
ELECT REM PT RETURN 15FT ADLT (MISCELLANEOUS) ×3 IMPLANT
EVACUATOR 1/8 PVC DRAIN (DRAIN) IMPLANT
GAUZE SPONGE 4X4 12PLY STRL (GAUZE/BANDAGES/DRESSINGS) ×3 IMPLANT
GLOVE BIO SURGEON STRL SZ8.5 (GLOVE) ×6 IMPLANT
GLOVE BIOGEL PI IND STRL 8.5 (GLOVE) ×1 IMPLANT
GLOVE BIOGEL PI INDICATOR 8.5 (GLOVE) ×2
GOWN SPEC L3 XXLG W/TWL (GOWN DISPOSABLE) ×3 IMPLANT
HANDPIECE INTERPULSE COAX TIP (DISPOSABLE) ×3
HOOD PEEL AWAY FLYTE STAYCOOL (MISCELLANEOUS) ×6 IMPLANT
MARKER SKIN DUAL TIP RULER LAB (MISCELLANEOUS) ×3 IMPLANT
NDL SPNL 18GX3.5 QUINCKE PK (NEEDLE) ×1 IMPLANT
NEEDLE SPNL 18GX3.5 QUINCKE PK (NEEDLE) ×3 IMPLANT
NS IRRIG 1000ML POUR BTL (IV SOLUTION) ×3 IMPLANT
PACK TOTAL KNEE CUSTOM (KITS) ×3 IMPLANT
PADDING CAST COTTON 6X4 STRL (CAST SUPPLIES) ×3 IMPLANT
POSITIONER SURGICAL ARM (MISCELLANEOUS) ×3 IMPLANT
SAW OSC TIP CART 19.5X105X1.3 (SAW) ×3 IMPLANT
SEALER BIPOLAR AQUA 6.0 (INSTRUMENTS) ×3 IMPLANT
SET HNDPC FAN SPRY TIP SCT (DISPOSABLE) ×1 IMPLANT
SET PAD KNEE POSITIONER (MISCELLANEOUS) ×3 IMPLANT
SPONGE DRAIN TRACH 4X4 STRL 2S (GAUZE/BANDAGES/DRESSINGS) IMPLANT
SPONGE LAP 18X18 X RAY DECT (DISPOSABLE) IMPLANT
SUCTION FRAZIER HANDLE 12FR (TUBING) ×2
SUCTION TUBE FRAZIER 12FR DISP (TUBING) ×1 IMPLANT
SUT MNCRL AB 3-0 PS2 18 (SUTURE) ×3 IMPLANT
SUT MON AB 2-0 CT1 36 (SUTURE) ×6 IMPLANT
SUT STRATAFIX PDO 1 14 VIOLET (SUTURE) ×3
SUT STRATFX PDO 1 14 VIOLET (SUTURE) ×1
SUT VIC AB 1 CT1 36 (SUTURE) ×9 IMPLANT
SUT VIC AB 2-0 CT1 27 (SUTURE) ×3
SUT VIC AB 2-0 CT1 TAPERPNT 27 (SUTURE) ×1 IMPLANT
SUTURE STRATFX PDO 1 14 VIOLET (SUTURE) ×1 IMPLANT
SYR 50ML LL SCALE MARK (SYRINGE) ×3 IMPLANT
TOWER CARTRIDGE SMART MIX (DISPOSABLE) IMPLANT
TRAY FOLEY W/METER SILVER 16FR (SET/KITS/TRAYS/PACK) ×2 IMPLANT
WATER STERILE IRR 1000ML POUR (IV SOLUTION) ×6 IMPLANT
WRAP KNEE MAXI GEL POST OP (GAUZE/BANDAGES/DRESSINGS) ×3 IMPLANT
YANKAUER SUCT BULB TIP 10FT TU (MISCELLANEOUS) ×3 IMPLANT
YANKAUER SUCT BULB TIP NO VENT (SUCTIONS) ×2 IMPLANT

## 2017-02-26 NOTE — Anesthesia Preprocedure Evaluation (Addendum)
Anesthesia Evaluation  Patient identified by MRN, date of birth, ID band Patient awake    Reviewed: Allergy & Precautions, NPO status , Patient's Chart, lab work & pertinent test results  Airway Mallampati: II  TM Distance: >3 FB     Dental   Pulmonary neg pulmonary ROS, former smoker,    breath sounds clear to auscultation       Cardiovascular hypertension,  Rhythm:Regular Rate:Normal     Neuro/Psych    GI/Hepatic Neg liver ROS, PUD, GERD  ,  Endo/Other  Hypothyroidism   Renal/GU negative Renal ROS     Musculoskeletal  (+) Arthritis ,   Abdominal   Peds  Hematology   Anesthesia Other Findings   Reproductive/Obstetrics                            Anesthesia Physical Anesthesia Plan  ASA: III  Anesthesia Plan: Spinal   Post-op Pain Management:  Regional for Post-op pain   Induction: Intravenous  PONV Risk Score and Plan: 2 and Ondansetron and Dexamethasone  Airway Management Planned:   Additional Equipment:   Intra-op Plan:   Post-operative Plan: Extubation in OR  Informed Consent: I have reviewed the patients History and Physical, chart, labs and discussed the procedure including the risks, benefits and alternatives for the proposed anesthesia with the patient or authorized representative who has indicated his/her understanding and acceptance.   Dental advisory given  Plan Discussed with: Anesthesiologist  Anesthesia Plan Comments:        Anesthesia Quick Evaluation

## 2017-02-26 NOTE — Transfer of Care (Signed)
Immediate Anesthesia Transfer of Care Note  Patient: Jerome Stark  Procedure(s) Performed: Procedure(s) with comments: LEFT TOTAL KNEE ARTHROPLASTY (Left) - Needs RNFA  Patient Location: PACU  Anesthesia Type:Spinal  Level of Consciousness:  sedated, patient cooperative and responds to stimulation  Airway & Oxygen Therapy:Patient Spontanous Breathing and Patient connected to face mask oxgen  Post-op Assessment:  Report given to PACU RN and Post -op Vital signs reviewed and stable  Post vital signs:  Reviewed and stable  Last Vitals:  Vitals:   02/26/17 1454 02/26/17 1455  BP:    Pulse: 74 79  Resp: 17 17  Temp:      Complications: No apparent anesthesia complications

## 2017-02-26 NOTE — Anesthesia Postprocedure Evaluation (Signed)
Anesthesia Post Note  Patient: Jerome Stark  Procedure(s) Performed: Procedure(s) (LRB): LEFT TOTAL KNEE ARTHROPLASTY (Left)     Patient location during evaluation: PACU Anesthesia Type: Spinal Level of consciousness: awake Pain management: pain level controlled Vital Signs Assessment: post-procedure vital signs reviewed and stable Respiratory status: spontaneous breathing Postop Assessment: spinal receding Anesthetic complications: no    Last Vitals:  Vitals:   02/26/17 1825 02/26/17 1845  BP: 131/82 128/82  Pulse: 69 66  Resp: 18 16  Temp: 36.5 C 36.6 C    Last Pain:  Vitals:   02/26/17 1345  TempSrc: Oral                 Marrio Scribner

## 2017-02-26 NOTE — Op Note (Signed)
OPERATIVE REPORT  SURGEON: Rod Can, MD.   ASSISTANT: Thomasene Mohair, RNFA.  PREOPERATIVE DIAGNOSIS: Left knee arthritis.   POSTOPERATIVE DIAGNOSIS: Left knee arthritis.   PROCEDURE: Left total knee arthroplasty.   IMPLANTS: Stryker Triathlon CR femur, size 4. Stryker Triathlon Tritanium tibia, size 5. X3 polyethelyene insert, size 9 mm, CR. Tritanium 3 button asymmetric patella, size 35 mm.  ANESTHESIA:  Spinal  TOURNIQUET TIME: not utilized.  ESTIMATED BLOOD LOSS: 100 mL.  ANTIBIOTICS: 2 g Ancef.  DRAINS: None.  COMPLICATIONS: None   CONDITION: PACU - hemodynamically stable.   BRIEF CLINICAL NOTE: Jerome Stark is a 71 y.o. male with a long-standing history of Left knee arthritis. After failing conservative management, the patient was indicated for total knee arthroplasty. The risks, benefits, and alternatives to the procedure were explained, and the patient elected to proceed.  PROCEDURE IN DETAIL: Adductor canal block was obtained in the pre-op holding area. Once inside the operative room, spinal anesthesia was obtained, and a foley catheter was inserted. The patient was then positioned, a nonsterile tourniquet was placed, and the lower extremity was prepped and draped in the normal sterile surgical fashion. A time-out was called verifying side and site of surgery. The patient received IV antibiotics within 60 minutes of beginning the procedure. The tourniquet was not utilized.  An anterior approach to the knee was performed utilizing a midvastus arthrotomy. A medial release was performed and the patellar fat pad was excised. A 5-degree distal femur valgus cut was made with an intramedullary guide. A freehand patellar resection was performed, and the patella was sized an prepared with 3 lug holes.  The proximal tibial resection was performed using an extramedullary guide, leaving a bone island for the PCL. The menisci were excised. A spacer block was placed, and  the alignment and balance in extension were confirmed.   The distal femur was sized using the 3-degree external rotation guide referencing the posterior femoral cortex. The appropriate 4-in-1 cutting block was pinned into place, and the rotation was checked using a spacer block at 90 degrees of flexion. The remaining femoral cuts were performed, taking care to protect the MCL.  The tibia was sized and the trial tray was pinned into place. The remaining trail components were inserted. The knee was stable to varus and valgus stress through a full range of motion. The patella tracked centrally, and the PCL was well balanced. The trial components were removed, and the proximal tibial surface was prepared. Final components were impacted into place. The knee was tested for a final time and found to be well balanced.  The wound was copiously irrigated with a dilute betadine solution followed by normal saline with pulse lavage. Marcaine solution was injected into the periarticular soft tissue. The wound was closed in layers using #1 Vicryl and Stratafix for the fascia, 2-0 Vicryl for the subcutaneous fat, 2-0 Monocryl for the deep dermal layer, 3-0 running Monocryl subcuticular Stitch, and Dermabond for the skin. Once the glue was fully dried, an Aquacell Ag and compressive dressing were applied. The patient was transported to the recovery room in stable ondition. Sponge, needle, and instrument counts were correct at the end of the case x2. The patient tolerated the procedure well and there were no known complications.

## 2017-02-26 NOTE — Anesthesia Procedure Notes (Signed)
Anesthesia Regional Block: Adductor canal block   Pre-Anesthetic Checklist: ,, timeout performed, Correct Patient, Correct Site, Correct Laterality, Correct Procedure, Correct Position, site marked, Risks and benefits discussed, Surgical consent,  Pre-op evaluation,  At surgeon's request  Laterality: Left  Prep: chloraprep       Needles:   Needle Type: Stimulator Needle - 80          Additional Needles:   Procedures: Doppler guided, Ultrasound guided,,,,,,  Narrative:  Start time: 02/26/2017 2:35 PM End time: 02/26/2017 2:50 PM Injection made incrementally with aspirations every 5 mL.  Performed by: Personally  Anesthesiologist: Belinda Block

## 2017-02-26 NOTE — Discharge Instructions (Signed)
Dr. Rod Can Total Joint Specialist [facility] [address] [place], [place] [address] [phone]  TOTAL KNEE REPLACEMENT POSTOPERATIVE DIRECTIONS    Knee Rehabilitation, Guidelines Following Surgery  Results after knee surgery are often greatly improved when you follow the exercise, range of motion and muscle strengthening exercises prescribed by your doctor. Safety measures are also important to protect the knee from further injury. Any time any of these exercises cause you to have increased pain or swelling in your knee joint, decrease the amount until you are comfortable again and slowly increase them. If you have problems or questions, call your caregiver or physical therapist for advice.   WEIGHT BEARING Weight bearing as tolerated with assist device (walker, cane, etc) as directed, use it as long as suggested by your surgeon or therapist, typically at least 4-6 weeks.  HOME CARE INSTRUCTIONS  Remove items at home which could result in a fall. This includes throw rugs or furniture in walking pathways.  Continue medications as instructed at time of discharge. You may have some home medications which will be placed on hold until you complete the course of blood thinner medication.  You may start showering once you are discharged home but do not submerge the incision under water. Just pat the incision dry and apply a dry gauze dressing on daily. Walk with walker as instructed.  You may resume a sexual relationship in one month or when given the OK by your doctor.   Use walker as long as suggested by your caregivers.  Avoid periods of inactivity such as sitting longer than an hour when not asleep. This helps prevent blood clots.  You may put full weight on your legs and walk as much as is comfortable.  You may return to work once you are cleared by your doctor.  Do not drive a car for 6 weeks or until released by you surgeon.   Do not drive while  taking narcotics.  Wear the elastic stockings for three weeks following surgery during the day but you may remove then at night. Make sure you keep all of your appointments after your operation with all of your doctors and caregivers. You should call the office at the above phone number and make an appointment for approximately two weeks after the date of your surgery. Do not remove your surgical dressing. The dressing is waterproof; you may take showers in 3 days, but do not take tub baths or submerge the dressing. Please pick up a stool softener and laxative for home use as long as you are requiring pain medications.  ICE to the affected knee every three hours for 30 minutes at a time and then as needed for pain and swelling.  Continue to use ice on the knee for pain and swelling from surgery. You may notice swelling that will progress down to the foot and ankle.  This is normal after surgery.  Elevate the leg when you are not up walking on it.   It is important for you to complete the blood thinner medication as prescribed by your doctor.  Continue to use the breathing machine which will help keep your temperature down.  It is common for your temperature to cycle up and down following surgery, especially at night when you are not up moving around and exerting yourself.  The breathing machine keeps your lungs expanded and your temperature down.  RANGE OF MOTION AND STRENGTHENING EXERCISES  Rehabilitation of the knee is important following a  knee injury or an operation. After just a few days of immobilization, the muscles of the thigh which control the knee become weakened and shrink (atrophy). Knee exercises are designed to build up the tone and strength of the thigh muscles and to improve knee motion. Often times heat used for twenty to thirty minutes before working out will loosen up your tissues and help with improving the range of motion but do not use heat for the first two weeks following  surgery. These exercises can be done on a training (exercise) mat, on the floor, on a table or on a bed. Use what ever works the best and is most comfortable for you Knee exercises include:  Leg Lifts - While your knee is still immobilized in a splint or cast, you can do straight leg raises. Lift the leg to 60 degrees, hold for 3 sec, and slowly lower the leg. Repeat 10-20 times 2-3 times daily. Perform this exercise against resistance later as your knee gets better.  Quad and Hamstring Sets - Tighten up the muscle on the front of the thigh (Quad) and hold for 5-10 sec. Repeat this 10-20 times hourly. Hamstring sets are done by pushing the foot backward against an object and holding for 5-10 sec. Repeat as with quad sets.  A rehabilitation program following serious knee injuries can speed recovery and prevent re-injury in the future due to weakened muscles. Contact your doctor or a physical therapist for more information on knee rehabilitation.   SKILLED REHAB INSTRUCTIONS: If the patient is transferred to a skilled rehab facility following release from the hospital, a list of the current medications will be sent to the facility for the patient to continue.  When discharged from the skilled rehab facility, please have the facility set up the patient's [place] prior to being released. Also, the skilled facility will be responsible for providing the patient with their medications at time of release from the facility to include their pain medication, the muscle relaxants, and their blood thinner medication. If the patient is still at the rehab facility at time of the two week follow up appointment, the skilled rehab facility will also need to assist the patient in arranging follow up appointment in our office and any transportation needs.  MAKE SURE YOU:  Understand these instructions.  Will watch your condition.  Will get help right away if you are not doing well or get worse.     Pick up stool softner and laxative for home use following surgery while on pain medications. Do NOT remove your dressing. You may shower.  Do not take tub baths or submerge incision under water. May shower starting three days after surgery. Please use a clean towel to pat the incision dry following showers. Continue to use ice for pain and swelling after surgery. Do not use any lotions or creams on the incision until instructed by your surgeon.

## 2017-02-26 NOTE — Anesthesia Procedure Notes (Signed)
Spinal  Patient location during procedure: OR Start time: 02/26/2017 3:08 PM End time: 02/26/2017 3:12 PM Staffing Anesthesiologist: Belinda Block Resident/CRNA: Darlys Gales R Performed: resident/CRNA  Preanesthetic Checklist Completed: patient identified, site marked, surgical consent, pre-op evaluation, timeout performed, IV checked, risks and benefits discussed and monitors and equipment checked Spinal Block Patient position: sitting Prep: DuraPrep Patient monitoring: heart rate, cardiac monitor, continuous pulse ox and blood pressure Approach: midline Location: L3-4 Injection technique: single-shot Needle Needle type: Pencan  Needle gauge: 24 G Needle length: 10 cm Needle insertion depth: 7 cm Assessment Sensory level: T6 Additional Notes 1st attempt, CSF dripping but unable to aspirate. 2nd attempt one level up, csf return with aspiration. Negative heme/parasthesia.

## 2017-02-26 NOTE — Interval H&P Note (Signed)
History and Physical Interval Note:  02/26/2017 2:10 PM  Jerome Stark  has presented today for surgery, with the diagnosis of Degenerative joint disease, left knee  The various methods of treatment have been discussed with the patient and family. After consideration of risks, benefits and other options for treatment, the patient has consented to  Procedure(s) with comments: LEFT TOTAL KNEE ARTHROPLASTY WITH COMPUTER NAVIGATION (Left) - Needs RNFA as a surgical intervention .  The patient's history has been reviewed, patient examined, no change in status, stable for surgery.  I have reviewed the patient's chart and labs.  Questions were answered to the patient's satisfaction.     Gordon Vandunk, Horald Pollen

## 2017-02-26 NOTE — H&P (View-Only) (Signed)
TOTAL KNEE ADMISSION H&P  Patient is being admitted for left total knee arthroplasty.  Subjective:  Chief Complaint:left knee pain.  HPI: Jerome Stark, 71 y.o. male, has a history of pain and functional disability in the left knee due to arthritis and has failed non-surgical conservative treatments for greater than 12 weeks to includeNSAID's and/or analgesics, corticosteriod injections, flexibility and strengthening excercises, supervised PT with diminished ADL's post treatment, use of assistive devices, weight reduction as appropriate and activity modification.  Onset of symptoms was gradual, starting >10 years ago with gradually worsening course since that time. The patient noted no past surgery on the left knee(s).  Patient currently rates pain in the left knee(s) at 10 out of 10 with activity. Patient has night pain, worsening of pain with activity and weight bearing, pain that interferes with activities of daily living, pain with passive range of motion, crepitus and joint swelling.  Patient has evidence of subchondral cysts, subchondral sclerosis, periarticular osteophytes and joint space narrowing by imaging studies. There is no active infection.  Patient Active Problem List   Diagnosis Date Noted  . Osteoarthritis of right knee 01/08/2017  . Degenerative arthritis of right knee 01/08/2017  . Ulcerative colitis ([place])   . Protein-calorie malnutrition, severe ([place]) 11/28/2014  . Hypothyroidism 11/27/2014  . Benign essential HTN 11/27/2014  . Colitis 11/26/2014   Past Medical History:  Diagnosis Date  . Hyperlipidemia   . Hypertension   . Hypothyroidism   . 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 Ulcerative colitis [facility])     Past Surgical History:  Procedure Laterality Date  . BACK SURGERY    . 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 COLONOSCOPY  March 2015   Dr. Britta Mccreedy: diminutive hyperplastic polyp. scope advanced to the cecum, no intubation of TI, no random biopsies  performed  . COLONOSCOPY N/A 11/29/2014   Dr. Gala Romney: left-sided proctocolitis   . KNEE ARTHROPLASTY Right 01/08/2017   Procedure: RIGHT TOTAL KNEE ARTHROPLASTY WITH COMPUTER NAVIGATION;  Surgeon: Rod Can, MD;  Location: [facility] ORS;  Service: Orthopedics;  Laterality: Right;  Needs RNFA; Adductor block  . LUNG SURGERY       (Not in a hospital admission) No Known Allergies  Social History  Substance Use Topics  . Smoking status: Former Smoker    Quit date: 02/14/1985  . Smokeless tobacco: Never Used     Comment: smoked 1 ppd for 25 years, quit at age 66  . Alcohol use 0.0 oz/week     Comment: occasional wine    Family History  Problem Relation Age of Onset  . Hypertension Mother   . Heart disease Mother   . Hypertension Other   . Heart attack Brother        coronary artery stent at age 37  . Heart attack Maternal Grandfather        died of MI in late 4s  . Colon cancer Neg Hx      Review of Systems  Constitutional: Negative.   HENT: Negative.   Eyes: Negative.   Respiratory: Negative.   Cardiovascular: Negative.   Gastrointestinal: Negative.   Genitourinary: Negative.   Musculoskeletal: Positive for joint pain.  Skin: Negative.   Neurological: Negative.   Endo/Heme/Allergies: Negative.   Psychiatric/Behavioral: Negative.     Objective:  Physical Exam  Vitals reviewed. Constitutional: He is oriented to person, place, and time. He appears well-developed and well-nourished.  HENT:  Head: Normocephalic and atraumatic.  Eyes: Conjunctivae  and EOM are normal. Pupils are equal, round, and reactive to light.  Neck: Normal range of motion. Neck supple.  Cardiovascular: Normal rate, regular rhythm and intact distal pulses.   Respiratory: Effort normal. No respiratory distress.  GI: Soft. He exhibits no distension.  Genitourinary:  Genitourinary Comments: deferred  Musculoskeletal:       Left knee: He exhibits decreased range of motion, swelling and effusion.  Tenderness found. Medial joint line and lateral joint line tenderness noted.  Neurological: He is alert and oriented to person, place, and time. He has normal reflexes.  Skin: Skin is warm and dry.  Psychiatric: He has a normal mood and affect. His behavior is normal. Judgment and thought content normal.    Vital signs in last 24 hours: @VSRANGES @  Labs:   Estimated body mass index is 26.36 kg/m as calculated from the following:   Height as of 01/08/17: 5' 11"  (1.803 m).   Weight as of 01/08/17: 85.7 kg (189 lb).   Imaging Review Plain radiographs demonstrate severe degenerative joint disease of the left knee(s). The overall alignment issignificant varus. The bone quality appears to be adequate for age and reported activity level.  Assessment/Plan:  End stage arthritis, left knee   The patient history, physical examination, clinical judgment of the provider and imaging studies are consistent with end stage degenerative joint disease of the left knee(s) and total knee arthroplasty is deemed medically necessary. The treatment options including medical management, injection therapy arthroscopy and arthroplasty were discussed at length. The risks and benefits of total knee arthroplasty were presented and reviewed. The risks due to aseptic loosening, infection, stiffness, patella tracking problems, thromboembolic complications and other imponderables were discussed. The patient acknowledged the explanation, agreed to proceed with the plan and consent was signed. Patient is being admitted for inpatient treatment for surgery, pain control, PT, OT, prophylactic antibiotics, VTE prophylaxis, progressive ambulation and ADL's and discharge planning. The patient is planning to be discharged outpatient PT

## 2017-02-26 NOTE — Progress Notes (Signed)
AssistedDr. Oletta Lamas with left, ultrasound guided, adductor canal block. Side rails up, monitors on throughout procedure. See vital signs in flow sheet. Tolerated Procedure well.

## 2017-02-27 DIAGNOSIS — M1712 Unilateral primary osteoarthritis, left knee: Secondary | ICD-10-CM | POA: Diagnosis not present

## 2017-02-27 LAB — BASIC METABOLIC PANEL
Anion gap: 7 (ref 5–15)
BUN: 13 mg/dL (ref 6–20)
CALCIUM: 8.5 mg/dL — AB (ref 8.9–10.3)
CO2: 23 mmol/L (ref 22–32)
CREATININE: 0.86 mg/dL (ref 0.61–1.24)
Chloride: 107 mmol/L (ref 101–111)
GFR calc Af Amer: >60 mL/min (ref >60)
GLUCOSE: 229 mg/dL — AB (ref 65–99)
POTASSIUM: 3.7 mmol/L (ref 3.5–5.1)
SODIUM: 137 mmol/L (ref 135–145)

## 2017-02-27 LAB — CBC
HCT: 35.4 % — ABNORMAL LOW (ref 39.0–52.0)
Hemoglobin: 12.3 g/dL — ABNORMAL LOW (ref 13.0–17.0)
MCH: 30.6 pg (ref 26.0–34.0)
MCHC: 34.7 g/dL (ref 30.0–36.0)
MCV: 88.1 fL (ref 78.0–100.0)
PLATELETS: 217 10*3/uL (ref 150–400)
RBC: 4.02 MIL/uL — AB (ref 4.22–5.81)
RDW: 13.9 % (ref 11.5–15.5)
WBC: 13.5 10*3/uL — ABNORMAL HIGH (ref 4.0–10.5)

## 2017-02-27 MED ORDER — HYDROCODONE-ACETAMINOPHEN 5-325 MG PO TABS: 1.0000 | ORAL_TABLET | ORAL | 0 refills | Status: DC | PRN

## 2017-02-27 MED ORDER — ASPIRIN 81 MG PO CHEW: 81.0000 mg | CHEWABLE_TABLET | Freq: Two times a day (BID) | ORAL | 1 refills | Status: DC

## 2017-02-27 MED ORDER — ONDANSETRON HCL 4 MG PO TABS: 4.0000 mg | ORAL_TABLET | Freq: Four times a day (QID) | ORAL | 0 refills | Status: DC | PRN

## 2017-02-27 NOTE — Progress Notes (Signed)
Physical Therapy Treatment Patient Details Name: Jerome Stark MRN: [id number] DOB: 1946/02/23 Today's Date: 02/27/2017    History of Present Illness Pt s/p L TKR and with hx of recent R TKR    PT Comments    Pt progressing well with mobility.  Reviewed therex and stairs.  Pt reports first OP PT appt 03/02/17   Follow Up Recommendations  DC plan and follow up therapy as arranged by surgeon     Equipment Recommendations  None recommended by PT    Recommendations for Other Services       Precautions / Restrictions Precautions Precautions: Knee;Fall Restrictions Weight Bearing Restrictions: No Other Position/Activity Restrictions: WBAT    Mobility  Bed Mobility Overal bed mobility: Needs Assistance Bed Mobility: Supine to Sit     Supine to sit: Supervision     General bed mobility comments: min cues for sequence  Transfers Overall transfer level: Needs assistance Equipment used: Rolling walker (2 wheeled) Transfers: Sit to/from Stand Sit to Stand: Supervision         General transfer comment: cues for LE management and use of UEs to self assist  Ambulation/Gait Ambulation/Gait assistance: Min guard;Supervision Ambulation Distance (Feet): 180 Feet Assistive device: Rolling walker (2 wheeled) Gait Pattern/deviations: Step-to pattern;Step-through pattern;Decreased step length - right;Decreased step length - left;Shuffle;Trunk flexed Gait velocity: decr Gait velocity interpretation: Below normal speed for age/gender General Gait Details: cues for posture, position from RW and initial sequence   Stairs Stairs: Yes   Stair Management: No rails;Forwards;With walker;Step to pattern Number of Stairs: 2 General stair comments: min cues for sequence and foot placement.  Pt utilizing door frame to ascend stairs and RW to descend  Wheelchair Mobility    Modified Rankin (Stroke Patients Only)       Balance                                            Cognition Arousal/Alertness: Awake/alert Behavior During Therapy: WFL for tasks assessed/performed Overall Cognitive Status: Within Functional Limits for tasks assessed                                        Exercises Total Joint Exercises Ankle Circles/Pumps: AROM;Both;15 reps;Supine Quad Sets: AROM;Both;10 reps;Supine Heel Slides: AAROM;Left;15 reps;Supine Straight Leg Raises: AAROM;AROM;Left;15 reps;Supine Knee Flexion: Seated;15 reps;Left;AROM;AAROM    General Comments        Pertinent Vitals/Pain Pain Assessment: 0-10 Pain Score: 4  Pain Location: L knee Pain Descriptors / Indicators: Aching;Sore Pain Intervention(s): Limited activity within patient's tolerance;Monitored during session;Premedicated before session;Ice applied    Home Living                      Prior Function            PT Goals (current goals can now be found in the care plan section) Acute Rehab PT Goals Patient Stated Goal: Regain IND PT Goal Formulation: With patient Time For Goal Achievement: 03/01/17 Potential to Achieve Goals: Good Progress towards PT goals: Progressing toward goals    Frequency    7X/week      PT Plan Current plan remains appropriate    Co-evaluation              AM-PAC PT "6 Clicks" Daily Activity  Outcome Measure  Difficulty turning over in bed (including adjusting bedclothes, sheets and blankets)?: A Little Difficulty moving from lying on back to sitting on the side of the bed? : A Little Difficulty sitting down on and standing up from a chair with arms (e.g., wheelchair, bedside commode, etc,.)?: A Little Help needed moving to and from a bed to chair (including a wheelchair)?: A Little Help needed walking in hospital room?: A Little Help needed climbing 3-5 steps with a railing? : A Little 6 Click Score: 18    End of Session Equipment Utilized During Treatment: Gait belt Activity Tolerance: Patient tolerated  treatment well Patient left: in chair;with call bell/phone within reach;with family/visitor present Nurse Communication: Mobility status PT Visit Diagnosis: Difficulty in walking, not elsewhere classified (R26.2)     Time: 7026-3785 PT Time Calculation (min) (ACUTE ONLY): 25 min  Charges:  $Gait Training: 8-22 mins $Therapeutic Exercise: 8-22 mins                    G Codes:       Pg [phone]    Fue Cervenka 02/27/2017, 2:18 PM

## 2017-02-27 NOTE — Progress Notes (Signed)
   Subjective:  Patient reports pain as mild to moderate.  Denies N/V/CP/SOB.  Objective:   VITALS:   Vitals:   02/26/17 2049 02/26/17 2139 02/27/17 0212 02/27/17 0524  BP: 135/83 131/83 130/69 131/75  Pulse: 78 71 72 83  Resp: 16 16 16 16   Temp: 98.5 F (36.9 C) 98.1 F (36.7 C) 100.3 F (37.9 C) 98.4 F (36.9 C)  TempSrc: Oral Oral Oral Oral  SpO2: 99% 99% 99% 97%  Weight:      Height:        NAD ABD soft Sensation intact distally Intact pulses distally Dorsiflexion/Plantar flexion intact Incision: dressing C/D/I Compartment soft   Lab Results  Component Value Date   WBC 13.5 (H) 02/27/2017   HGB 12.3 (L) 02/27/2017   HCT 35.4 (L) 02/27/2017   MCV 88.1 02/27/2017   PLT 217 02/27/2017   BMET    Component Value Date/Time   NA 137 02/27/2017 0519   K 3.7 02/27/2017 0519   CL 107 02/27/2017 0519   CO2 23 02/27/2017 0519   GLUCOSE 229 (H) 02/27/2017 0519   BUN 13 02/27/2017 0519   CREATININE 0.86 02/27/2017 0519   CREATININE 0.97 06/14/2015 0946   CALCIUM 8.5 (L) 02/27/2017 0519   GFRNONAA >60 02/27/2017 0519   GFRAA >60 02/27/2017 0519     Assessment/Plan: 1 Day Post-Op   Principal Problem:   Osteoarthritis of left knee   WBAT with walker DVT ppx: ASA, SCDS, TEDS PO pain control PT/OT Dipso: D/C home, start outpatient PT on Monday   Elie Goody 02/27/2017, 9:21 AM   Rod Can, MD Cell [phone]

## 2017-02-27 NOTE — Discharge Summary (Signed)
Physician Discharge Summary  Patient ID: Jerome Stark MRN: [id number] DOB/AGE: [birth year] 71 y.o.  Admit date: 02/26/2017 Discharge date: 02/27/2017  Admission Diagnoses:  Osteoarthritis of left knee  Discharge Diagnoses:  Principal Problem:   Osteoarthritis of left knee   Past Medical History:  Diagnosis Date  . Hyperlipidemia   . Hypertension   . Hypothyroidism   . 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 Ulcerative colitis ([place])     Surgeries: Procedure(s): LEFT TOTAL KNEE ARTHROPLASTY on 02/26/2017   Consultants (if any):   Discharged Condition: Improved  Hospital Course: Jerome Stark is an 71 y.o. male who was admitted 02/26/2017 with a diagnosis of Osteoarthritis of left knee and went to the operating room on 02/26/2017 and underwent the above named procedures.    He was given perioperative antibiotics:  Anti-infectives    Start     Dose/Rate Route Frequency Ordered Stop   02/26/17 2200  ceFAZolin (ANCEF) IVPB 2g/100 mL premix     2 g 200 mL/hr over 30 Minutes Intravenous Every 6 hours 02/26/17 1842 02/27/17 0526   02/26/17 1345  ceFAZolin (ANCEF) IVPB 2g/100 mL premix     2 g 200 mL/hr over 30 Minutes Intravenous On call to O.R. 02/26/17 1335 02/26/17 1517    .  He was given sequential compression devices, early ambulation, and ASA for DVT prophylaxis.  He benefited maximally from the hospital stay and there were no complications.    Recent vital signs:  Vitals:   02/27/17 0212 02/27/17 0524  BP: 130/69 131/75  Pulse: 72 83  Resp: 16 16  Temp: 100.3 F (37.9 C) 98.4 F (36.9 C)    Recent laboratory studies:  Lab Results  Component Value Date   HGB 12.3 (L) 02/27/2017   HGB 14.5 02/20/2017   HGB 12.8 (L) 01/09/2017   Lab Results  Component Value Date   WBC 13.5 (H) 02/27/2017   PLT 217 02/27/2017   No results found for: INR Lab Results  Component Value Date   NA 137 02/27/2017   K 3.7 02/27/2017   CL 107 02/27/2017   CO2 23 02/27/2017   BUN 13  02/27/2017   CREATININE 0.86 02/27/2017   GLUCOSE 229 (H) 02/27/2017    Discharge Medications:   Allergies as of 02/27/2017   No Known Allergies     Medication List    STOP taking these medications   methocarbamol 500 MG tablet Commonly known as:  ROBAXIN     TAKE these medications   amLODipine 10 MG tablet Commonly known as:  NORVASC Take 10 mg by mouth every morning.   aspirin 81 MG chewable tablet Chew 1 tablet (81 mg total) by mouth 2 (two) times daily.   docusate sodium 100 MG capsule Commonly known as:  COLACE Take 1 capsule (100 mg total) by mouth 2 (two) times daily.   FISH OIL PO Take 2 capsules by mouth daily.   hydrochlorothiazide 25 MG tablet Commonly known as:  HYDRODIURIL Take 25 mg by mouth daily.   HYDROcodone-acetaminophen 5-325 MG tablet Commonly known as:  NORCO/VICODIN Take 1-2 tablets by mouth every 4 (four) hours as needed (breakthrough pain).   levothyroxine 75 MCG tablet Commonly known as:  SYNTHROID, LEVOTHROID Take 75 mcg by mouth daily before breakfast.   mesalamine 1.2 g EC tablet Commonly known as:  LIALDA Take 4 tablets (4.8 g total) by mouth daily with breakfast.   ondansetron 4 MG tablet Commonly known as:  ZOFRAN Take 1  tablet (4 mg total) by mouth every 6 (six) hours as needed for nausea.   QC LORATADINE ALLERGY RELIEF 10 MG tablet Generic drug:  loratadine Take 10 mg by mouth daily as needed for allergies.   senna 8.6 MG Tabs tablet Commonly known as:  SENOKOT Take 2 tablets (17.2 mg total) by mouth at bedtime.   Vitamin D 2000 units tablet Take 2,000 Units by mouth daily.       Diagnostic Studies: Dg Knee Left Port  Result Date: 02/26/2017 CLINICAL DATA:  Status post knee arthroplasty. EXAM: PORTABLE LEFT KNEE - 1-2 VIEW COMPARISON:  None. FINDINGS: Status post recent 3 component knee arthroplasty with normal alignment of the orthopedic hardware. Soft tissue emphysema, expected postsurgical finding. No evidence of  immediate complications. IMPRESSION: Status post recent 3 component total left knee arthroplasty, without evidence of immediate complications. Electronically Signed   By: Fidela Salisbury M.D.   On: 02/26/2017 18:25    Disposition: 01-Home or Self Care  Discharge Instructions    Call MD / Call 911    Complete by:  As directed    If you experience chest pain or shortness of breath, CALL 911 and be transported to the hospital emergency room.  If you develope a fever above 101 F, pus (white drainage) or increased drainage or redness at the wound, or calf pain, call your surgeon's office.   Constipation Prevention    Complete by:  As directed    Drink plenty of fluids.  Prune juice may be helpful.  You may use a stool softener, such as Colace (over the counter) 100 mg twice a day.  Use MiraLax (over the counter) for constipation as needed.   Diet - low sodium heart healthy    Complete by:  As directed    Do not put a pillow under the knee. Place it under the heel.    Complete by:  As directed    Driving restrictions    Complete by:  As directed    No driving for 6 weeks   Increase activity slowly as tolerated    Complete by:  As directed    Lifting restrictions    Complete by:  As directed    No lifting for 6 weeks   TED hose    Complete by:  As directed    Use stockings (TED hose) for 2 weeks on both leg(s).  You may remove them at night for sleeping.      Follow-up Information    Leshia Kope, Aaron Edelman, MD. Schedule an appointment as soon as possible for a visit in 2 weeks.   Specialty:  Orthopedic Surgery Why:  For wound re-check Contact information: [address] [address] [phone]            Signed: Elie Goody 02/27/2017, 9:24 AM

## 2017-02-27 NOTE — Care Management Note (Signed)
Case Management Note  Patient Details  Name: Jerome Stark MRN: [id number] Date of Birth: 07-15-1946  Subjective/Objective:  71 y/o m admitted w/OA l knee. Fromhome. Per surgeon recc-otpt PT. Has dme prior to admission. No further CM eeds.                  Action/Plan:d/c home.   Expected Discharge Date:  02/27/17               Expected Discharge Plan:  OP Rehab  In-House Referral:     Discharge planning Services  CM Consult  Post Acute Care Choice:  Durable Medical Equipment (has rw,3n1) Choice offered to:     DME Arranged:    DME Agency:     HH Arranged:    HH Agency:     Status of Service:  Completed, signed off  If discussed at [employer], dates discussed:    Additional Comments:  Dessa Phi, RN 02/27/2017, 9:28 AM

## 2017-02-27 NOTE — Evaluation (Signed)
Physical Therapy Evaluation Patient Details Name: Jerome Stark MRN: [id number] DOB: 09-10-1945 Today's Date: 02/27/2017   History of Present Illness  Pt s/p L TKR and with hx of recent R TKR  Clinical Impression  Pt s/p L TKR and presents with decreased L LE strength/ROM and post op pain limiting functional mobility.  Pt should progress to dc home with family assist.    Follow Up Recommendations DC plan and follow up therapy as arranged by surgeon    Equipment Recommendations  None recommended by PT    Recommendations for Other Services       Precautions / Restrictions Precautions Precautions: Knee;Fall Restrictions Weight Bearing Restrictions: No Other Position/Activity Restrictions: WBAT      Mobility  Bed Mobility Overal bed mobility: Needs Assistance Bed Mobility: Supine to Sit     Supine to sit: Min guard     General bed mobility comments: min cues for sequence  Transfers Overall transfer level: Needs assistance Equipment used: Rolling walker (2 wheeled) Transfers: Sit to/from Stand Sit to Stand: Min guard         General transfer comment: cues for LE management and use of UEs to self assist  Ambulation/Gait Ambulation/Gait assistance: Min guard Ambulation Distance (Feet): 147 Feet Assistive device: Rolling walker (2 wheeled) Gait Pattern/deviations: Step-to pattern;Step-through pattern;Decreased step length - right;Decreased step length - left;Shuffle;Trunk flexed Gait velocity: decr Gait velocity interpretation: Below normal speed for age/gender General Gait Details: cues for posture, position from RW and initial sequence  Stairs            Wheelchair Mobility    Modified Rankin (Stroke Patients Only)       Balance                                             Pertinent Vitals/Pain Pain Assessment: 0-10 Pain Score: 4  Pain Location: L knee Pain Descriptors / Indicators: Aching;Sore Pain Intervention(s): Limited  activity within patient's tolerance;Monitored during session;Premedicated before session;Ice applied    Home Living Family/patient expects to be discharged to:: Private residence Living Arrangements: Spouse/significant other Available Help at Discharge: Family;Available 24 hours/day Type of Home: House Home Access: Stairs to enter Entrance Stairs-Rails: None Entrance Stairs-Number of Steps: 2 Home Layout: One level Home Equipment: Walker - 2 wheels;Cane - single point;Bedside commode;Crutches      Prior Function Level of Independence: Independent               Hand Dominance   Dominant Hand: Right    Extremity/Trunk Assessment   Upper Extremity Assessment Upper Extremity Assessment: Overall WFL for tasks assessed    Lower Extremity Assessment Lower Extremity Assessment: LLE deficits/detail LLE Deficits / Details: 3/5 quads with IND SLR and -10 - 100 AAROM at knee       Communication   Communication: No difficulties  Cognition Arousal/Alertness: Awake/alert Behavior During Therapy: WFL for tasks assessed/performed Overall Cognitive Status: Within Functional Limits for tasks assessed                                        General Comments      Exercises Total Joint Exercises Ankle Circles/Pumps: AROM;Both;15 reps;Supine Quad Sets: AROM;Both;10 reps;Supine Heel Slides: AAROM;Left;15 reps;Supine Straight Leg Raises: AAROM;AROM;Left;15 reps;Supine   Assessment/Plan  PT Assessment Patient needs continued PT services  PT Problem List Decreased strength;Decreased range of motion;Decreased activity tolerance;Decreased mobility;Decreased knowledge of use of DME;Pain       PT Treatment Interventions DME instruction;Gait training;Stair training;Functional mobility training;Therapeutic activities;Therapeutic exercise;Patient/family education    PT Goals (Current goals can be found in the Care Plan section)  Acute Rehab PT Goals Patient Stated  Goal: Regain IND PT Goal Formulation: With patient Time For Goal Achievement: 03/01/17 Potential to Achieve Goals: Good    Frequency 7X/week   Barriers to discharge        Co-evaluation               AM-PAC PT "6 Clicks" Daily Activity  Outcome Measure Difficulty turning over in bed (including adjusting bedclothes, sheets and blankets)?: A Little Difficulty moving from lying on back to sitting on the side of the bed? : A Little Difficulty sitting down on and standing up from a chair with arms (e.g., wheelchair, bedside commode, etc,.)?: A Little Help needed moving to and from a bed to chair (including a wheelchair)?: A Little Help needed walking in hospital room?: A Little Help needed climbing 3-5 steps with a railing? : A Little 6 Click Score: 18    End of Session Equipment Utilized During Treatment: Gait belt Activity Tolerance: Patient tolerated treatment well Patient left: in chair;with call bell/phone within reach;with family/visitor present Nurse Communication: Mobility status PT Visit Diagnosis: Difficulty in walking, not elsewhere classified (R26.2)    Time: 3567-0141 PT Time Calculation (min) (ACUTE ONLY): 27 min   Charges:   PT Evaluation $PT Eval Low Complexity: 1 Procedure PT Treatments $Therapeutic Exercise: 8-22 mins   PT G Codes:        Pg [phone]   Mahamud Metts 02/27/2017, 10:53 AM

## 2017-02-27 NOTE — Progress Notes (Signed)
   02/27/17 1000  PT Time Calculation  PT Start Time (ACUTE ONLY) 0806  PT Stop Time (ACUTE ONLY) 2080  PT Time Calculation (min) (ACUTE ONLY) 27 min  PT G-Codes **NOT FOR INPATIENT CLASS**  Functional Assessment Tool Used Clinical judgement  Functional Limitation Mobility: Walking and moving around  Mobility: Walking and Moving Around Current Status (E2336) CJ  Mobility: Walking and Moving Around Goal Status (P2244) CI  PT General Charges  $$ ACUTE PT VISIT 1 Procedure  PT Evaluation  $PT Eval Low Complexity 1 Procedure  PT Treatments  $Therapeutic Exercise 8-22 mins

## 2018-03-24 ENCOUNTER — Encounter: Payer: Self-pay | Admitting: Nurse Practitioner

## 2018-03-24 ENCOUNTER — Ambulatory Visit (INDEPENDENT_AMBULATORY_CARE_PROVIDER_SITE_OTHER): Payer: Medicare Other | Admitting: Nurse Practitioner

## 2018-03-24 VITALS — BP 123/75 | HR 73 | Temp 97.3°F | Ht 71.0 in | Wt 188.8 lb

## 2018-03-24 DIAGNOSIS — R109 Unspecified abdominal pain: Secondary | ICD-10-CM | POA: Insufficient documentation

## 2018-03-24 DIAGNOSIS — K51819 Other ulcerative colitis with unspecified complications: Secondary | ICD-10-CM

## 2018-03-24 DIAGNOSIS — R1030 Lower abdominal pain, unspecified: Secondary | ICD-10-CM | POA: Diagnosis not present

## 2018-03-24 NOTE — Progress Notes (Signed)
Referring Provider: Cleophas Dunker, MD Primary Care Physician:  Cleophas Dunker, MD Primary GI:  Dr. Gala Romney  Chief Complaint  Patient presents with  . Ulcerative Colitis    c/o "grumbling in tummy", very little pain if any, no nausea/no vomiting    HPI:   Jerome Stark is a 72 y.o. male who presents for open ulcerative colitis.  Patient has not been seen by our office since 12/04/2015.  Last visit he was doing much better compared to 2017 when he was having a flare which was treated with a steroid taper.  His symptoms completely resolved after 3 weeks.  No flare symptoms since.  Rare loose stools which he attributes to dietary intake.  No other GI concerns.  We requested records from the [place] including CBC, renal function, liver function.. Recommended follow-up in 6 months. He was a no show for his follow-up appointment.  Received labs from the [place] dated 07/01/2016 found CMP with normal kidney/liver function. CBC dated 06/23/2016 with leucocytosis 14.4, normal hgb. Labs in 04/12/15 with normal CBC.  Today he states he's doing well overall. Having some "gurgling in my stomach." Some minimal lower abdominal tenderness in the mornings only, occurs about every morning which he states is gas pain/need to have a bowel movement. After eating, 15-30 minutes after eating, has more rumbling and sometimes with the urge to have a bowel movement. Has a bowel movement 1-2 times a day, bristol 4, no straining. No diarrhea. Denies hematochezia, other abdominal pain, melena, fever, chills, unintentional weight loss. Denies chest pain, dyspnea, dizziness, lightheadedness, syncope, near syncope. Denies any other upper or lower GI symptoms.  States he just had blood drawn at the [place] a month ago and "everything was in the normal range."  Past Medical History:  Diagnosis Date  . Hyperlipidemia   . Hypertension   . Hypothyroidism   . 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 Ulcerative colitis [facility])     Past Surgical History:   Procedure Laterality Date  . BACK SURGERY     lower back / lumbar   . 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 COLONOSCOPY  March 2015   Dr. Britta Mccreedy: diminutive hyperplastic polyp. scope advanced to the cecum, no intubation of TI, no random biopsies performed  . COLONOSCOPY N/A 11/29/2014   Dr. Gala Romney: left-sided proctocolitis   . KNEE ARTHROPLASTY Right 01/08/2017   Procedure: RIGHT TOTAL KNEE ARTHROPLASTY WITH COMPUTER NAVIGATION;  Surgeon: Rod Can, MD;  Location: [facility];  Service: Orthopedics;  Laterality: Right;  Needs RNFA; Adductor block  . KNEE ARTHROPLASTY Left 02/26/2017   Procedure: LEFT TOTAL KNEE ARTHROPLASTY;  Surgeon: Rod Can, MD;  Location: [facility];  Service: Orthopedics;  Laterality: Left;  Needs RNFA  . LUNG SURGERY     collapsed lung; bilateral per patient "they called it a bleb that burst"     Current Outpatient Medications  Medication Sig Dispense Refill  . amLODipine (NORVASC) 10 MG tablet Take 10 mg by mouth every morning.  3  . Cholecalciferol (VITAMIN D) 2000 units tablet Take 2,000 Units by mouth daily.    . hydrochlorothiazide (HYDRODIURIL) 25 MG tablet Take 25 mg by mouth daily.  3  . levothyroxine (SYNTHROID, LEVOTHROID) 75 MCG tablet Take 75 mcg by mouth daily before breakfast.    . mesalamine (LIALDA) 1.2 G EC tablet Take 4 tablets (4.8 g total) by mouth daily with breakfast. 360 tablet 1   No current facility-administered medications  for this visit.     Allergies as of 03/24/2018  . (No Known Allergies)    Family History  Problem Relation Age of Onset  . Hypertension Mother   . Heart disease Mother   . Hypertension Other   . Heart attack Brother        coronary artery stent at age 50  . Heart attack Maternal Grandfather        died of MI in late 19s  . Colon cancer Neg Hx     Social History   Socioeconomic History  . Marital status: Married    Spouse name: Not on file  . Number of children: Not on file  . Years  of education: Not on file  . Highest education level: Not on file  Occupational History  . Not on file  Social Needs  . Financial resource strain: Not on file  . Food insecurity:    Worry: Not on file    Inability: Not on file  . Transportation needs:    Medical: Not on file    Non-medical: Not on file  Tobacco Use  . Smoking status: Former Smoker    Last attempt to quit: 02/14/1985    Years since quitting: 33.1  . Smokeless tobacco: Never Used  . Tobacco comment: smoked 1 ppd for 25 years, quit at age 48  Substance and Sexual Activity  . Alcohol use: Yes    Alcohol/week: 0.0 standard drinks    Comment: occasional wine  . Drug use: No  . Sexual activity: Not on file  Lifestyle  . Physical activity:    Days per week: Not on file    Minutes per session: Not on file  . Stress: Not on file  Relationships  . Social connections:    Talks on phone: Not on file    Gets together: Not on file    Attends religious service: Not on file    Active member of club or organization: Not on file    Attends meetings of clubs or organizations: Not on file    Relationship status: Not on file  Other Topics Concern  . Not on file  Social History Narrative  . Not on file    Review of Systems: General: Negative for anorexia, weight loss, fever, chills, fatigue, weakness. ENT: Negative for hoarseness, difficulty swallowing. CV: Negative for chest pain, angina, palpitations, peripheral edema.  Respiratory: Negative for dyspnea at rest, cough, sputum, wheezing.  GI: See history of present illness. Derm: Negative for rash or itching.  Endo: Negative for unusual weight change.  Heme: Negative for bruising or bleeding. Allergy: Negative for rash or hives.   Physical Exam: BP 123/75   Pulse 73   Temp (!) 97.3 F (36.3 C) (Oral)   Ht 5' 11"  (1.803 m)   Wt 188 lb 12.8 oz (85.6 kg)   BMI 26.33 kg/m  General:   Alert and oriented. Pleasant and cooperative. Well-nourished and well-developed.   Eyes:  Without icterus, sclera clear and conjunctiva pink.  Ears:  Normal auditory acuity. Cardiovascular:  S1, S2 present without murmurs appreciated. Extremities without clubbing or edema. Respiratory:  Clear to auscultation bilaterally. No wheezes, rales, or rhonchi. No distress.  Gastrointestinal:  +BS, soft, non-tender and non-distended. No HSM noted. No guarding or rebound. No masses appreciated.  Rectal:  Deferred  Musculoskalatal:  Symmetrical without gross deformities. Neurologic:  Alert and oriented x4;  grossly normal neurologically. Psych:  Alert and cooperative. Normal mood and affect. Heme/Lymph/Immune: No  excessive bruising noted.    03/24/2018 3:22 PM   Disclaimer: This note was dictated with voice recognition software. Similar sounding words can inadvertently be transcribed and may not be corrected upon review.

## 2018-03-24 NOTE — Assessment & Plan Note (Signed)
The patient describes morning times daily mild lower abdominal discomfort which he attributes to gas and need to have a bowel movement.  His pain improves after he does have a bowel movement and passes gas.  He also notes "more gurgling than normal in my stomach after I eat."  At this point due to vague GI symptoms including stomach gurgling, gas, mild discomfort I will start him on probiotics for 1 to 2 months.  Return for follow-up in 6 months.  He can call us with any recurrent problems, questions or concerns.

## 2018-03-24 NOTE — Patient Instructions (Signed)
1. Continue your current medications. 2. Bring your labs to our office so we can make a copy for our records. 3. Start taking probiotics.  Two brands we often recommend are [employer].  You can discuss with the pharmacist if you need further assistance. 4. Take probiotics for 1 to 2 months, based on the instructions on the particular type you choose. 5. Return for follow-up in 6 months. 6. Call us if you have any questions or concerns.  At [facility] we value your feedback. You may receive a survey about your visit today. Please share your experience as we strive to create trusting relationships with our patients to provide genuine, compassionate, quality care.  We appreciate your understanding and patience as we review any laboratory studies, imaging, and other diagnostic tests that are ordered as we care for you. Our office policy is 5 business days for review of these results, and any emergent or urgent results are addressed in a timely manner for your best interest. If you do not hear from our office in 1 week, please contact us.   We also encourage the use of MyChart, which contains your medical information for your review as well. If you are not enrolled in this feature, an access code is available on this after visit summary for your convenience. Thank you for allowing [place] to be involved in the care of you and your family.   It was great to see you today!  I hope you have a great summer!!

## 2018-03-24 NOTE — Assessment & Plan Note (Signed)
History of ulcerative colitis generally well controlled on Lialda.  No flare symptoms since we saw him in 2017.  He does complain of "a little more than normal grumbling after I eat."  He has daily lower abdominal mild discomfort which improves after a bowel movement.  General GI distress as discussed above.  At this point I will have him continue his current medications.  He just had labs drawn at the [place] a month ago and he will bring those by our office for scanning.  Colonoscopy up-to-date 2016.  Current recommendations include surveillance 8 years after diagnosis of disease.  He will be due for colonoscopy in 2024.  Sooner exam would likely be warranted with any change in symptoms.  Follow-up in 6 months.

## 2018-03-25 NOTE — Progress Notes (Signed)
CC'D TO PCP °

## 2018-09-27 ENCOUNTER — Ambulatory Visit: Payer: Medicare Other | Admitting: Nurse Practitioner

## 2018-09-27 ENCOUNTER — Encounter: Payer: Self-pay | Admitting: Nurse Practitioner

## 2018-09-27 VITALS — BP 137/79 | HR 78 | Temp 97.9°F | Ht 71.0 in | Wt 188.2 lb

## 2018-09-27 DIAGNOSIS — R1030 Lower abdominal pain, unspecified: Secondary | ICD-10-CM | POA: Diagnosis not present

## 2018-09-27 DIAGNOSIS — K51819 Other ulcerative colitis with unspecified complications: Secondary | ICD-10-CM

## 2018-09-27 LAB — CBC WITH DIFFERENTIAL/PLATELET
ABSOLUTE MONOCYTES: 783 {cells}/uL (ref 200–950)
BASOS PCT: 0.6 %
Basophils Absolute: 52 cells/uL (ref 0–200)
EOS ABS: 120 {cells}/uL (ref 15–500)
Eosinophils Relative: 1.4 %
HCT: 42.6 % (ref 38.5–50.0)
Hemoglobin: 14.7 g/dL (ref 13.2–17.1)
Lymphs Abs: 2726 cells/uL (ref 850–3900)
MCH: 29.9 pg (ref 27.0–33.0)
MCHC: 34.5 g/dL (ref 32.0–36.0)
MCV: 86.6 fL (ref 80.0–100.0)
MPV: 10.5 fL (ref 7.5–12.5)
Monocytes Relative: 9.1 %
NEUTROS PCT: 57.2 %
Neutro Abs: 4919 cells/uL (ref 1500–7800)
PLATELETS: 261 10*3/uL (ref 140–400)
RBC: 4.92 10*6/uL (ref 4.20–5.80)
RDW: 14.8 % (ref 11.0–15.0)
TOTAL LYMPHOCYTE: 31.7 %
WBC: 8.6 10*3/uL (ref 3.8–10.8)

## 2018-09-27 LAB — BASIC METABOLIC PANEL
BUN: 13 mg/dL (ref 7–25)
CHLORIDE: 104 mmol/L (ref 98–110)
CO2: 27 mmol/L (ref 20–32)
Calcium: 8.7 mg/dL (ref 8.6–10.3)
Creat: 1.05 mg/dL (ref 0.70–1.18)
Glucose, Bld: 97 mg/dL (ref 65–139)
Potassium: 4 mmol/L (ref 3.5–5.3)
Sodium: 140 mmol/L (ref 135–146)

## 2018-09-27 NOTE — Patient Instructions (Signed)
Your health issues we discussed today were:   Ulcerative colitis: 1. I am glad you continue to do well on your medications 2. Call us if you have any worsening symptoms 3. Have your labs completed when you are able to to check your blood cell counts and your kidney function 4. Continue your current medications  Overall I recommend:  1. Follow-up in 6 months 2. Call us if you have any questions or concerns.  At [facility] we value your feedback. You may receive a survey about your visit today. Please share your experience as we strive to create trusting relationships with our patients to provide genuine, compassionate, quality care.  We appreciate your understanding and patience as we review any laboratory studies, imaging, and other diagnostic tests that are ordered as we care for you. Our office policy is 5 business days for review of these results, and any emergent or urgent results are addressed in a timely manner for your best interest. If you do not hear from our office in 1 week, please contact us.   We also encourage the use of MyChart, which contains your medical information for your review as well. If you are not enrolled in this feature, an access code is on this after visit summary for your convenience. Thank you for allowing [place] to be involved in your care.  It was great to see you today!  I hope you have a great day!!

## 2018-09-27 NOTE — Assessment & Plan Note (Signed)
History of ulcerative colitis and has done quite well historically on mesalamine (Lialda) 4.8 g a day.  No worsening symptoms.  Denies abdominal pain, diarrhea, rectal bleeding.  Recommend he continue his current medications.  His labs have historically been normal related to a CBC and kidney function.  We will recheck his labs today for every 76-monthmonitoring.  Follow-up in 6 months.

## 2018-09-27 NOTE — Assessment & Plan Note (Signed)
No current or active abdominal pain.  Continue to monitor and follow-up in 6 months for routine UC visit.

## 2018-09-27 NOTE — Progress Notes (Signed)
Referring Provider: Cleophas Dunker, MD Primary Care Physician:  Cleophas Dunker, MD Primary GI:  Dr. Gala Romney  Chief Complaint  Patient presents with  . Ulcerative Colitis    has a lot of rumbling after eating, no pain, anal itching    HPI:   Jerome Stark is a 73 y.o. male who presents for follow-up on ulcerative colitis and abdominal pain.  The patient was last seen in our office 03/24/2018 for the same.  No flare symptoms since 2017.  Records requested from the [place] with labs dated 07/01/2016 which found CMP with normal kidney/liver function, leukocytosis with white blood cell count of 14.4, normal hemoglobin.  Labs prior to that in 2016 with a normal CBC.  At his last visit he was doing okay overall and noted some "gurgling in my stomach."  With minimal lower abdominal tenderness in the mornings only and describes as gas pain/need to have a bowel movement.  Postprandially more rumbling and urge for bowel movement.  Has 1-2 stools a day, [place] 4 no straining no diarrhea.  No other GI complaints.  States he just had blood drawn at the [place] a month ago and "everything was normal."  At his last office visit he was taking Lialda (mesalamine) 4.8 g daily.  Recommended continue current medications, bring recent lab records to our office, start probiotics for 1 to 2 months, follow-up in 6 months.  Today he states he's doing well overall. Denies abdominal pain, N/V, hematochezia, melena, fever, chills, unintentional weight loss. Has had some intermittent, mild constipation in the form of sensation of incomplete emptying (though no hard stools or straining). When he wipes, his rectum it itchy but no blood; this has been occurring for the last 6-8 weeks, has not tried any OTC medications. Will use Tinactin on other skin surfaces for intermittent rash. Has occasional Eczema flares which he has topical medication that is effective. Denies chest pain, dyspnea, dizziness, lightheadedness, syncope, near syncope.  Denies any other upper or lower GI symptoms.  Past Medical History:  Diagnosis Date  . Hyperlipidemia   . Hypertension   . Hypothyroidism   . 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 Ulcerative colitis [facility])     Past Surgical History:  Procedure Laterality Date  . BACK SURGERY     lower back / lumbar   . 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 COLONOSCOPY  March 2015   Dr. Britta Mccreedy: diminutive hyperplastic polyp. scope advanced to the cecum, no intubation of TI, no random biopsies performed  . COLONOSCOPY N/A 11/29/2014   Dr. Gala Romney: left-sided proctocolitis   . KNEE ARTHROPLASTY Right 01/08/2017   Procedure: RIGHT TOTAL KNEE ARTHROPLASTY WITH COMPUTER NAVIGATION;  Surgeon: Rod Can, MD;  Location: [facility];  Service: Orthopedics;  Laterality: Right;  Needs RNFA; Adductor block  . KNEE ARTHROPLASTY Left 02/26/2017   Procedure: LEFT TOTAL KNEE ARTHROPLASTY;  Surgeon: Rod Can, MD;  Location: [facility];  Service: Orthopedics;  Laterality: Left;  Needs RNFA  . LUNG SURGERY     collapsed lung; bilateral per patient "they called it a bleb that burst"     Current Outpatient Medications  Medication Sig Dispense Refill  . amLODipine (NORVASC) 10 MG tablet Take 10 mg by mouth every morning.  3  . Cholecalciferol (VITAMIN D) 2000 units tablet Take 2,000 Units by mouth daily.    . hydrochlorothiazide (HYDRODIURIL) 25 MG tablet Take 25 mg by mouth daily.  3  . levothyroxine (  SYNTHROID, LEVOTHROID) 75 MCG tablet Take 75 mcg by mouth daily before breakfast.    . mesalamine (LIALDA) 1.2 G EC tablet Take 4 tablets (4.8 g total) by mouth daily with breakfast. 360 tablet 1   No current facility-administered medications for this visit.     Allergies as of 09/27/2018  . (No Known Allergies)    Family History  Problem Relation Age of Onset  . Hypertension Mother   . Heart disease Mother   . Hypertension Other   . Heart attack Brother        coronary artery stent at age 92  .  Heart attack Maternal Grandfather        died of MI in late 85s  . Colon cancer Neg Hx     Social History   Socioeconomic History  . Marital status: Married    Spouse name: Not on file  . Number of children: Not on file  . Years of education: Not on file  . Highest education level: Not on file  Occupational History  . Not on file  Social Needs  . Financial resource strain: Not on file  . Food insecurity:    Worry: Not on file    Inability: Not on file  . Transportation needs:    Medical: Not on file    Non-medical: Not on file  Tobacco Use  . Smoking status: Former Smoker    Last attempt to quit: 02/14/1985    Years since quitting: 33.6  . Smokeless tobacco: Never Used  . Tobacco comment: smoked 1 ppd for 25 years, quit at age 35  Substance and Sexual Activity  . Alcohol use: Yes    Alcohol/week: 0.0 standard drinks    Comment: occasional wine  . Drug use: No  . Sexual activity: Not on file  Lifestyle  . Physical activity:    Days per week: Not on file    Minutes per session: Not on file  . Stress: Not on file  Relationships  . Social connections:    Talks on phone: Not on file    Gets together: Not on file    Attends religious service: Not on file    Active member of club or organization: Not on file    Attends meetings of clubs or organizations: Not on file    Relationship status: Not on file  Other Topics Concern  . Not on file  Social History Narrative  . Not on file    Review of Systems: General: Negative for anorexia, weight loss, fever, chills, fatigue, weakness. ENT: Negative for hoarseness, difficulty swallowing. CV: Negative for chest pain, angina, palpitations, peripheral edema.  Respiratory: Negative for dyspnea at rest, cough, sputum, wheezing.  GI: See history of present illness. Endo: Negative for unusual weight change.  Heme: Negative for bruising or bleeding. Allergy: intermittent mild eczema treated adequately with topical  therapy.   Physical Exam: BP 137/79   Pulse 78   Temp 97.9 F (36.6 C) (Oral)   Ht 5' 11"  (1.803 m)   Wt 188 lb 3.2 oz (85.4 kg)   BMI 26.25 kg/m  General:   Alert and oriented. Pleasant and cooperative. Well-nourished and well-developed.  Head:  Normocephalic and atraumatic. Eyes:  Without icterus, sclera clear and conjunctiva pink.  Ears:  Normal auditory acuity. Cardiovascular:  S1, S2 present without murmurs appreciated. Extremities without clubbing or edema. Respiratory:  Clear to auscultation bilaterally. No wheezes, rales, or rhonchi. No distress.  Gastrointestinal:  +BS, soft, non-tender and  non-distended. No HSM noted. No guarding or rebound. No masses appreciated.  Rectal:  Deferred  Musculoskalatal:  Symmetrical without gross deformities. Skin:  Intact without significant lesions or rashes. Neurologic:  Alert and oriented x4;  grossly normal neurologically. Psych:  Alert and cooperative. Normal mood and affect. Heme/Lymph/Immune: No excessive bruising noted.    09/27/2018 10:42 AM   Disclaimer: This note was dictated with voice recognition software. Similar sounding words can inadvertently be transcribed and may not be corrected upon review.

## 2018-09-27 NOTE — Progress Notes (Signed)
cc'd to pcp 

## 2019-03-30 ENCOUNTER — Ambulatory Visit: Payer: Non-veteran care | Admitting: Nurse Practitioner

## 2019-03-30 NOTE — Progress Notes (Deleted)
Referring Provider: Cleophas Dunker, MD Primary Care Physician:  Cleophas Dunker, MD Primary GI:  Dr. Gala Romney  No chief complaint on file.   HPI:   Jerome Stark is a 73 y.o. male who presents for follow-up on abdominal pain.  The patient was last seen in our office 09/27/2018 for ulcerative colitis and lower abdominal pain.  He has not had a flare since 2017.  Previously described lower abdominal discomfort with sensation of needing to go to the bathroom/gas pain.  Previously done well on Lialda (mesalamine) 4.8 g daily.  At his last visit no overt GI symptoms.  Has had some intermittent, mild constipation in the form of sensation of incomplete emptying though without hard stools or straining.  Rectal itching but no blood and has not tried any over-the-counter hemorrhoid creams.  Typically uses Tinactin on other skin surfaces for intermittent rash.  Occasional eczema flares with a topical medication that is effective.  No other overt GI symptoms.  At his last visit he was still on mesalamine (Lialda) 4.8 g daily for control of his ulcerative colitis.  Recommended he continue his medications, call us for any flare symptoms, [facility], follow-up in 6 months.  Today he states   Past Medical History:  Diagnosis Date  . Hyperlipidemia   . Hypertension   . Hypothyroidism   . 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 Ulcerative colitis [facility])     Past Surgical History:  Procedure Laterality Date  . BACK SURGERY     lower back / lumbar   . 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 COLONOSCOPY  March 2015   Dr. Britta Mccreedy: diminutive hyperplastic polyp. scope advanced to the cecum, no intubation of TI, no random biopsies performed  . COLONOSCOPY N/A 11/29/2014   Dr. Gala Romney: left-sided proctocolitis   . KNEE ARTHROPLASTY Right 01/08/2017   Procedure: RIGHT TOTAL KNEE ARTHROPLASTY WITH COMPUTER NAVIGATION;  Surgeon: Rod Can, MD;  Location: [facility];  Service:  Orthopedics;  Laterality: Right;  Needs RNFA; Adductor block  . KNEE ARTHROPLASTY Left 02/26/2017   Procedure: LEFT TOTAL KNEE ARTHROPLASTY;  Surgeon: Rod Can, MD;  Location: [facility];  Service: Orthopedics;  Laterality: Left;  Needs RNFA  . LUNG SURGERY     collapsed lung; bilateral per patient "they called it a bleb that burst"     Current Outpatient Medications  Medication Sig Dispense Refill  . amLODipine (NORVASC) 10 MG tablet Take 10 mg by mouth every morning.  3  . Cholecalciferol (VITAMIN D) 2000 units tablet Take 2,000 Units by mouth daily.    . hydrochlorothiazide (HYDRODIURIL) 25 MG tablet Take 25 mg by mouth daily.  3  . levothyroxine (SYNTHROID, LEVOTHROID) 75 MCG tablet Take 75 mcg by mouth daily before breakfast.    . mesalamine (LIALDA) 1.2 G EC tablet Take 4 tablets (4.8 g total) by mouth daily with breakfast. 360 tablet 1   No current facility-administered medications for this visit.     Allergies as of 03/30/2019  . (No Known Allergies)    Family History  Problem Relation Age of Onset  . Hypertension Mother   . Heart disease Mother   . Hypertension Other   . Heart attack Brother        coronary artery stent at age 40  . Heart attack Maternal Grandfather        died of MI in late 4s  . Colon cancer Neg Hx  Social History   Socioeconomic History  . Marital status: Married    Spouse name: Not on file  . Number of children: Not on file  . Years of education: Not on file  . Highest education level: Not on file  Occupational History  . Not on file  Social Needs  . Financial resource strain: Not on file  . Food insecurity    Worry: Not on file    Inability: Not on file  . Transportation needs    Medical: Not on file    Non-medical: Not on file  Tobacco Use  . Smoking status: Former Smoker    Quit date: 02/14/1985    Years since quitting: 34.1  . Smokeless tobacco: Never Used  . Tobacco comment: smoked 1 ppd for 25 years, quit at age 25   Substance and Sexual Activity  . Alcohol use: Yes    Alcohol/week: 0.0 standard drinks    Comment: occasional wine  . Drug use: No  . Sexual activity: Not on file  Lifestyle  . Physical activity    Days per week: Not on file    Minutes per session: Not on file  . Stress: Not on file  Relationships  . Social Herbalist on phone: Not on file    Gets together: Not on file    Attends religious service: Not on file    Active member of club or organization: Not on file    Attends meetings of clubs or organizations: Not on file    Relationship status: Not on file  Other Topics Concern  . Not on file  Social History Narrative  . Not on file    Review of Systems: General: Negative for anorexia, weight loss, fever, chills, fatigue, weakness. Eyes: Negative for vision changes.  ENT: Negative for hoarseness, difficulty swallowing , nasal congestion. CV: Negative for chest pain, angina, palpitations, dyspnea on exertion, peripheral edema.  Respiratory: Negative for dyspnea at rest, dyspnea on exertion, cough, sputum, wheezing.  GI: See history of present illness. GU:  Negative for dysuria, hematuria, urinary incontinence, urinary frequency, nocturnal urination.  MS: Negative for joint pain, low back pain.  Derm: Negative for rash or itching.  Neuro: Negative for weakness, abnormal sensation, seizure, frequent headaches, memory loss, confusion.  Psych: Negative for anxiety, depression, suicidal ideation, hallucinations.  Endo: Negative for unusual weight change.  Heme: Negative for bruising or bleeding. Allergy: Negative for rash or hives.   Physical Exam: There were no vitals taken for this visit. General:   Alert and oriented. Pleasant and cooperative. Well-nourished and well-developed.  Head:  Normocephalic and atraumatic. Eyes:  Without icterus, sclera clear and conjunctiva pink.  Ears:  Normal auditory acuity. Mouth:  No deformity or lesions, oral mucosa pink.   Throat/Neck:  Supple, without mass or thyromegaly. Cardiovascular:  S1, S2 present without murmurs appreciated. Normal pulses noted. Extremities without clubbing or edema. Respiratory:  Clear to auscultation bilaterally. No wheezes, rales, or rhonchi. No distress.  Gastrointestinal:  +BS, soft, non-tender and non-distended. No HSM noted. No guarding or rebound. No masses appreciated.  Rectal:  Deferred  Musculoskalatal:  Symmetrical without gross deformities. Normal posture. Skin:  Intact without significant lesions or rashes. Neurologic:  Alert and oriented x4;  grossly normal neurologically. Psych:  Alert and cooperative. Normal mood and affect. Heme/Lymph/Immune: No significant cervical adenopathy. No excessive bruising noted.    03/30/2019 7:52 AM   Disclaimer: This note was dictated with voice recognition software. Similar sounding words  can inadvertently be transcribed and may not be corrected upon review.

## 2019-04-26 ENCOUNTER — Ambulatory Visit: Payer: Medicare Other | Admitting: Nurse Practitioner

## 2019-04-26 ENCOUNTER — Other Ambulatory Visit: Payer: Self-pay

## 2019-04-26 ENCOUNTER — Encounter: Payer: Self-pay | Admitting: Nurse Practitioner

## 2019-04-26 VITALS — BP 147/86 | HR 72 | Temp 96.8°F | Ht 71.0 in | Wt 188.6 lb

## 2019-04-26 DIAGNOSIS — R1030 Lower abdominal pain, unspecified: Secondary | ICD-10-CM | POA: Diagnosis not present

## 2019-04-26 DIAGNOSIS — K51819 Other ulcerative colitis with unspecified complications: Secondary | ICD-10-CM | POA: Diagnosis not present

## 2019-04-26 NOTE — Assessment & Plan Note (Signed)
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  He has done quite well on Lialda 4.8 g daily.  His labs have been normal.  He recently had labs completed at the [facility] and we will request these for his updated lab information.  Recommend continue current medications.  Follow-up in 6 months.  Call us if any worsening or significant symptoms.

## 2019-04-26 NOTE — Progress Notes (Signed)
Referring Provider: Cleophas Dunker, MD Primary Care Physician:  Cleophas Dunker, MD Primary GI:  Dr. Gala Romney  Chief Complaint  Patient presents with  . Ulcerative Colitis    f/u, doing ok    HPI:   Jerome Stark is a 73 y.o. male who presents for follow-up.  The patient was last seen in our office 09/27/2018 for ulcerative colitis and lower abdominal pain.  His primary complaint has been "stomach rumbling" or "gurgling".  Has previously done well on Lialda (mesalamine) 4.8 g daily.  At his last visit he was doing well, no severe GI symptoms.  Has had intermittent mild constipation in the form of incomplete emptying, although no hard stools or straining.  Also with rectal itching but without blood.  Has not really tried any over-the-counter medications at that point.  No other GI complaints.  He has co-commitment eczema which is well managed on medications.  Recommended update labs, call for any worsening symptoms, follow-up in 6 months.  Labs completed 09/27/2018 which found normal CBC and normal CMP.  Okay to continue current medications.  Follow-up in 6 months as previously recommended.  Review of current guidelines suggest surveillance colonoscopy 8 years after IBD diagnosis (2024).  Today he states he's doing well overall. Still on Lialda. Sometimes has the urge to have a bowel movement after eating. Denies ongoing abdominal pain, N/V, hematochezia, melena, fever, chills, unintentional weight loss. Eczema doing well. No new diagnosis. He did have a cold about a month ago that everyone in the home had; one person tested for COVID-19 and was negative. Symptoms promptly resolved. Otherwise, denies URI or flu-like symptoms. Denies loss of sense of taste or smell. Denies chest pain, dyspnea, dizziness, lightheadedness, syncope, near syncope. Denies any other upper or lower GI symptoms.  Past Medical History:  Diagnosis Date  . Hyperlipidemia   . Hypertension   . Hypothyroidism   .  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 Ulcerative colitis [facility])     Past Surgical History:  Procedure Laterality Date  . BACK SURGERY     lower back / lumbar   . 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 COLONOSCOPY  March 2015   Dr. Britta Mccreedy: diminutive hyperplastic polyp. scope advanced to the cecum, no intubation of TI, no random biopsies performed  . COLONOSCOPY N/A 11/29/2014   Dr. Gala Romney: left-sided proctocolitis   . KNEE ARTHROPLASTY Right 01/08/2017   Procedure: RIGHT TOTAL KNEE ARTHROPLASTY WITH COMPUTER NAVIGATION;  Surgeon: Rod Can, MD;  Location: [facility];  Service: Orthopedics;  Laterality: Right;  Needs RNFA; Adductor block  . KNEE ARTHROPLASTY Left 02/26/2017   Procedure: LEFT TOTAL KNEE ARTHROPLASTY;  Surgeon: Rod Can, MD;  Location: [facility];  Service: Orthopedics;  Laterality: Left;  Needs RNFA  . LUNG SURGERY     collapsed lung; bilateral per patient "they called it a bleb that burst"     Current Outpatient Medications  Medication Sig Dispense Refill  . amLODipine (NORVASC) 10 MG tablet Take 10 mg by mouth every morning.  3  . Cholecalciferol (VITAMIN D) 2000 units tablet Take 2,000 Units by mouth daily.    . hydrochlorothiazide (HYDRODIURIL) 25 MG tablet Take 25 mg by mouth daily.  3  . levothyroxine (SYNTHROID, LEVOTHROID) 75 MCG tablet Take 75 mcg by mouth daily before breakfast.    . mesalamine (LIALDA) 1.2 G EC tablet Take 4 tablets (4.8 g total) by mouth daily with breakfast. 360 tablet 1  No current facility-administered medications for this visit.     Allergies as of 04/26/2019  . (No Known Allergies)    Family History  Problem Relation Age of Onset  . Hypertension Mother   . Heart disease Mother   . Hypertension Other   . Heart attack Brother        coronary artery stent at age 67  . Heart attack Maternal Grandfather        died of MI in late 36s  . Colon cancer Neg Hx     Social History   Socioeconomic History  . Marital  status: Married    Spouse name: Not on file  . Number of children: Not on file  . Years of education: Not on file  . Highest education level: Not on file  Occupational History  . Not on file  Social Needs  . Financial resource strain: Not on file  . Food insecurity    Worry: Not on file    Inability: Not on file  . Transportation needs    Medical: Not on file    Non-medical: Not on file  Tobacco Use  . Smoking status: Former Smoker    Quit date: 02/14/1985    Years since quitting: 34.2  . Smokeless tobacco: Never Used  . Tobacco comment: smoked 1 ppd for 25 years, quit at age 12  Substance and Sexual Activity  . Alcohol use: Yes    Alcohol/week: 0.0 standard drinks    Comment: occasional wine  . Drug use: No  . Sexual activity: Not on file  Lifestyle  . Physical activity    Days per week: Not on file    Minutes per session: Not on file  . Stress: Not on file  Relationships  . Social Herbalist on phone: Not on file    Gets together: Not on file    Attends religious service: Not on file    Active member of club or organization: Not on file    Attends meetings of clubs or organizations: Not on file    Relationship status: Not on file  Other Topics Concern  . Not on file  Social History Narrative  . Not on file    Review of Systems: General: Negative for anorexia, weight loss, fever, chills, fatigue, weakness. ENT: Negative for hoarseness, difficulty swallowing. CV: Negative for chest pain, angina, palpitations, peripheral edema.  Respiratory: Negative for dyspnea at rest, cough, sputum, wheezing.  GI: See history of present illness. Derm: Negative for rash or itching.  Endo: Negative for unusual weight change.  Heme: Negative for bruising or bleeding. Allergy: Negative for rash or hives.   Physical Exam: BP (!) 147/86   Pulse 72   Temp (!) 96.8 F (36 C) (Temporal)   Ht 5' 11"  (1.803 m)   Wt 188 lb 9.6 oz (85.5 kg)   BMI 26.30 kg/m  General:    Alert and oriented. Pleasant and cooperative. Well-nourished and well-developed.  Head:  Normocephalic and atraumatic. Eyes:  Without icterus, sclera clear and conjunctiva pink.  Ears:  Normal auditory acuity. Mouth:  No deformity or lesions, oral mucosa pink.  Throat/Neck:  Supple, without mass or thyromegaly. Cardiovascular:  S1, S2 present without murmurs appreciated. Normal pulses noted. Extremities without clubbing or edema. Respiratory:  Clear to auscultation bilaterally. No wheezes, rales, or rhonchi. No distress.  Gastrointestinal:  +BS, soft, non-tender and non-distended. No HSM noted. No guarding or rebound. No masses appreciated.  Rectal:  Deferred  Musculoskalatal:  Symmetrical without gross deformities. Normal posture. Skin:  Intact without significant lesions or rashes. Neurologic:  Alert and oriented x4;  grossly normal neurologically. Psych:  Alert and cooperative. Normal mood and affect. Heme/Lymph/Immune: No significant cervical adenopathy. No excessive bruising noted.    04/26/2019 10:49 AM   Disclaimer: This note was dictated with voice recognition software. Similar sounding words can inadvertently be transcribed and may not be corrected upon review.

## 2019-04-26 NOTE — Assessment & Plan Note (Signed)
Denies any ongoing abdominal pain.  Does have an occasional postprandial urge to have a bowel movement which is not distressing to him.  No other GI complaints.  Return for follow-up in 6 months.  Continue current medications.

## 2019-04-26 NOTE — Progress Notes (Signed)
cc'ed to pcp °

## 2019-04-26 NOTE — Patient Instructions (Signed)
Your health issues we discussed today were:   Ulcerative colitis: 1. I am glad you are still doing well! 2. Continue taking your current medications 3. We will request your recent labs from the [facility] 4. Call us if you have any worsening or recurrent symptoms  Overall I recommend:  1. Continue your other current medications 2. Return for follow-up in 6 months 3. Call us if you have any questions or concerns.   Because of recent events of COVID-19 ("Coronavirus"), follow CDC recommendations:  1. Wash your hand frequently 2. Avoid touching your face 3. Stay away from people who are sick 4. If you have symptoms such as fever, cough, shortness of breath then call your healthcare provider for further guidance 5. If you are sick, STAY AT HOME unless otherwise directed by your healthcare provider. 6. Follow directions from state and national officials regarding staying safe   At [facility] we value your feedback. You may receive a survey about your visit today. Please share your experience as we strive to create trusting relationships with our patients to provide genuine, compassionate, quality care.  We appreciate your understanding and patience as we review any laboratory studies, imaging, and other diagnostic tests that are ordered as we care for you. Our office policy is 5 business days for review of these results, and any emergent or urgent results are addressed in a timely manner for your best interest. If you do not hear from our office in 1 week, please contact us.   We also encourage the use of MyChart, which contains your medical information for your review as well. If you are not enrolled in this feature, an access code is on this after visit summary for your convenience. Thank you for allowing [place] to be involved in your care.  It was great to see you today!  I hope you have a great Fall!!

## 2019-08-12 ENCOUNTER — Other Ambulatory Visit: Payer: Self-pay

## 2019-08-12 ENCOUNTER — Ambulatory Visit: Payer: Non-veteran care | Attending: Internal Medicine

## 2019-08-12 DIAGNOSIS — Z20822 Contact with and (suspected) exposure to covid-19: Secondary | ICD-10-CM

## 2019-08-13 LAB — NOVEL CORONAVIRUS, NAA: SARS-CoV-2, NAA: DETECTED — AB

## 2019-08-14 ENCOUNTER — Telehealth: Payer: Self-pay | Admitting: Nurse Practitioner

## 2019-08-14 NOTE — Telephone Encounter (Signed)
Called to discuss with patient about Covid symptoms and the use of bamlanivimab, a monoclonal antibody infusion for those with mild to moderate Covid symptoms and at a high risk of hospitalization.  Pt is not qualified for this infusion at the [facility] infusion center at this time due to no qualifying symptoms.  He has no fever, cough, myalgia, diarrhea, sore throat, loss of smell, flu-like illness, or SOB.  Symptoms did start 2-3 days ago with mild congestion which is improving and tested positive 08/12/2019.  Advised him if worsening symptoms, and at < 10 day period of symptoms total, to notify PCP immediately who can reach out to hotline to notify team. IF he has any SOB or difficulty breathing he is to go straight to ER.  His wife is currently admitted to hospital with Covid.

## 2019-10-25 ENCOUNTER — Other Ambulatory Visit: Payer: Self-pay

## 2019-10-25 ENCOUNTER — Encounter: Payer: Self-pay | Admitting: Nurse Practitioner

## 2019-10-25 ENCOUNTER — Ambulatory Visit: Payer: Medicare Other | Admitting: Nurse Practitioner

## 2019-10-25 VITALS — BP 142/77 | HR 71 | Temp 97.1°F | Ht 71.0 in | Wt 194.2 lb

## 2019-10-25 DIAGNOSIS — R1030 Lower abdominal pain, unspecified: Secondary | ICD-10-CM

## 2019-10-25 DIAGNOSIS — K51819 Other ulcerative colitis with unspecified complications: Secondary | ICD-10-CM | POA: Diagnosis not present

## 2019-10-25 NOTE — Assessment & Plan Note (Signed)
No abdominal pain at this time in the setting of ulcerative colitis.  Recommend he continue to monitor and notify us of any recurrent pain.  Continue Lialda.  Follow-up in 6 months.

## 2019-10-25 NOTE — Assessment & Plan Note (Signed)
History of ulcerative colitis generally well managed on Lialda 4.8 g a day.  He does have some intermittent, additional cutaneous manifestations with eczema.  No overt joint pains, mouth sores.  Overall his disease seems well controlled.  I will put in for him to have CBC and c-Met checked as his routine.  He will have these drawn at the [place] and we will request them to fax results to [place].  Follow-up in 6 months.  Call for any questions or worsening symptoms.

## 2019-10-25 NOTE — Patient Instructions (Addendum)
Your health issues we discussed today were:   Ulcerative colitis: 1. Continue your current medications including Lialda 4.8 g daily. 2. Let us know if you have any worsening abdominal pain, diarrhea, rectal bleeding 3. [facility] 4. Have your labs completed when you are able to with the [place], please ask them to fax [place] the results  Overall I recommend:  1. Continue your other current medications 2. Return for follow-up in 6 months 3. Call us if you have any questions or concerns   ---------------------------------------------------------------  COVID-19 Vaccine Information can be found at: [email or web address].uk For questions related to vaccine distribution or appointments, please email [email or web address] or call [phone].   ---------------------------------------------------------------   At [facility] we value your feedback. You may receive a survey about your visit today. Please share your experience as we strive to create trusting relationships with our patients to provide genuine, compassionate, quality care.  We appreciate your understanding and patience as we review any laboratory studies, imaging, and other diagnostic tests that are ordered as we care for you. Our office policy is 5 business days for review of these results, and any emergent or urgent results are addressed in a timely manner for your best interest. If you do not hear from our office in 1 week, please contact us.   We also encourage the use of MyChart, which contains your medical information for your review as well. If you are not enrolled in this feature, an access code is on this after visit summary for your convenience. Thank you for allowing [place] to be involved in your care.  It was great to see you today!  I hope you have a great day!!

## 2019-10-25 NOTE — Addendum Note (Signed)
Addended by: Gordy Levan, Ketzaly Cardella A on: 10/25/2019 09:45 AM   Modules accepted: Orders

## 2019-10-25 NOTE — Progress Notes (Signed)
Referring Provider: Cleophas Dunker, MD Primary Care Physician:  Cleophas Dunker, MD Primary GI:  Dr. Gala Romney  Chief Complaint  Patient presents with  . Ulcerative Colitis    Doing well    HPI:   Jerome Stark is a 74 y.o. male who presents for follow-up on ulcerative colitis. The patient was last seen in our office 04/26/2019 for the same as well as lower abdominal pain. Noted history of ulcerative colitis previously done well on Lialda (mesalamine) 4.8 g daily. Chronic, persistent "stomach rumbling" or "gurgling". Previously his disease has been well managed. Due for repeat colonoscopy 8 years after IBD diagnosis (2024).  At his last visit doing well on Lialda, urged to have a bowel movement after eating. 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 Otherwise no other overt GI complaints. Recommended continue current medications, request recent labs from [facility], follow-up in 6 months.  No record of labs received from the [place]. No recent labs completed in our system.  Today he states he's doing ok overall. He states he had labs last time at the [place] and states everything was within normal ranges. Denies abdominal pain, N/V, hematochezia, melena, fever, chills, unintentional weight loss. Has occasion/rare left thumb and groin rash related to Eczema but has topical therapy for this. Denies significant chronic joint issues. Denies URI or flu-like symptoms. Denies loss of sense of taste or smell. He was tested for COVID-19 the first week in January and was positive; only symptoms were congestion. His wife was hospitalized but did well. His symptoms since have resolved. Denies chest pain, dyspnea, dizziness, lightheadedness, syncope, near syncope. Denies any other upper or lower GI symptoms.  Past Medical History:  Diagnosis Date  . Hyperlipidemia   . Hypertension   . Hypothyroidism   . 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 Ulcerative colitis [facility])     Past Surgical History:  Procedure Laterality  Date  . BACK SURGERY     lower back / lumbar   . 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 COLONOSCOPY  March 2015   Dr. Britta Mccreedy: diminutive hyperplastic polyp. scope advanced to the cecum, no intubation of TI, no random biopsies performed  . COLONOSCOPY N/A 11/29/2014   Dr. Gala Romney: left-sided proctocolitis   . KNEE ARTHROPLASTY Right 01/08/2017   Procedure: RIGHT TOTAL KNEE ARTHROPLASTY WITH COMPUTER NAVIGATION;  Surgeon: Rod Can, MD;  Location: [facility];  Service: Orthopedics;  Laterality: Right;  Needs RNFA; Adductor block  . KNEE ARTHROPLASTY Left 02/26/2017   Procedure: LEFT TOTAL KNEE ARTHROPLASTY;  Surgeon: Rod Can, MD;  Location: [facility];  Service: Orthopedics;  Laterality: Left;  Needs RNFA  . LUNG SURGERY     collapsed lung; bilateral per patient "they called it a bleb that burst"     Current Outpatient Medications  Medication Sig Dispense Refill  . amLODipine (NORVASC) 10 MG tablet Take 10 mg by mouth every morning.  3  . Cholecalciferol (VITAMIN D) 2000 units tablet Take 2,000 Units by mouth daily.    . hydrochlorothiazide (HYDRODIURIL) 25 MG tablet Take 25 mg by mouth daily.  3  . levothyroxine (SYNTHROID, LEVOTHROID) 75 MCG tablet Take 75 mcg by mouth daily before breakfast.    . mesalamine (LIALDA) 1.2 G EC tablet Take 4 tablets (4.8 g total) by mouth daily with breakfast. 360 tablet 1   No current facility-administered medications for this visit.    Allergies as of 10/25/2019  . (No Known Allergies)  Family History  Problem Relation Age of Onset  . Hypertension Mother   . Heart disease Mother   . Hypertension Other   . Heart attack Brother        coronary artery stent at age 34  . Heart attack Maternal Grandfather        died of MI in late 69s  . Colon cancer Neg Hx     Social History   Socioeconomic History  . Marital status: Married    Spouse name: Not on file  . Number of children: Not on file  . Years of education: Not on  file  . Highest education level: Not on file  Occupational History  . Not on file  Tobacco Use  . Smoking status: Former Smoker    Quit date: 02/14/1985    Years since quitting: 34.7  . Smokeless tobacco: Never Used  . Tobacco comment: smoked 1 ppd for 25 years, quit at age 25  Substance and Sexual Activity  . Alcohol use: Yes    Alcohol/week: 0.0 standard drinks    Comment: occasional wine  . Drug use: No  . Sexual activity: Not on file  Other Topics Concern  . Not on file  Social History Narrative  . Not on file   Social Determinants of Health   Financial Resource Strain:   . Difficulty of Paying Living Expenses:   Food Insecurity:   . Worried About Charity fundraiser in the Last Year:   . Arboriculturist in the Last Year:   Transportation Needs:   . Film/video editor (Medical):   Marland Kitchen Lack of Transportation (Non-Medical):   Physical Activity:   . Days of Exercise per Week:   . Minutes of Exercise per Session:   Stress:   . Feeling of Stress :   Social Connections:   . Frequency of Communication with Friends and Family:   . Frequency of Social Gatherings with Friends and Family:   . Attends Religious Services:   . Active Member of Clubs or Organizations:   . Attends Archivist Meetings:   Marland Kitchen Marital Status:     Review of Systems: Review of Systems  Constitutional: Negative for chills, fever, malaise/fatigue and weight loss.  HENT: Negative for congestion and sore throat.   Respiratory: Negative for cough and shortness of breath.   Cardiovascular: Negative for chest pain and palpitations.  Gastrointestinal: Negative for abdominal pain, blood in stool, diarrhea, melena, nausea and vomiting.  Musculoskeletal: Negative for joint pain and myalgias.  Skin: Positive for rash.       Intermittent eczema rash currently controlled on topical therapy.  Neurological: Negative for dizziness and weakness.  Endo/Heme/Allergies: Does not bruise/bleed easily.   Psychiatric/Behavioral: Negative for depression. The patient is not nervous/anxious.   All other systems reviewed and are negative.    VS: BP (!) 142/77   Pulse 71   Temp (!) 97.1 F (36.2 C) (Oral)   Ht 5' 11"  (1.803 m)   Wt 194 lb 3.2 oz (88.1 kg)   BMI 27.09 kg/m  Physical Exam Vitals and nursing note reviewed.  Constitutional:      Appearance: Normal appearance.     Comments: Overweight but not obese  HENT:     Head: Normocephalic and atraumatic.     Nose: Nose normal. No congestion or rhinorrhea.     Mouth/Throat:     Mouth: Mucous membranes are dry.     Pharynx: No oropharyngeal exudate.  Comments: No noted sores Eyes:     General: No scleral icterus. Cardiovascular:     Rate and Rhythm: Normal rate and regular rhythm.     Heart sounds: Normal heart sounds.  Pulmonary:     Effort: Pulmonary effort is normal.     Breath sounds: Normal breath sounds.  Abdominal:     General: Abdomen is protuberant. Bowel sounds are normal. There is no distension.     Palpations: Abdomen is soft. There is no mass.     Tenderness: There is no abdominal tenderness. There is no guarding or rebound.     Hernia: No hernia is present.  Musculoskeletal:        General: No deformity. Normal range of motion.     Right lower leg: No edema.     Left lower leg: No edema.  Skin:    General: Skin is warm and dry.     Coloration: Skin is not jaundiced.     Findings: No bruising or rash.  Neurological:     General: No focal deficit present.     Mental Status: He is alert and oriented to person, place, and time. Mental status is at baseline.  Psychiatric:        Mood and Affect: Mood normal.        Behavior: Behavior normal.        Thought Content: Thought content normal.        Judgment: Judgment normal.       10/25/2019 9:44 AM   Disclaimer: This note was dictated with voice recognition software. Similar sounding words can inadvertently be transcribed and may not be corrected  upon review.

## 2020-04-26 ENCOUNTER — Other Ambulatory Visit: Payer: Self-pay

## 2020-04-26 ENCOUNTER — Ambulatory Visit (INDEPENDENT_AMBULATORY_CARE_PROVIDER_SITE_OTHER): Payer: Medicare Other | Admitting: Nurse Practitioner

## 2020-04-26 ENCOUNTER — Encounter: Payer: Self-pay | Admitting: Nurse Practitioner

## 2020-04-26 VITALS — BP 145/79 | HR 88 | Temp 97.3°F | Ht 71.0 in | Wt 199.8 lb

## 2020-04-26 DIAGNOSIS — R1030 Lower abdominal pain, unspecified: Secondary | ICD-10-CM

## 2020-04-26 DIAGNOSIS — K51819 Other ulcerative colitis with unspecified complications: Secondary | ICD-10-CM

## 2020-04-26 NOTE — Progress Notes (Signed)
Referring Provider: Cleophas Dunker, MD Primary Care Physician:  Cleophas Dunker, MD Primary GI:  Dr. Gala Romney  Chief Complaint  Patient presents with  . Ulcerative Colitis    f/u. Doing well    HPI:   Jerome Stark is a 74 y.o. male who presents for follow-up on UC and abdominal pain.  The patient was last seen in our office 10/25/2019 for UC and lower abdominal pain.  Noted history of ulcerative colitis previously well managed on Lialda (mesalamine) 4.8 g daily.  Due for repeat colonoscopy 8 years after IBD diagnosis (2024).  Also has Concomitant eczema likewise doing well.  At his last visit noted he had his labs at the [place] states everything was within normal ranges.  Occasional/rare left thumb and groin rash related to eczema but has been on topical therapy for this.  Denies significant chronic joint issues.  He had mildly symptomatic COVID-19 infection in January and did well.  No other overt GI complaints.  Recommended continue Lialda 4.8 g daily, notify for any worsening abdominal pain, diarrhea, rectal bleeding.  [facility] and update labs at the [place] and ask to fax results.  Follow-up in 6 months.  As of yet we have not received lab results from the [place].  Today he states he doing okay overall.  Continues on Lialda 4.8 g daily.  Denies abdominal pain, nausea/vomiting, hematochezia, melena, fever, chills, unintentional weight loss.  His eczema is doing ok overall with minimal flare recently.  Appetite is good and energy is great.  Notes his adopted daughter recently got married and moved out of the house and his wife is having a difficult time with this.  No apparent worsening symptoms with increased recent stress. Denies URI or flu-like symptoms. Denies loss of sense of taste or smell. The patient has received COVID-19 vaccination(s). Denies chest pain, dyspnea, dizziness, lightheadedness, syncope, near syncope. Denies any other upper or  lower GI symptoms.  Past Medical History:  Diagnosis Date  . Hyperlipidemia   . Hypertension   . Hypothyroidism   . 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 Ulcerative colitis [facility])     Past Surgical History:  Procedure Laterality Date  . BACK SURGERY     lower back / lumbar   . 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 COLONOSCOPY  March 2015   Dr. Britta Mccreedy: diminutive hyperplastic polyp. scope advanced to the cecum, no intubation of TI, no random biopsies performed  . COLONOSCOPY N/A 11/29/2014   Dr. Gala Romney: left-sided proctocolitis   . KNEE ARTHROPLASTY Right 01/08/2017   Procedure: RIGHT TOTAL KNEE ARTHROPLASTY WITH COMPUTER NAVIGATION;  Surgeon: Rod Can, MD;  Location: [facility];  Service: Orthopedics;  Laterality: Right;  Needs RNFA; Adductor block  . KNEE ARTHROPLASTY Left 02/26/2017   Procedure: LEFT TOTAL KNEE ARTHROPLASTY;  Surgeon: Rod Can, MD;  Location: [facility];  Service: Orthopedics;  Laterality: Left;  Needs RNFA  . LUNG SURGERY     collapsed lung; bilateral per patient "they called it a bleb that burst"     Current Outpatient Medications  Medication Sig Dispense Refill  . amLODipine (NORVASC) 10 MG tablet Take 10 mg by mouth every morning.  3  . hydrochlorothiazide (HYDRODIURIL) 25 MG tablet Take 25 mg by mouth daily.  3  . levothyroxine (SYNTHROID, LEVOTHROID) 75 MCG tablet Take 75 mcg by mouth daily before breakfast.    . mesalamine (LIALDA) 1.2  G EC tablet Take 4 tablets (4.8 g total) by mouth daily with breakfast. 360 tablet 1   No current facility-administered medications for this visit.    Allergies as of 04/26/2020  . (No Known Allergies)    Family History  Problem Relation Age of Onset  . Hypertension Mother   . Heart disease Mother   . Hypertension Other   . Heart attack Brother        coronary artery stent at age 32  . Heart attack Maternal Grandfather        died of MI in late 39s  . Colon cancer Neg Hx     Social History    Socioeconomic History  . Marital status: Married    Spouse name: Not on file  . Number of children: Not on file  . Years of education: Not on file  . Highest education level: Not on file  Occupational History  . Not on file  Tobacco Use  . Smoking status: Former Smoker    Quit date: 02/14/1985    Years since quitting: 35.2  . Smokeless tobacco: Never Used  . Tobacco comment: smoked 1 ppd for 25 years, quit at age 56  Vaping Use  . Vaping Use: Never used  Substance and Sexual Activity  . Alcohol use: Yes    Alcohol/week: 0.0 standard drinks    Comment: occasional wine  . Drug use: No  . Sexual activity: Not on file  Other Topics Concern  . Not on file  Social History Narrative  . Not on file   Social Determinants of Health   Financial Resource Strain:   . Difficulty of Paying Living Expenses: Not on file  Food Insecurity:   . Worried About Charity fundraiser in the Last Year: Not on file  . Ran Out of Food in the Last Year: Not on file  Transportation Needs:   . Lack of Transportation (Medical): Not on file  . Lack of Transportation (Non-Medical): Not on file  Physical Activity:   . Days of Exercise per Week: Not on file  . Minutes of Exercise per Session: Not on file  Stress:   . Feeling of Stress : Not on file  Social Connections:   . Frequency of Communication with Friends and Family: Not on file  . Frequency of Social Gatherings with Friends and Family: Not on file  . Attends Religious Services: Not on file  . Active Member of Clubs or Organizations: Not on file  . Attends Archivist Meetings: Not on file  . Marital Status: Not on file    Subjective: Review of Systems  Constitutional: Negative for chills, fever, malaise/fatigue and weight loss.  HENT: Negative for congestion and sore throat.   Respiratory: Negative for cough and shortness of breath.   Cardiovascular: Negative for chest pain and palpitations.  Gastrointestinal: Negative for  abdominal pain, blood in stool, diarrhea, melena, nausea and vomiting.  Musculoskeletal: Negative for joint pain and myalgias.  Skin: Negative for rash.  Neurological: Negative for dizziness and weakness.  Endo/Heme/Allergies: Does not bruise/bleed easily.  Psychiatric/Behavioral: Negative for depression. The patient is not nervous/anxious.   All other systems reviewed and are negative.    Objective: BP (!) 145/79   Pulse 88   Temp (!) 97.3 F (36.3 C) (Oral)   Ht 5' 11"  (1.803 m)   Wt 199 lb 12.8 oz (90.6 kg)   BMI 27.87 kg/m  Physical Exam Vitals and nursing note reviewed.  Constitutional:  General: He is not in acute distress.    Appearance: Normal appearance. He is normal weight. He is not ill-appearing, toxic-appearing or diaphoretic.  HENT:     Head: Normocephalic and atraumatic.     Nose: No congestion or rhinorrhea.  Eyes:     General: No scleral icterus. Cardiovascular:     Rate and Rhythm: Normal rate and regular rhythm.     Heart sounds: Normal heart sounds.  Pulmonary:     Effort: Pulmonary effort is normal.     Breath sounds: Normal breath sounds.  Abdominal:     General: Bowel sounds are normal. There is no distension.     Palpations: Abdomen is soft. There is no hepatomegaly, splenomegaly or mass.     Tenderness: There is no abdominal tenderness. There is no guarding or rebound.     Hernia: No hernia is present.  Musculoskeletal:     Cervical back: Neck supple.  Skin:    General: Skin is warm and dry.     Coloration: Skin is not jaundiced.     Findings: No bruising or rash.  Neurological:     General: No focal deficit present.     Mental Status: He is alert and oriented to person, place, and time. Mental status is at baseline.  Psychiatric:        Mood and Affect: Mood normal.        Behavior: Behavior normal.        Thought Content: Thought content normal.      Assessment:  Very pleasant 74 year old male with a history of ulcerative  colitis currently on Lialda 4.8 g a day.  He is doing quite well on this.  Denies any overt worsening symptoms despite significant stress at home with his adoptive daughter getting married and moving out.  He also has concomitant eczema but this is doing pretty well with only very mild flare recently.  Sees dermatology.  Denies any symptoms suggestive of UC flare including no abdominal pain, diarrhea, hematochezia, joint pains, worsening fatigue, poor appetite.  No rashes or mouth sores.  Overall he feels his disease is well controlled on Lialda.  He is due for updated serologies.  He states he will have his previous labs faxed from the [place] or brought to our office.   Plan: 1. CBC, CMP 2. Continue current medications 3. Placed on recall for repeat colonoscopy in April 2024 4. Follow-up in 6 months 5. Call for any worsening or severe symptoms.    Thank you for allowing [place] to participate in the care of [place], DNP, AGNP-C Adult & Gerontological Nurse Practitioner [facility]   04/26/2020 9:26 AM   Disclaimer: This note was dictated with voice recognition software. Similar sounding words can inadvertently be transcribed and may not be corrected upon review.

## 2020-04-26 NOTE — Patient Instructions (Signed)
Your health issues we discussed today were:   Ulcerative colitis with previous abdominal pain: 1. I am glad you continue to do well! 2. Continue your current medications (Lialda 4.8 g daily) 3. Have your labs drawn when you are able to and request the [place] fax them to our office 4. Call us for any symptoms of possible flare including worsening abdominal pain, diarrhea, blood in your stools, weakness, weight loss  Overall I recommend:  1. Continue your other current medications 2. Return for follow-up in 6 months 3. Call us for any questions or concerns   ---------------------------------------------------------------  I am glad you have gotten your COVID-19 vaccination!  Even though you are fully vaccinated you should continue to follow CDC and state/local guidelines.  ---------------------------------------------------------------   At [facility] we value your feedback. You may receive a survey about your visit today. Please share your experience as we strive to create trusting relationships with our patients to provide genuine, compassionate, quality care.  We appreciate your understanding and patience as we review any laboratory studies, imaging, and other diagnostic tests that are ordered as we care for you. Our office policy is 5 business days for review of these results, and any emergent or urgent results are addressed in a timely manner for your best interest. If you do not hear from our office in 1 week, please contact us.   We also encourage the use of MyChart, which contains your medical information for your review as well. If you are not enrolled in this feature, an access code is on this after visit summary for your convenience. Thank you for allowing [place] to be involved in your care.  It was great to see you today!  I hope you have a great fall!!

## 2020-04-26 NOTE — Addendum Note (Signed)
Addended by: Gordy Levan, Joni Colegrove A on: 04/26/2020 09:30 AM   Modules accepted: Orders

## 2020-08-15 ENCOUNTER — Other Ambulatory Visit (HOSPITAL_COMMUNITY): Payer: Self-pay | Admitting: Nurse Practitioner

## 2020-08-15 DIAGNOSIS — M25512 Pain in left shoulder: Secondary | ICD-10-CM

## 2020-08-24 ENCOUNTER — Ambulatory Visit (HOSPITAL_COMMUNITY)
Admission: RE | Admit: 2020-08-24 | Discharge: 2020-08-24 | Disposition: A | Discharge status: Home / Self Care | Payer: No Typology Code available for payment source | Source: Ambulatory Visit | Attending: Nurse Practitioner | Admitting: Nurse Practitioner

## 2020-08-24 ENCOUNTER — Other Ambulatory Visit: Payer: Self-pay

## 2020-08-24 DIAGNOSIS — M25512 Pain in left shoulder: Secondary | ICD-10-CM | POA: Insufficient documentation

## 2020-10-25 ENCOUNTER — Ambulatory Visit (INDEPENDENT_AMBULATORY_CARE_PROVIDER_SITE_OTHER): Payer: Medicare Other | Admitting: Nurse Practitioner

## 2020-10-25 ENCOUNTER — Encounter: Payer: Self-pay | Admitting: Nurse Practitioner

## 2020-10-25 ENCOUNTER — Other Ambulatory Visit: Payer: Self-pay

## 2020-10-25 VITALS — BP 139/81 | HR 70 | Temp 97.3°F | Ht 71.0 in | Wt 198.4 lb

## 2020-10-25 DIAGNOSIS — R1030 Lower abdominal pain, unspecified: Secondary | ICD-10-CM | POA: Diagnosis not present

## 2020-10-25 DIAGNOSIS — K51819 Other ulcerative colitis with unspecified complications: Secondary | ICD-10-CM | POA: Diagnosis not present

## 2020-10-25 NOTE — Progress Notes (Signed)
Referring Provider: Cleophas Dunker, MD Primary Care Physician:  Cleophas Dunker, MD Primary GI:  Dr. Gala Romney  Chief Complaint  Patient presents with  . Ulcerative Colitis    F/u. Doing fine    HPI:   Jerome Stark is a 75 y.o. male who presents for follow-up.  The patient was last seen in our office 04/26/2020 for lower abdominal pain and ulcerative colitis.  Noted history of ulcerative colitis previously well managed on Lialda (mesalamine) 4.8 g daily.  Due for repeat colonoscopy 8 years after IBD diagnosis in 2024.  Concomitant eczema also doing well.  At his last visit continues on the Lialda as prescribed.  Eczema doing okay with minimal flare recently.  Appetite and energy are good.  Undergoing family stress but no apparent worsening of symptoms in the setting.  Generally asymptomatic from a GI standpoint.  Recommended updating labs including CBC, CMP, continue current medications, placed on recall for repeat colonoscopy in April 2024, follow-up 6 months.  It does not appear that labs were completed, although I think he may be getting his labs at the [place].  Today states doing okay overall. Feels well, still on Lialda 4.8 g daily. Appetite and energy is good. Eczema doing well, no recent flares. Stress is doing better. Still working every day, owns a [employer]. Denies abdominal pain, N/V, hematochezia, melena, fever, chills, unintentional weight loss. He does get his labs at the [place], states they're doing his labs once a year. States he's next due for labs June/July this year and will request they send copy to [place]. Denies URI or flu-like symptoms. Denies loss of sense of taste or smell. The patient has received COVID-19 vaccination(s). Denies chest pain, dyspnea, dizziness, lightheadedness, syncope, near syncope. Denies any other upper or lower GI symptoms.  Past Medical History:  Diagnosis Date  . Hyperlipidemia   . Hypertension   . Hypothyroidism   . 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 Ulcerative colitis [facility])     Past Surgical History:  Procedure Laterality Date  . BACK SURGERY     lower back / lumbar   . 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 COLONOSCOPY  March 2015   Dr. Britta Mccreedy: diminutive hyperplastic polyp. scope advanced to the cecum, no intubation of TI, no random biopsies performed  . COLONOSCOPY N/A 11/29/2014   Dr. Gala Romney: left-sided proctocolitis   . KNEE ARTHROPLASTY Right 01/08/2017   Procedure: RIGHT TOTAL KNEE ARTHROPLASTY WITH COMPUTER NAVIGATION;  Surgeon: Rod Can, MD;  Location: [facility];  Service: Orthopedics;  Laterality: Right;  Needs RNFA; Adductor block  . KNEE ARTHROPLASTY Left 02/26/2017   Procedure: LEFT TOTAL KNEE ARTHROPLASTY;  Surgeon: Rod Can, MD;  Location: [facility];  Service: Orthopedics;  Laterality: Left;  Needs RNFA  . LUNG SURGERY     collapsed lung; bilateral per patient "they called it a bleb that burst"     Current Outpatient Medications  Medication Sig Dispense Refill  . amLODipine (NORVASC) 10 MG tablet Take 10 mg by mouth every morning.  3  . hydrochlorothiazide (HYDRODIURIL) 25 MG tablet Take 25 mg by mouth daily.  3  . levothyroxine (SYNTHROID, LEVOTHROID) 75 MCG tablet Take 75 mcg by mouth daily before breakfast.    . mesalamine (LIALDA) 1.2 G EC tablet Take 4 tablets (4.8 g total) by mouth daily with breakfast. 360 tablet 1   No current facility-administered medications for this visit.  Allergies as of 10/25/2020  . (No Known Allergies)    Family History  Problem Relation Age of Onset  . Hypertension Mother   . Heart disease Mother   . Hypertension Other   . Heart attack Brother        coronary artery stent at age 43  . Heart attack Maternal Grandfather        died of MI in late 72s  . Colon cancer Neg Hx     Social History   Socioeconomic History  . Marital status: Married    Spouse name: Not on file  . Number of children: Not on file  . Years of education: Not on  file  . Highest education level: Not on file  Occupational History  . Not on file  Tobacco Use  . Smoking status: Former Smoker    Quit date: 02/14/1985    Years since quitting: 35.7  . Smokeless tobacco: Never Used  . Tobacco comment: smoked 1 ppd for 25 years, quit at age 35  Vaping Use  . Vaping Use: Never used  Substance and Sexual Activity  . Alcohol use: Yes    Alcohol/week: 0.0 standard drinks    Comment: occasional wine  . Drug use: No  . Sexual activity: Not on file  Other Topics Concern  . Not on file  Social History Narrative  . Not on file   Social Determinants of Health   Financial Resource Strain: Not on file  Food Insecurity: Not on file  Transportation Needs: Not on file  Physical Activity: Not on file  Stress: Not on file  Social Connections: Not on file    Subjective: Review of Systems  Constitutional: Negative for chills, fever, malaise/fatigue and weight loss.  HENT: Negative for congestion and sore throat.   Respiratory: Negative for cough and shortness of breath.   Cardiovascular: Negative for chest pain and palpitations.  Gastrointestinal: Negative for abdominal pain, blood in stool, constipation, diarrhea, heartburn, melena, nausea and vomiting.  Musculoskeletal: Negative for joint pain and myalgias.  Skin: Negative for rash.  Neurological: Negative for dizziness and weakness.  Endo/Heme/Allergies: Does not bruise/bleed easily.  Psychiatric/Behavioral: Negative for depression. The patient is not nervous/anxious.   All other systems reviewed and are negative.    Objective: BP 139/81   Pulse 70   Temp (!) 97.3 F (36.3 C)   Ht 5' 11"  (1.803 m)   Wt 198 lb 6.4 oz (90 kg)   BMI 27.67 kg/m  Physical Exam Vitals and nursing note reviewed.  Constitutional:      General: He is not in acute distress.    Appearance: Normal appearance. He is normal weight. He is not ill-appearing, toxic-appearing or diaphoretic.  HENT:     Head:  Normocephalic and atraumatic.     Nose: No congestion or rhinorrhea.  Eyes:     General: No scleral icterus. Cardiovascular:     Rate and Rhythm: Normal rate and regular rhythm.     Heart sounds: Normal heart sounds.  Pulmonary:     Effort: Pulmonary effort is normal.     Breath sounds: Normal breath sounds.  Abdominal:     General: Bowel sounds are normal. There is no distension.     Palpations: Abdomen is soft. There is no hepatomegaly, splenomegaly or mass.     Tenderness: There is no abdominal tenderness. There is no guarding or rebound.     Hernia: No hernia is present.  Musculoskeletal:     Cervical back:  Neck supple.  Skin:    General: Skin is warm and dry.     Coloration: Skin is not jaundiced.     Findings: No bruising or rash.  Neurological:     General: No focal deficit present.     Mental Status: He is alert and oriented to person, place, and time. Mental status is at baseline.  Psychiatric:        Mood and Affect: Mood normal.        Behavior: Behavior normal.        Thought Content: Thought content normal.      Assessment:  Very pleasant 75 year old male presents to follow-up on ulcerative colitis.  He has been well managed for years on the Lialda.  Currently on 4.8 g daily.  Also with concomitant eczema that is doing well with no recent flares.  Appetite and energy good.  No abdominal pain, diarrhea, hematochezia.  Overall he is satisfied with his disease maintenance state.  He has his labs completed at the [place] but they generally only check them once a year.  He is next due in June/July and we will request that they send copies to our office.  Overall he is generally asymptomatic from a GI standpoint   Plan: 1. Continue current medications 2. Call us for any worsening or severe symptoms 3. Follow-up in 6 months 4. Have labs drawn at the [facility]    Thank you for allowing [place] to participate in the care of [place], DNP, AGNP-C Adult &  Gerontological Nurse Practitioner [facility]   10/25/2020 8:54 AM   Disclaimer: This note was dictated with voice recognition software. Similar sounding words can inadvertently be transcribed and may not be corrected upon review.

## 2020-10-25 NOTE — Patient Instructions (Signed)
Your health issues we discussed today were:   Ulcerative colitis: 1. Labs are doing well! 2. Continue take myalgia 4.8 g (4 pills) every day 3. Call for any worsening or severe symptoms, especially abdominal pain, diarrhea, rectal bleeding 4. When you have your labs drawn at the [place] in June/July, please request they send [place] a copy  Overall I recommend:  1. Continue other current medications 2. Return for follow-up in 6 months 3. Call us if you have any questions or concerns   ---------------------------------------------------------------  I am glad you have gotten your COVID-19 vaccination!  Even though you are fully vaccinated you should continue to follow CDC and state/local guidelines.  ---------------------------------------------------------------   At [facility] we value your feedback. You may receive a survey about your visit today. Please share your experience as we strive to create trusting relationships with our patients to provide genuine, compassionate, quality care.  We appreciate your understanding and patience as we review any laboratory studies, imaging, and other diagnostic tests that are ordered as we care for you. Our office policy is 5 business days for review of these results, and any emergent or urgent results are addressed in a timely manner for your best interest. If you do not hear from our office in 1 week, please contact us.   We also encourage the use of MyChart, which contains your medical information for your review as well. If you are not enrolled in this feature, an access code is on this after visit summary for your convenience. Thank you for allowing [place] to be involved in your care.  It was great to see you today!  I hope you have a great spring!!

## 2021-04-24 NOTE — Progress Notes (Signed)
Referring Provider: Cleophas Dunker, MD Primary Care Physician:  Cleophas Dunker, MD Primary GI Physician: Dr. Gala Romney  Chief Complaint  Patient presents with   Ulcerative Colitis    Doing ok    HPI:   Jerome Stark is a 75 y.o. male with history of left-sided proctocolitis diagnosed in 2016 presenting today for follow-up.  Last seen in our office 10/25/2020.  He was doing well on Lialda 4.8 g daily.  Appetite and energy good.  Eczema doing well.  No significant GI symptoms.  Labs have been monitored yearly at the [place].  He was due for labs in June/July, we will request results.  Advised to continue current medications and follow-up in 6 months.  Today: States he is doing very well.  Denies abdominal pain, BRBPR, melena.  Bowels are moving daily.  Denies nausea, vomiting, reflux symptoms, dysphagia.  Appetite and energy are good.  Reports his only problem is weight gain which she plans to work on.  He has routine lab work at the [place], yearly.  Last blood work completed about 5 months ago. Reports he does have history of vitamin D deficiency and just resumed taking vitamin D about 1 week ago.  Denies NSAIDs. Denies tobacco use.  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 Britta Mccreedy: diminutive hyperplastic polyp. scope advanced to the cecum, no intubation of TI, no random biopsies performed   COLONOSCOPY N/A 11/29/2014   Dr. Gala Romney: left-sided proctocolitis    KNEE ARTHROPLASTY Right 01/08/2017   Procedure: RIGHT TOTAL KNEE ARTHROPLASTY WITH COMPUTER NAVIGATION;  Surgeon: Rod Can, MD;  Location: [facility];  Service: Orthopedics;   Laterality: Right;  Needs RNFA; Adductor block   KNEE ARTHROPLASTY Left 02/26/2017   Procedure: LEFT TOTAL KNEE ARTHROPLASTY;  Surgeon: Rod Can, MD;  Location: [facility];  Service: Orthopedics;  Laterality: Left;  Needs RNFA   LUNG SURGERY     collapsed lung; bilateral per patient "they called it a bleb that burst"     Current Outpatient Medications  Medication Sig Dispense Refill   amLODipine (NORVASC) 10 MG tablet Take 10 mg by mouth every morning.  3   Ascorbic Acid (VITAMIN C PO) Take by mouth.     hydrochlorothiazide (HYDRODIURIL) 25 MG tablet Take 25 mg by mouth daily.  3   levothyroxine (SYNTHROID, LEVOTHROID) 75 MCG tablet Take 75 mcg by mouth daily before breakfast.     mesalamine (LIALDA) 1.2 G EC tablet Take 4 tablets (4.8 g total) by mouth daily with breakfast. 360 tablet 1   VITAMIN D, CHOLECALCIFEROL, PO Take by mouth.     No current facility-administered medications for this visit.    Allergies as of 04/25/2021   (No Known Allergies)    Family History  Problem Relation Age of Onset   Hypertension Mother    Heart disease Mother    Heart attack Brother        coronary artery stent at age 63   Heart attack Maternal Grandfather        died of  MI in late 50s   Hypertension Other    Colon cancer Neg Hx    Inflammatory bowel disease Neg Hx     Social History   Socioeconomic History   Marital status: Married    Spouse name: Not on file   Number of children: Not on file   Years of education: Not on file   Highest education level: Not on file  Occupational History   Not on file  Tobacco Use   Smoking status: Former    Types: Cigarettes    Quit date: 02/14/1985    Years since quitting: 36.2   Smokeless tobacco: Never   Tobacco comments:    smoked 1 ppd for 25 years, quit at age 13  Vaping Use   Vaping Use: Never used  Substance and Sexual Activity   Alcohol use: Yes    Alcohol/week: 0.0 standard drinks    Comment: occasional wine   Drug use: No    Sexual activity: Not on file  Other Topics Concern   Not on file  Social History Narrative   Not on file   Social Determinants of Health   Financial Resource Strain: Not on file  Food Insecurity: Not on file  Transportation Needs: Not on file  Physical Activity: Not on file  Stress: Not on file  Social Connections: Not on file    Review of Systems: Gen: Denies fever, chills, cold or flulike symptoms, presyncope, syncope. CV: Denies chest pain, palpitations. Resp: Denies dyspnea or cough. GI: See HPI Derm: Reports small eczema outbreak on his right elbow, improving.  Heme: See HPI  Physical Exam: BP 125/77   Pulse 95   Temp (!) 97.3 F (36.3 C) (Temporal)   Ht 5' 11"  (1.803 m)   Wt 200 lb (90.7 kg)   BMI 27.89 kg/m  General:   Alert and oriented. No distress noted. Pleasant and cooperative.  Head:  Normocephalic and atraumatic. Eyes:  Conjuctiva clear without scleral icterus. Heart:  S1, S2 present without murmurs appreciated. Lungs:  Clear to auscultation bilaterally. No wheezes, rales, or rhonchi. No distress.  Abdomen:  +BS, soft, non-tender and non-distended. No rebound or guarding. No HSM or masses noted. Msk:  Symmetrical without gross deformities. Normal posture. Extremities:  Without edema. Neurologic:  Alert and  oriented x4 Psych: Normal mood and affect.    Assessment: 75 year old male with history of left-sided proctocolitis diagnosed in 2016 presenting today for follow-up.  His symptoms have been very well controlled for quite some time on mesalamine 4.8 g daily.  No significant GI symptoms.  No alarm symptoms.  Reports yearly blood work at the [place], last about 5 months ago.  Also reports history of vitamin D deficiency and just resumed taking vitamin D about 1 week ago. Denies NSAIDs or tobacco use. No family history of IBD or colon cancer.   Plan: 1.  Continue mesalamine 4.8 g daily. 2.  Request blood work from the [place]. 3.  Continue vitamin D  supplement. 4.  Colonoscopy due in 2024 (8 years after diagnosis). 5.  Follow-up in 6 months or sooner if needed.    Aliene Altes, PA-C [facility] 04/25/2021

## 2021-04-25 ENCOUNTER — Ambulatory Visit: Payer: Medicare Other | Admitting: Gastroenterology

## 2021-04-25 ENCOUNTER — Encounter: Payer: Self-pay | Admitting: Gastroenterology

## 2021-04-25 ENCOUNTER — Ambulatory Visit: Payer: Non-veteran care | Admitting: Nurse Practitioner

## 2021-04-25 ENCOUNTER — Other Ambulatory Visit: Payer: Self-pay

## 2021-04-25 VITALS — BP 125/77 | HR 95 | Temp 97.3°F | Ht 71.0 in | Wt 200.0 lb

## 2021-04-25 DIAGNOSIS — K5180 Other ulcerative colitis without complications: Secondary | ICD-10-CM

## 2021-04-25 NOTE — Patient Instructions (Addendum)
Continue mesalamine 4 tablets daily with breakfast.  I am requesting your last blood work from the [place] for review.  We will let you know if we need anything in addition to this.  Continue taking your vitamin D supplement.  We will plan to see you back in 6 months.  Do not hesitate to call if you have any questions or concerns prior to your next visit.  It was a pleasure meeting you today!   Aliene Altes, PA-C [facility]

## 2021-05-15 ENCOUNTER — Telehealth: Payer: Self-pay | Admitting: Gastroenterology

## 2021-05-15 NOTE — Telephone Encounter (Signed)
Jerome Stark, can you request labs results within the last year from the [place]? I think we requested them after patient's last OV (9/22), but I have not received anything.

## 2021-05-15 NOTE — Telephone Encounter (Signed)
Noted  

## 2021-05-21 ENCOUNTER — Emergency Department (HOSPITAL_COMMUNITY): Payer: No Typology Code available for payment source

## 2021-05-21 ENCOUNTER — Emergency Department (HOSPITAL_COMMUNITY)
Admission: EM | Admit: 2021-05-21 | Discharge: 2021-05-22 | Disposition: A | Discharge status: Home / Self Care | Payer: No Typology Code available for payment source | Attending: Emergency Medicine | Admitting: Emergency Medicine

## 2021-05-21 DIAGNOSIS — R202 Paresthesia of skin: Secondary | ICD-10-CM | POA: Insufficient documentation

## 2021-05-21 DIAGNOSIS — Z96653 Presence of artificial knee joint, bilateral: Secondary | ICD-10-CM | POA: Diagnosis not present

## 2021-05-21 DIAGNOSIS — I10 Essential (primary) hypertension: Secondary | ICD-10-CM | POA: Diagnosis not present

## 2021-05-21 DIAGNOSIS — Z79899 Other long term (current) drug therapy: Secondary | ICD-10-CM | POA: Diagnosis not present

## 2021-05-21 DIAGNOSIS — E039 Hypothyroidism, unspecified: Secondary | ICD-10-CM | POA: Diagnosis not present

## 2021-05-21 DIAGNOSIS — R519 Headache, unspecified: Secondary | ICD-10-CM | POA: Diagnosis not present

## 2021-05-21 DIAGNOSIS — R739 Hyperglycemia, unspecified: Secondary | ICD-10-CM | POA: Insufficient documentation

## 2021-05-21 DIAGNOSIS — Z87891 Personal history of nicotine dependence: Secondary | ICD-10-CM | POA: Diagnosis not present

## 2021-05-21 LAB — BASIC METABOLIC PANEL
Anion gap: 10 (ref 5–15)
BUN: 9 mg/dL (ref 8–23)
CO2: 24 mmol/L (ref 22–32)
Calcium: 8.9 mg/dL (ref 8.9–10.3)
Chloride: 104 mmol/L (ref 98–111)
Creatinine, Ser: 0.97 mg/dL (ref 0.61–1.24)
GFR, Estimated: >60 mL/min (ref >60)
Glucose, Bld: 108 mg/dL — ABNORMAL HIGH (ref 70–99)
Potassium: 3.5 mmol/L (ref 3.5–5.1)
Sodium: 138 mmol/L (ref 135–145)

## 2021-05-21 LAB — CBC WITH DIFFERENTIAL/PLATELET
Abs Immature Granulocytes: 0.03 10*3/uL (ref 0.00–0.07)
Basophils Absolute: 0 10*3/uL (ref 0.0–0.1)
Basophils Relative: 1 %
Eosinophils Absolute: 0.2 10*3/uL (ref 0.0–0.5)
Eosinophils Relative: 2 %
HCT: 44.8 % (ref 39.0–52.0)
Hemoglobin: 15.4 g/dL (ref 13.0–17.0)
Immature Granulocytes: 0 %
Lymphocytes Relative: 26 %
Lymphs Abs: 2.1 10*3/uL (ref 0.7–4.0)
MCH: 32.2 pg (ref 26.0–34.0)
MCHC: 34.4 g/dL (ref 30.0–36.0)
MCV: 93.7 fL (ref 80.0–100.0)
Monocytes Absolute: 0.6 10*3/uL (ref 0.1–1.0)
Monocytes Relative: 8 %
Neutro Abs: 5.3 10*3/uL (ref 1.7–7.7)
Neutrophils Relative %: 63 %
Platelets: 213 10*3/uL (ref 150–400)
RBC: 4.78 MIL/uL (ref 4.22–5.81)
RDW: 13.1 % (ref 11.5–15.5)
WBC: 8.3 10*3/uL (ref 4.0–10.5)
nRBC: 0 % (ref 0.0–0.2)

## 2021-05-21 LAB — TSH: TSH: 3.29 u[IU]/mL (ref 0.350–4.500)

## 2021-05-21 NOTE — ED Triage Notes (Signed)
Pt here with numbness to R thumb this morning slowly progressing up to his elbow. Pt then developed numbness to R cheek and then R foot. No facial droop, weakness noted.

## 2021-05-21 NOTE — ED Notes (Signed)
Tingling to right leg below the knee

## 2021-05-21 NOTE — ED Notes (Signed)
Patient transported to CT 

## 2021-05-21 NOTE — ED Provider Notes (Signed)
[place] EMERGENCY DEPARTMENT Provider Note   CSN: [id number] Arrival date & time: 05/21/21  1217     History No chief complaint on file.   Jerome Stark is a 75 y.o. male.  HPI  Patient with significant medical history of hypertension, hyperlipidemia, UC presents to the emergency department with chief complaint of paresthesias.  Patient states symptoms started around 6:30 AM started originally in his right thumb and progressed up to his righ elbow, endorses numbness around his right cheek as well as numbness from his right knee down to his foot.  States this lasted over an hour, and started to feel better once he got to the hospital, he states that while waiting in the waiting room his symptoms started to come back again but not as severe as prior this started proxy 1 hour ago.  He alsohas a slight headache on the right side of his head, states this feels like a typical headache to him, not the worst headache of his life.  He has no associated weakness in the upper or lower extremities, he denies change in vision, no nausea, vomiting, lightheaded or dizziness, he denies neck pain, back pain, chest pain, shortness of breath, peripheral edema, no associate fevers or chills, no history of IV drug use, he is never had a CVA or TIA in the past, no smoking history, blood pressure is well under control. 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 Britta Mccreedy: diminutive hyperplastic polyp. scope advanced to the cecum, no intubation of TI, no random biopsies performed   COLONOSCOPY N/A 11/29/2014   Dr. Gala Romney: left-sided proctocolitis    KNEE ARTHROPLASTY Right 01/08/2017   Procedure: RIGHT TOTAL KNEE ARTHROPLASTY WITH COMPUTER NAVIGATION;  Surgeon: Rod Can, MD;  Location: [facility] ORS;  Service: Orthopedics;  Laterality: Right;  Needs RNFA; Adductor block   KNEE ARTHROPLASTY Left 02/26/2017   Procedure: LEFT TOTAL KNEE ARTHROPLASTY;  Surgeon: Rod Can, MD;  Location: [facility] ORS;  Service: Orthopedics;  Laterality: Left;  Needs RNFA   LUNG SURGERY     collapsed lung; bilateral per patient "they called it a bleb that burst"        Family History  Problem Relation Age of Onset   Hypertension Mother    Heart disease Mother    Heart attack Brother        coronary artery stent at age 14   Heart attack Maternal Grandfather        died of MI in late 71s   Hypertension Other    Colon cancer Neg Hx    Inflammatory bowel disease Neg Hx     Social History   Tobacco Use   Smoking status: Former    Types: Cigarettes    Quit date:  02/14/1985    Years since quitting: 36.2   Smokeless tobacco: Never   Tobacco comments:    smoked 1 ppd for 25 years, quit at age 33  Vaping Use   Vaping Use: Never used  Substance Use Topics   Alcohol use: Yes    Alcohol/week: 0.0 standard drinks    Comment: occasional wine   Drug use: No    Home Medications Prior to Admission medications   Medication Sig Start Date End Date Taking? Authorizing Provider  acetaminophen (TYLENOL) 500 MG tablet Take 1,500-2,000 mg by mouth every 6 (six) hours as needed for moderate pain or headache.   Yes [provider]  amLODipine (NORVASC) 10 MG tablet Take 10 mg by mouth every morning. 12/18/14  Yes [provider]  Ascorbic Acid (VITAMIN C PO)  Take 500 mg by mouth daily.   Yes [provider]  hydrochlorothiazide (HYDRODIURIL) 25 MG tablet Take 25 mg by mouth daily. 12/18/14  Yes [provider]  levothyroxine (SYNTHROID, LEVOTHROID) 75 MCG tablet Take 75 mcg by mouth daily before breakfast.   Yes [provider]  lisinopril (ZESTRIL) 20 MG tablet Take 20 mg by mouth daily. 03/15/21  Yes [provider]  mesalamine (LIALDA) 1.2 G EC tablet Take 4 tablets (4.8 g total) by mouth daily with breakfast. 02/15/15  Yes Carlis Stable, NP  terbinafine (LAMISIL) 250 MG tablet Take 250 mg by mouth daily.   Yes [provider]  VITAMIN D, CHOLECALCIFEROL, PO Take 1,000 Units by mouth daily.   Yes [provider]  zinc gluconate 50 MG tablet Take 50 mg by mouth daily.   Yes [provider]    Allergies    Patient has no known allergies.  Review of Systems   Review of Systems  Constitutional:  Negative for chills and fever.  HENT:  Negative for congestion.   Respiratory:  Negative for shortness of breath.   Cardiovascular:  Negative for chest pain.  Gastrointestinal:  Negative for abdominal pain, diarrhea, nausea and vomiting.  Genitourinary:  Negative for enuresis.  Musculoskeletal:  Negative for back pain.  Skin:  Negative for rash.  Neurological:  Positive for numbness and headaches. Negative for dizziness and weakness.  Hematological:  Does not bruise/bleed easily.   Physical Exam Updated Vital Signs BP 132/84 (BP Location: Left Arm)   Pulse 76   Temp 97.8 F (36.6 C) (Oral)   Resp 20   SpO2 99%   Physical Exam Vitals and nursing note reviewed.  Constitutional:      General: He is not in acute distress.    Appearance: He is not ill-appearing.  HENT:     Head: Normocephalic and atraumatic.     Comments: No deformity to head present, no raccoon eyes or battle sign noted.    Nose: No congestion.  Eyes:     Extraocular Movements: Extraocular movements intact.      Conjunctiva/sclera: Conjunctivae normal.  Cardiovascular:     Rate and Rhythm: Normal rate and regular rhythm.     Pulses: Normal pulses.     Heart sounds: No murmur heard.   No friction rub. No gallop.  Pulmonary:     Effort: No respiratory distress.     Breath sounds: No wheezing, rhonchi or rales.  Musculoskeletal:     Cervical back: Neck supple.     Comments: Full range of motion the upper and lower extremities, 5 of 5 strength, neurovascular fully intact, 2+ patellar reflexes.  Skin:  General: Skin is warm and dry.  Neurological:     Mental Status: He is alert.     GCS: GCS eye subscore is 4. GCS verbal subscore is 5. GCS motor subscore is 6.     Cranial Nerves: No cranial nerve deficit or facial asymmetry.     Sensory: Sensory deficit present.     Motor: No weakness.     Coordination: Romberg sign negative. Finger-Nose-Finger Test normal.     Gait: Gait is intact.     Comments: Cranial nerves II through XII are grossly intact, no diffuse word finding, no slurring of his words, no unilateral weakness present, gait fully intact.  Had patchy paresthesias, noted paresthesias on the anterior aspect of his right hand, right cheek, and the anterior aspect of his right shin.  Psychiatric:        Mood and Affect: Mood normal.    ED Results / Procedures / Treatments   Labs (all labs ordered are listed, but only abnormal results are displayed) Labs Reviewed  BASIC METABOLIC PANEL - Abnormal; Notable for the following components:      Result Value   Glucose, Bld 108 (*)    All other components within normal limits  CBC WITH DIFFERENTIAL/PLATELET  TSH    EKG EKG Interpretation  Date/Time:  Tuesday May 21 2021 13:15:52 EDT Ventricular Rate:  68 PR Interval:  162 QRS Duration: 98 QT Interval:  404 QTC Calculation: 429 R Axis:   3 Text Interpretation: Normal sinus rhythm Incomplete right bundle branch block Possible Anterior infarct , age undetermined Abnormal ECG No  significant change since last tracing Confirmed by Isla Pence [phone]) on 05/21/2021 9:29:42 PM  Radiology CT Head Wo Contrast  Result Date: 05/21/2021 CLINICAL DATA:  Chronic headaches and right hand numbness, initial encounter EXAM: CT HEAD WITHOUT CONTRAST TECHNIQUE: Contiguous axial images were obtained from the base of the skull through the vertex without intravenous contrast. COMPARISON:  None. FINDINGS: Brain: No evidence of acute infarction, hemorrhage, hydrocephalus, extra-axial collection or mass lesion/mass effect. Chronic white matter ischemic changes are noted. Vascular: No hyperdense vessel or unexpected calcification. Skull: Normal. Negative for fracture or focal lesion. Sinuses/Orbits: No acute finding. Other: None. IMPRESSION: Chronic white matter ischemic changes without acute abnormality. Electronically Signed   By: Inez Catalina M.D.   On: 05/21/2021 22:36   MR BRAIN WO CONTRAST  Result Date: 05/21/2021 CLINICAL DATA:  Transient ischemic attack, right-sided numbness EXAM: MRI HEAD WITHOUT CONTRAST TECHNIQUE: Multiplanar, multiecho pulse sequences of the brain and surrounding structures were obtained without intravenous contrast. COMPARISON:  No prior MRI, correlation is made with CT head 05/21/2021 FINDINGS: Brain: No restricted diffusion to suggest acute infarct. No acute hemorrhage, extra-axial collection, hydrocephalus, mass, mass effect, or midline shift. T2 hyperintense signal in the periventricular white matter, likely the sequela of chronic small vessel ischemic disease. Vascular: Normal flow voids. Skull and upper cervical spine: Normal marrow signal. Sinuses/Orbits: Negative. Other: The mastoids are well aerated. IMPRESSION: No acute intracranial process. Electronically Signed   By: Merilyn Baba M.D.   On: 05/21/2021 23:38    Procedures Procedures   Medications Ordered in ED Medications - No data to display  ED Course  I have reviewed the triage vital signs and  the nursing notes.  Pertinent labs & imaging results that were available during my care of the patient were reviewed by me and considered in my medical decision making (see chart for details).    MDM Rules/Calculators/A&P  Initial impression-presents with paresthesias of the right side.  He is alert, does not appear acute stress, vital signs noted for hypertension BP of 154/87.  Triage obtain basic lab work-up, presentation atypical of TIA but due to his increased risk factors will obtain CT head, MRI brain without contrast and reassess.  Work-up-CBC unremarkable, BMP shows hyperglycemia 108, TSH 3.2, EKG sinus without signs of ischemia.CT head negative for acute findings, MRI brain without acute findings.  Reassessment-patient was updated on laboratory imaging, he has no complaints this time, vital signs remained stable, he is agreeable for discharge at this time.    Rule out- Low suspicion for intracranial head bleed and or mass as CT imaging is negative for these findings.  I have low suspicion for CVA or TIA as presentation is atypical of etiology, no neurodeficits, MR imaging is negative for acute findings.. low suspicion for dissection of the vertebral/carotid artery as presentation is atypical of etiology, patient has low risk factors.  Low suspicion for meningitis as he has no meningeal sign, low risk factors, no leukocytosis.  Due to patient's age, presentation, I doubt this is MS.  I have low suspicion for metabolic abnormality has no acute electrolyte derailments present on BMP.  Plan-  Paresthesias-unclear etiology, will recommend that he follows up with neurology for further evaluation, given strict return precautions.  Vital signs have remained stable, no indication for hospital admission.  Patient discussed with attending and they agreed with assessment and plan.  Patient given at home care as well strict return precautions.  Patient verbalized that they  understood agreed to said plan.  Final Clinical Impression(s) / ED Diagnoses Final diagnoses:  Paresthesias    Rx / DC Orders ED Discharge Orders     None        Marcello Fennel, PA-C 05/21/21 2352    Isla Pence, MD 05/22/21 1655

## 2021-05-21 NOTE — Discharge Instructions (Addendum)
Lab work and imaging were all reassuring.  Please continue the home medication as prescribed.  Like to follow-up with neurology for further evaluation.  Come back to the emergency department if you develop chest pain, shortness of breath, severe abdominal pain, develop severe headache, develop worsening numbness and tingling, weakness, slurred speech, become off balance, dizziness

## 2021-05-21 NOTE — ED Provider Notes (Signed)
Emergency Medicine Provider Triage Evaluation Note  MENNO VANBERGEN , a 75 y.o. male  was evaluated in triage.  Pt complains of right-sided numbness.  Patient began with numbed, then with a numb arm, then his leg as well.  No weakness.  Started terbinafine recently. Hx of hypothyroidism   Review of Systems  Positive: Numbness, slight headache Negative: Weakness  Physical Exam  BP 137/83 (BP Location: Left Arm)   Pulse 66   Temp 97.9 F (36.6 C) (Oral)   Resp 18   SpO2 97%  Gen:   Awake, no distress   Resp:  Normal effort  MSK:   Moves extremities without difficulty  Other:  5 out of 5 strength in all extremities.  Negative NIH stroke  Medical Decision Making  Medically screening exam initiated at 1:21 PM.  Appropriate orders placed.  [name] was informed that the remainder of the evaluation will be completed by another provider, this initial triage assessment does not replace that evaluation, and the importance of remaining in the ED until their evaluation is complete.     Rhae Hammock, PA-C 05/21/21 1324    Daleen Bo, MD 05/22/21 1055

## 2021-05-21 NOTE — ED Notes (Signed)
Patient back from MRI.

## 2021-05-30 ENCOUNTER — Telehealth: Payer: Self-pay | Admitting: Gastroenterology

## 2021-05-30 NOTE — Telephone Encounter (Signed)
Received and reviewed labs from [facility] dated 12/21/2020.  CMP: Glucose 116 (H), BUN 11, creatinine 0.2, sodium 140, potassium 3.4 (L), albumin 3.7, alk phos 99, SGOT 19, ALT 38, T bili 0.8. Lipid panel: HDL 55, LDL 102 (H), triglycerides 82, total cholesterol 173. CBC: WBC 7.17, hemoglobin 15.0, hematocrit 45.8, MCV 92.9, MCH 30.4, MCHC 33.8, platelets 216. Vitamin D 25: 92.3.  No concerning abnormalities related to Lialda. No additional recommendations. Patient will continue to have routine blood work completed with the [place].

## 2021-08-06 ENCOUNTER — Other Ambulatory Visit: Payer: Self-pay

## 2021-08-06 ENCOUNTER — Emergency Department (HOSPITAL_COMMUNITY): Payer: No Typology Code available for payment source

## 2021-08-06 ENCOUNTER — Emergency Department (HOSPITAL_COMMUNITY)
Admission: EM | Admit: 2021-08-06 | Discharge: 2021-08-06 | Disposition: A | Discharge status: Home / Self Care | Payer: No Typology Code available for payment source | Attending: Student | Admitting: Student

## 2021-08-06 DIAGNOSIS — R209 Unspecified disturbances of skin sensation: Secondary | ICD-10-CM | POA: Insufficient documentation

## 2021-08-06 DIAGNOSIS — I10 Essential (primary) hypertension: Secondary | ICD-10-CM | POA: Insufficient documentation

## 2021-08-06 DIAGNOSIS — R079 Chest pain, unspecified: Secondary | ICD-10-CM

## 2021-08-06 DIAGNOSIS — E871 Hypo-osmolality and hyponatremia: Secondary | ICD-10-CM | POA: Diagnosis not present

## 2021-08-06 DIAGNOSIS — Z79899 Other long term (current) drug therapy: Secondary | ICD-10-CM | POA: Insufficient documentation

## 2021-08-06 DIAGNOSIS — R072 Precordial pain: Secondary | ICD-10-CM | POA: Insufficient documentation

## 2021-08-06 DIAGNOSIS — E876 Hypokalemia: Secondary | ICD-10-CM | POA: Insufficient documentation

## 2021-08-06 LAB — CBC
HCT: 46.6 % (ref 39.0–52.0)
Hemoglobin: 16 g/dL (ref 13.0–17.0)
MCH: 32 pg (ref 26.0–34.0)
MCHC: 34.3 g/dL (ref 30.0–36.0)
MCV: 93.2 fL (ref 80.0–100.0)
Platelets: 171 10*3/uL (ref 150–400)
RBC: 5 MIL/uL (ref 4.22–5.81)
RDW: 13.8 % (ref 11.5–15.5)
WBC: 9.7 10*3/uL (ref 4.0–10.5)
nRBC: 0 % (ref 0.0–0.2)

## 2021-08-06 LAB — BASIC METABOLIC PANEL
Anion gap: 10 (ref 5–15)
BUN: 9 mg/dL (ref 8–23)
CO2: 24 mmol/L (ref 22–32)
Calcium: 8.9 mg/dL (ref 8.9–10.3)
Chloride: 100 mmol/L (ref 98–111)
Creatinine, Ser: 1.16 mg/dL (ref 0.61–1.24)
GFR, Estimated: >60 mL/min (ref >60)
Glucose, Bld: 130 mg/dL — ABNORMAL HIGH (ref 70–99)
Potassium: 3.2 mmol/L — ABNORMAL LOW (ref 3.5–5.1)
Sodium: 134 mmol/L — ABNORMAL LOW (ref 135–145)

## 2021-08-06 LAB — TROPONIN I (HIGH SENSITIVITY)
Troponin I (High Sensitivity): 4 ng/L (ref <18)
Troponin I (High Sensitivity): 5 ng/L (ref <18)

## 2021-08-06 MED ORDER — IOHEXOL 350 MG/ML SOLN
60.0000 mL | Freq: Once | INTRAVENOUS | Status: AC | PRN
  Administered 2021-08-06: 60 mL via INTRAVENOUS

## 2021-08-06 NOTE — ED Provider Notes (Addendum)
[place] EMERGENCY DEPARTMENT Provider Note   CSN: [id number] Arrival date & time: 08/06/21  9767     History  Chief Complaint  Patient presents with   Chest Pain    Jerome Stark is a 76 y.o. male with PMH HTN, HLD, ulcerative colitis on mesalamine who presents emergency department for evaluation of chest pain.  Patient states that he has had 2 episodes of substernal chest pain that he describes as a burning sensation that starts at the top and radiates down into the right nipple.  No associated nausea, vomiting, diaphoresis or exertional component to the chest pain.  The patient apparently has been following with the cardiology team at the [facility] and had a negative stress test performed 2 months ago.  He states that his pain is resolved while I am speaking with the patient, but he has had 2 episodes while waiting in the lobby for the last 7 hours.   Chest Pain     Home Medications Prior to Admission medications   Medication Sig Start Date End Date Taking? Authorizing Provider  acetaminophen (TYLENOL) 500 MG tablet Take 1,500-2,000 mg by mouth every 6 (six) hours as needed for moderate pain or headache.    [provider]  amLODipine (NORVASC) 10 MG tablet Take 10 mg by mouth every morning. 12/18/14   [provider]  Ascorbic Acid (VITAMIN C PO) Take 500 mg by mouth daily.    [provider]  hydrochlorothiazide (HYDRODIURIL) 25 MG tablet Take 25 mg by mouth daily. 12/18/14   [provider]  levothyroxine (SYNTHROID, LEVOTHROID) 75 MCG tablet Take 75 mcg by mouth daily before breakfast.    [provider]  lisinopril (ZESTRIL) 20 MG tablet Take 20 mg by mouth daily. 03/15/21   [provider]  mesalamine (LIALDA) 1.2 G EC tablet Take 4 tablets (4.8 g total) by mouth daily with breakfast. 02/15/15   Carlis Stable, NP  terbinafine (LAMISIL) 250 MG tablet Take 250 mg by mouth daily.    [provider]  VITAMIN  D, CHOLECALCIFEROL, PO Take 1,000 Units by mouth daily.    [provider]  zinc gluconate 50 MG tablet Take 50 mg by mouth daily.    [provider]      Allergies    Patient has no known allergies.    Review of Systems   Review of Systems  Cardiovascular:  Positive for chest pain.   Physical Exam Updated Vital Signs BP (!) 132/97    Pulse 72    Temp 98.1 F (36.7 C)    Resp 11    Ht 5' 11"  (1.803 m)    Wt 90.7 kg    SpO2 98%    BMI 27.89 kg/m  Physical Exam Vitals and nursing note reviewed.  Constitutional:      General: He is not in acute distress.    Appearance: He is well-developed.  HENT:     Head: Normocephalic and atraumatic.  Eyes:     Conjunctiva/sclera: Conjunctivae normal.  Cardiovascular:     Rate and Rhythm: Normal rate and regular rhythm.     Heart sounds: No murmur heard. Pulmonary:     Effort: Pulmonary effort is normal. No respiratory distress.     Breath sounds: Normal breath sounds.  Abdominal:     Palpations: Abdomen is soft.     Tenderness: There is no abdominal tenderness.  Musculoskeletal:        General: No swelling.  Cervical back: Neck supple.  Skin:    General: Skin is warm and dry.     Capillary Refill: Capillary refill takes less than 2 seconds.  Neurological:     Mental Status: He is alert.  Psychiatric:        Mood and Affect: Mood normal.    ED Results / Procedures / Treatments   Labs (all labs ordered are listed, but only abnormal results are displayed) Labs Reviewed  BASIC METABOLIC PANEL - Abnormal; Notable for the following components:      Result Value   Sodium 134 (*)    Potassium 3.2 (*)    Glucose, Bld 130 (*)    All other components within normal limits  CBC  TROPONIN I (HIGH SENSITIVITY)  TROPONIN I (HIGH SENSITIVITY)    EKG EKG Interpretation  Date/Time:  Tuesday August 06 2021 09:23:21 EST Ventricular Rate:  95 PR Interval:  156 QRS Duration: 94 QT Interval:  362 QTC  Calculation: 454 R Axis:   -5 Text Interpretation: Sinus rhythm with Premature atrial complexes Possible Anterior infarct , age undetermined Abnormal ECG When compared with ECG of 21-May-2021 13:15, PREVIOUS ECG IS PRESENT Confirmed by Devika Dragovich ([id number]) on 08/06/2021 4:28:24 PM  Radiology DG Chest 2 View  Result Date: 08/06/2021 CLINICAL DATA:  Provided history: Chest pain. Additional history provided: Patient reports intermittent chest pain for 2 months. Associated shortness of breath. EXAM: CHEST - 2 VIEW COMPARISON:  Report from chest radiographs 04/14/2005 (images unavailable). FINDINGS: Heart size within normal limits. Aortic atherosclerosis. No appreciable airspace consolidation. No evidence of pleural effusion or pneumothorax. No acute bony abnormality identified. Degenerative changes of the spine. IMPRESSION: No evidence of acute cardiopulmonary abnormality. Aortic Atherosclerosis (ICD10-I70.0). Electronically Signed   By: Kellie Simmering D.O.   On: 08/06/2021 09:49   CT Angio Chest PE W and/or Wo Contrast  Result Date: 08/06/2021 CLINICAL DATA:  Chest pain for 1 month, short of breath for 2 weeks EXAM: CT ANGIOGRAPHY CHEST WITH CONTRAST TECHNIQUE: Multidetector CT imaging of the chest was performed using the standard protocol during bolus administration of intravenous contrast. Multiplanar CT image reconstructions and MIPs were obtained to evaluate the vascular anatomy. CONTRAST:  76m OMNIPAQUE IOHEXOL 350 MG/ML SOLN COMPARISON:  08/06/2021 FINDINGS: Cardiovascular: This is a technically adequate evaluation of the pulmonary vasculature. No filling defects or pulmonary emboli. The heart is unremarkable without pericardial effusion. Normal caliber of the thoracic aorta. Evaluation of the aortic lumen is limited due to timing of contrast bolus. Moderate atherosclerosis of the aorta and coronary vasculature. Mediastinum/Nodes: No enlarged mediastinal, hilar, or axillary lymph nodes. Thyroid gland,  trachea, and esophagus demonstrate no significant findings. Lungs/Pleura: Mild upper lobe predominant emphysema. No acute airspace disease, effusion, or pneumothorax. Central airways are widely patent. Upper Abdomen: No acute abnormality. Musculoskeletal: No acute or destructive bony lesions. Reconstructed images demonstrate no additional findings. Review of the MIP images confirms the above findings. IMPRESSION: 1. No evidence of pulmonary embolus. 2. No acute intrathoracic process. 3. Aortic Atherosclerosis (ICD10-I70.0) and Emphysema (ICD10-J43.9). Electronically Signed   By: MRanda NgoM.D.   On: 08/06/2021 17:54    Procedures Procedures   Cardiac monitor reviewed, no evidence of ST elevation, normal sinus rhythm  Medications Ordered in ED Medications  iohexol (OMNIPAQUE) 350 MG/ML injection 60 mL (60 mLs Intravenous Contrast Given 08/06/21 1735)    ED Course/ Medical Decision Making/ A&P  Medical Decision Making  Patient seen emergency department for evaluation of chest pain.  Physical exam is unremarkable with an unremarkable cardiopulmonary exam, unremarkable abdominal exam.  Laboratory evaluation with some mild hypokalemia, mild hyponatremia but is otherwise unremarkable.  Troponin and delta troponin negative.  Patient's heart score is 3.  CT PE obtained to rule out pulmonary embolism as the cause of the patient's chest pain and this was reassuringly negative.  On reevaluation, patient has no active chest pain and he is requesting outpatient follow-up with a cardiologist to discuss outpatient catheterization.  I spoke directly with cardiology who states that they will help facilitate outpatient follow-up in regards to the patient's previous dealings with the [place].  On reevaluation, stable vital signs stable in setting of no active chest pain, he is safe for discharge at this time with outpatient cardiology follow-up.  Patient then discharged.        Final  Clinical Impression(s) / ED Diagnoses Final diagnoses:  Chest pain, unspecified type    Rx / DC Orders ED Discharge Orders     None         Jendaya Gossett, Debe Coder, MD 08/06/21 4707    Teressa Lower, MD 08/06/21 2327

## 2021-08-06 NOTE — ED Notes (Signed)
Patient transported to CT 

## 2021-08-06 NOTE — ED Triage Notes (Addendum)
Patient complains of intermittent chest pain for the past 2 months. Has been seen by cardiology and has worn monitor for same and no diagnosis made. Reports some SOB with same. On arrival alert and oriented, denies CP Reports stress test and CT

## 2021-08-28 ENCOUNTER — Encounter (HOSPITAL_BASED_OUTPATIENT_CLINIC_OR_DEPARTMENT_OTHER): Payer: Self-pay | Admitting: Cardiology

## 2021-08-28 ENCOUNTER — Ambulatory Visit (HOSPITAL_BASED_OUTPATIENT_CLINIC_OR_DEPARTMENT_OTHER): Payer: Medicare Other | Admitting: Cardiology

## 2021-08-28 ENCOUNTER — Other Ambulatory Visit: Payer: Self-pay

## 2021-08-28 VITALS — BP 128/66 | HR 91 | Ht 71.0 in | Wt 198.6 lb

## 2021-08-28 DIAGNOSIS — I2510 Atherosclerotic heart disease of native coronary artery without angina pectoris: Secondary | ICD-10-CM

## 2021-08-28 DIAGNOSIS — R072 Precordial pain: Secondary | ICD-10-CM

## 2021-08-28 DIAGNOSIS — Z01812 Encounter for preprocedural laboratory examination: Secondary | ICD-10-CM

## 2021-08-28 DIAGNOSIS — Z8249 Family history of ischemic heart disease and other diseases of the circulatory system: Secondary | ICD-10-CM

## 2021-08-28 DIAGNOSIS — I700 Atherosclerosis of aorta: Secondary | ICD-10-CM | POA: Diagnosis not present

## 2021-08-28 DIAGNOSIS — Z79899 Other long term (current) drug therapy: Secondary | ICD-10-CM

## 2021-08-28 DIAGNOSIS — I10 Essential (primary) hypertension: Secondary | ICD-10-CM

## 2021-08-28 MED ORDER — METOPROLOL TARTRATE 100 MG PO TABS: ORAL_TABLET | ORAL | 0 refills | Status: DC

## 2021-08-28 MED ORDER — ROSUVASTATIN CALCIUM 5 MG PO TABS: 5.0000 mg | ORAL_TABLET | Freq: Every day | ORAL | 3 refills | Status: DC

## 2021-08-28 NOTE — Patient Instructions (Addendum)
Medication Instructions:  1) START: Rosuvastatin 5 mg daily 2) Take  Metoprolol 100 mg 2 hours prior to Cardiac CTA ONLY  *If you need a refill on your cardiac medications before your next appointment, please call your pharmacy*   Lab Work: Your provider has recommended lab work (fasting lipid). Please have this collected at [facility] at [place]. The lab is open 8:00 am - 4:30 pm. Please avoid 12:00p - 1:00p for lunch hour. You do not need an appointment. Please go to [address] [place], [place] [address]. This is in the Primary Care office on the 3rd floor, let them know you are there for blood work and they will direct you to the lab.  If you have labs (blood work) drawn today and your tests are completely normal, you will receive your results only by: [place] (if you have MyChart) OR A paper copy in the mail If you have any lab test that is abnormal or we need to change your treatment, we will call you to review the results.   Testing/Procedures: Cardiac CT Angiography (CTA), is a special type of CT scan that uses a computer to produce multi-dimensional views of major blood vessels throughout the body. In CT angiography, a contrast material is injected through an IV to help visualize the blood vessels [facility]    Follow-Up: At [facility], you and your health needs are our priority.  As part of our continuing mission to provide you with exceptional heart care, we have created designated Provider Care Teams.  These Care Teams include your primary Cardiologist (physician) and Advanced Practice Providers (APPs -  Physician Assistants and Nurse Practitioners) who all work together to provide you with the care you need, when you need it.  We recommend signing up for the patient portal called "MyChart".  Sign up information is provided on this After Visit Summary.  MyChart is used to connect with patients for Virtual Visits (Telemedicine).   Patients are able to view lab/test results, encounter notes, upcoming appointments, etc.  Non-urgent messages can be sent to your provider as well.   To learn more about what you can do with MyChart, go to [email or web address].    Your next appointment:   Based on test results  The format for your next appointment:   In Person  Provider:   Buford Dresser, MD     Your cardiac CT will be scheduled at one of the below locations:   [facility] [address] [place], [place] [address] [phone]  If scheduled at [facility], please arrive at the [facility] main entrance (entrance A) of [facility] 30 minutes prior to test start time. You can use the FREE valet parking offered at the main entrance (encouraged to control the heart rate for the test) Proceed to the [facility] Radiology Department (first floor) to check-in and test prep.  If scheduled at [facility], please arrive 15 mins early for check-in and test prep.  Please follow these instructions carefully (unless otherwise directed):  Hold all erectile dysfunction medications at least 3 days (72 hrs) prior to test.  On the Night Before the Test: Be sure to Drink plenty of water. Do not consume any caffeinated/decaffeinated beverages or chocolate 12 hours prior to your test. Do not take any antihistamines 12 hours prior to your test.  On the Day of the Test: Drink plenty of water until 1 hour prior to the  test. Do not eat any food 4 hours prior to the test. You may take your regular medications prior to the test.  Take metoprolol (Lopressor) 100 mg two hours prior to test. HOLD Hydrochlorothiazide morning of the test. FEMALES- please wear underwire-free bra if available, avoid dresses & tight clothing       After the Test: Drink plenty of water. After receiving IV contrast, you may experience a mild flushed feeling. This is normal. On occasion, you  may experience a mild rash up to 24 hours after the test. This is not dangerous. If this occurs, you can take Benadryl 25 mg and increase your fluid intake. If you experience trouble breathing, this can be serious. If it is severe call 911 IMMEDIATELY. If it is mild, please call our office. If you take any of these medications: Glipizide/Metformin, Avandament, Glucavance, please do not take 48 hours after completing test unless otherwise instructed.  We will call to schedule your test 2-4 weeks out understanding that some insurance companies will need an authorization prior to the service being performed.   For non-scheduling related questions, please contact the cardiac imaging nurse navigator should you have any questions/concerns: Marchia Bond, Cardiac Imaging Nurse Navigator Gordy Clement, Cardiac Imaging Nurse Navigator [facility] Direct Office Dial: [phone]   For scheduling needs, including cancellations and rescheduling, please call [place], [phone].

## 2021-08-28 NOTE — Progress Notes (Signed)
Cardiology Office Note:    Date:  08/28/2021   ID:  Jerome Stark, DOB 11/24/1945, MRN [id number]  PCP:  No primary care provider on file.  Cardiologist:  Buford Dresser, MD  Referring MD: Cleophas Dunker, MD   CC: new patient evaluation for chest pain  History of Present Illness:    Jerome Stark is a 76 y.o. male with a hx of hypertension, hyperlipidemia, hypothyroidism, and ulcerative colitis, who is seen for post hospital follow-up for chest pain.  Cardiac history: He presented to the ED 08/06/21 following 2 episodes of substernal chest pain described as a burning sensation radiating down to the right nipple. He had 2 more episodes while waiting in the lobby, but had no active chest pain during his evaluation. Troponin and delta troponin were negative, and his heart score was 3. CT PE was negative. He was discharged with outpatient cardiology follow-up.  Today, he is accompanied by his wife.  Cardiovascular risk factors: Prior clinical ASCVD: none Comorbid conditions: Hypertension - on antihypertensives for 25 years. He denies hyperlipidemia, diabetes, and CKD. Chronic inflammatory conditions: Ulcerative colitis, on mesalamine. Has not used steroidal treatment. Tobacco use history: Former smoker. Quit 30 years ago. Family history: His mother had CABG x4 at 85 yo. He has two brothers who both had stents placed, one of them developed memory loss following treatment. His 3rd youngest sibling (sister) had a stroke in her early 67's. Prior cardiac testing and/or incidental findings on other testing: Stress test at [facility] in [place] was reportedly unremarkable. Both aortic and coronary atherosclerosis noted on CTPE 08/06/21.  Chest pain: -Initial onset: 08/05/2021 -Quality: Initially woke up with severe chest pain and was gasping for breath. Currently his episodes consist of chest pressure. -Frequency: Daily, not nearly as severe as the episodes leading to his ED visit.   -Aggravating/alleviating factors: While working or walking he does not experience chest pain/pressure.  He endorses bilateral LE pitting edema that improves by the next morning. Typically this occurs by the end of the day for most days. He attributes his swelling to being on amlodipine.  Previously, he was on a daily 81 mg aspirin regimen. 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  Of note, his medical history includes surgeries (years apart) for collapsed lung. This occurred 2 times unilaterally, once for each lung.   He denies any palpitations, or shortness of breath. No lightheadedness, headaches, syncope, orthopnea, PND, or exertional symptoms.  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 Britta Mccreedy: diminutive hyperplastic polyp. scope advanced to the cecum, no intubation of TI, no random biopsies performed   COLONOSCOPY N/A 11/29/2014   Dr. Gala Romney: left-sided proctocolitis    KNEE ARTHROPLASTY Right 01/08/2017   Procedure: RIGHT TOTAL KNEE ARTHROPLASTY WITH COMPUTER NAVIGATION;  Surgeon: Rod Can, MD;  Location: [facility];  Service: Orthopedics;  Laterality: Right;  Needs RNFA; Adductor block   KNEE ARTHROPLASTY Left 02/26/2017   Procedure: LEFT TOTAL KNEE ARTHROPLASTY;  Surgeon: Rod Can, MD;  Location: [facility]S;  Service: Orthopedics;  Laterality: Left;  Needs RNFA   LUNG SURGERY  collapsed lung; bilateral per patient "they called it a bleb that burst"     Current Medications: Current Outpatient Medications on File Prior to Visit  Medication Sig   acetaminophen (TYLENOL) 500 MG tablet Take 1,500-2,000 mg by mouth every 6 (six) hours as needed for  moderate pain or headache.   amLODipine (NORVASC) 10 MG tablet Take 10 mg by mouth every morning.   Ascorbic Acid (VITAMIN C PO) Take 500 mg by mouth daily.   hydrochlorothiazide (HYDRODIURIL) 25 MG tablet Take 25 mg by mouth daily.   levothyroxine (SYNTHROID, LEVOTHROID) 75 MCG tablet Take 75 mcg by mouth daily before breakfast.   lisinopril (ZESTRIL) 20 MG tablet Take 20 mg by mouth daily.   mesalamine (LIALDA) 1.2 G EC tablet Take 4 tablets (4.8 g total) by mouth daily with breakfast.   terbinafine (LAMISIL) 250 MG tablet Take 250 mg by mouth daily.   VITAMIN D, CHOLECALCIFEROL, PO Take 1,000 Units by mouth daily.   zinc gluconate 50 MG tablet Take 50 mg by mouth daily.   No current facility-administered medications on file prior to visit.     Allergies:   Patient has no known allergies.   Social History   Tobacco Use   Smoking status: Former    Types: Cigarettes    Quit date: 02/14/1985    Years since quitting: 36.5   Smokeless tobacco: Never   Tobacco comments:    smoked 1 ppd for 25 years, quit at age 11  Vaping Use   Vaping Use: Never used  Substance Use Topics   Alcohol use: Yes    Alcohol/week: 0.0 standard drinks    Comment: occasional wine   Drug use: No    Family History: family history includes Heart attack in his brother and maternal grandfather; Heart disease in his mother; Hypertension in his mother and another family member. There is no history of Colon cancer or Inflammatory bowel disease.  ROS:   Please see the history of present illness.  Additional pertinent ROS: Constitutional: Negative for chills, fever, night sweats, unintentional weight loss  HENT: Negative for ear pain and hearing loss.   Eyes: Negative for loss of vision and eye pain.  Respiratory: Negative for cough, sputum, wheezing.   Cardiovascular: See HPI. Gastrointestinal: Negative for abdominal pain, melena, and hematochezia.  Genitourinary: Negative for dysuria and hematuria.   Musculoskeletal: Negative for falls and myalgias.  Skin: Negative for itching and rash.  Neurological: Negative for focal weakness, focal sensory changes and loss of consciousness.  Endo/Heme/Allergies: Does not bruise/bleed easily.     EKGs/Labs/Other Studies Reviewed:    The following studies were reviewed today:  CTA Chest 08/06/2021: COMPARISON:  08/06/2021   FINDINGS: Cardiovascular: This is a technically adequate evaluation of the pulmonary vasculature. No filling defects or pulmonary emboli.   The heart is unremarkable without pericardial effusion. Normal caliber of the thoracic aorta. Evaluation of the aortic lumen is limited due to timing of contrast bolus. Moderate atherosclerosis of the aorta and coronary vasculature.   Mediastinum/Nodes: No enlarged mediastinal, hilar, or axillary lymph nodes. Thyroid gland, trachea, and esophagus demonstrate no significant findings.   Lungs/Pleura: Mild upper lobe predominant emphysema. No acute airspace disease, effusion, or pneumothorax. Central airways are widely patent.   Upper Abdomen: No acute abnormality.   Musculoskeletal: No acute or destructive bony lesions. Reconstructed images demonstrate no additional findings.   Review of the MIP images confirms the above findings.   IMPRESSION: 1. No evidence of pulmonary embolus. 2. No  acute intrathoracic process. 3. Aortic Atherosclerosis (ICD10-I70.0) and Emphysema (ICD10-J43.9).  EKG:  EKG is personally reviewed.   08/06/2021: SR, PACs, borderline R wave progression  Recent Labs: 05/21/2021: TSH 3.290 08/06/2021: BUN 9; Creatinine, Ser 1.16; Hemoglobin 16.0; Platelets 171; Potassium 3.2; Sodium 134   Recent Lipid Panel No results found for: CHOL, TRIG, HDL, CHOLHDL, VLDL, LDLCALC, LDLDIRECT  Physical Exam:    VS:  BP 128/66 (BP Location: Right Arm, Patient Position: Sitting, Cuff Size: Normal)    Pulse 91    Ht 5' 11"  (1.803 m)    Wt 198 lb 9.6 oz (90.1 kg)    SpO2 97%     BMI 27.70 kg/m     Wt Readings from Last 3 Encounters:  08/28/21 198 lb 9.6 oz (90.1 kg)  08/06/21 199 lb 15.3 oz (90.7 kg)  04/25/21 200 lb (90.7 kg)    GEN: Well nourished, well developed in no acute distress HEENT: Normal, moist mucous membranes NECK: No JVD CARDIAC: regular rhythm, normal S1 and S2, no rubs or gallops. No murmur. VASCULAR: Radial and DP pulses 2+ bilaterally. No carotid bruits RESPIRATORY:  Clear to auscultation without rales, wheezing or rhonchi  ABDOMEN: Soft, non-tender, non-distended MUSCULOSKELETAL:  Ambulates independently SKIN: Warm and dry, no edema NEUROLOGIC:  Alert and oriented x 3. No focal neuro deficits noted. PSYCHIATRIC:  Normal affect    ASSESSMENT:    1. Precordial pain   2. Coronary artery calcification seen on CT scan   3. Aortic atherosclerosis ([place])   4. Essential hypertension   5. Family history of heart disease   6. Medication management   7. Pre-procedure lab exam    PLAN:    Precordial pain Coronary artery calcification Aortic atherosclerosis Family history of heart disease -reviewed his ER visit and workup today -we discussed options for further evaluation. After shared decision making, will pursue CT coronary angiography for further evaluation -ordered one time dose of metoprolol 100 mg 2 hours prior to test -had recent BMET -counseled on use of NG and importance to a good test -counseled on red flag warning signs that need immediate medical attention -discussed aspirin and statin today. He has had bleeding issues in the past, will defer aspirin. Will get baseline lipids and start rosuvastatin 5 mg daily today. May need to intensify based on results of CT  Hypertension: at goal today -continue amlodipine, HCTZ, lisinopril  Cardiac risk counseling and prevention recommendations: -recommend heart healthy/Mediterranean diet, with whole grains, fruits, vegetable, fish, lean meats, nuts, and olive oil. Limit  salt. -recommend moderate walking, 3-5 times/week for 30-50 minutes each session. Aim for at least 150 minutes.week. Goal should be pace of 3 miles/hours, or walking 1.5 miles in 30 minutes -recommend avoidance of tobacco products. Avoid excess alcohol. -ASCVD risk score: The ASCVD Risk score (Arnett DK, et al., 2019) failed to calculate for the following reasons:   Cannot find a previous HDL lab   Cannot find a previous total cholesterol lab    Plan for follow up: TBD based on results of testing.  Buford Dresser, MD, PhD, [facility]    Medication Adjustments/Labs and Tests Ordered: Current medicines are reviewed at length with the patient today.  Concerns regarding medicines are outlined above.   Orders Placed This Encounter  Procedures   CT CORONARY MORPH W/CTA COR W/SCORE W/CA W/CM &/OR WO/CM   Lipid panel   Meds ordered this encounter  Medications   rosuvastatin (CRESTOR) 5 MG tablet  Sig: Take 1 tablet (5 mg total) by mouth daily.    Dispense:  90 tablet    Refill:  3   metoprolol tartrate (LOPRESSOR) 100 MG tablet    Sig: TAKE 1 TABLET 2 HR PRIOR TO CARDIAC PROCEDURE    Dispense:  1 tablet    Refill:  0   Patient Instructions  Medication Instructions:  1) START: Rosuvastatin 5 mg daily 2) Take  Metoprolol 100 mg 2 hours prior to Cardiac CTA ONLY  *If you need a refill on your cardiac medications before your next appointment, please call your pharmacy*   Lab Work: Your provider has recommended lab work (fasting lipid). Please have this collected at [facility] [place]. The lab is open 8:00 am - 4:30 pm. Please avoid 12:00p - 1:00p for lunch hour. You do not need an appointment. Please go to [address] [place], [place] [address]. This is in the Primary Care office on the 3rd floor, let them know you are there for blood work and they will direct you to the lab.  If you have labs (blood work) drawn today and  your tests are completely normal, you will receive your results only by: [place] (if you have MyChart) OR A paper copy in the mail If you have any lab test that is abnormal or we need to change your treatment, we will call you to review the results.   Testing/Procedures: Cardiac CT Angiography (CTA), is a special type of CT scan that uses a computer to produce multi-dimensional views of major blood vessels throughout the body. In CT angiography, a contrast material is injected through an IV to help visualize the blood vessels [facility]    Follow-Up: At [facility], you and your health needs are our priority.  As part of our continuing mission to provide you with exceptional heart care, we have created designated Provider Care Teams.  These Care Teams include your primary Cardiologist (physician) and Advanced Practice Providers (APPs -  Physician Assistants and Nurse Practitioners) who all work together to provide you with the care you need, when you need it.  We recommend signing up for the patient portal called "MyChart".  Sign up information is provided on this After Visit Summary.  MyChart is used to connect with patients for Virtual Visits (Telemedicine).  Patients are able to view lab/test results, encounter notes, upcoming appointments, etc.  Non-urgent messages can be sent to your provider as well.   To learn more about what you can do with MyChart, go to [email or web address].    Your next appointment:   Based on test results  The format for your next appointment:   In Person  Provider:   Buford Dresser, MD     Your cardiac CT will be scheduled at one of the below locations:   [facility] [address] [place], [place] [address] [phone]  If scheduled at [facility], please arrive at the [facility] main entrance (entrance A) of [facility] 30 minutes prior to test start time. You can use the FREE valet  parking offered at the main entrance (encouraged to control the heart rate for the test) Proceed to the [facility] Radiology Department (first floor) to check-in and test prep.  If scheduled at [facility], please arrive 15 mins early for check-in and test prep.  Please follow these instructions carefully (unless otherwise directed):  Hold all erectile dysfunction medications at least  3 days (72 hrs) prior to test.  On the Night Before the Test: Be sure to Drink plenty of water. Do not consume any caffeinated/decaffeinated beverages or chocolate 12 hours prior to your test. Do not take any antihistamines 12 hours prior to your test.  On the Day of the Test: Drink plenty of water until 1 hour prior to the test. Do not eat any food 4 hours prior to the test. You may take your regular medications prior to the test.  Take metoprolol (Lopressor) 100 mg two hours prior to test. HOLD Hydrochlorothiazide morning of the test. FEMALES- please wear underwire-free bra if available, avoid dresses & tight clothing       After the Test: Drink plenty of water. After receiving IV contrast, you may experience a mild flushed feeling. This is normal. On occasion, you may experience a mild rash up to 24 hours after the test. This is not dangerous. If this occurs, you can take Benadryl 25 mg and increase your fluid intake. If you experience trouble breathing, this can be serious. If it is severe call 911 IMMEDIATELY. If it is mild, please call our office. If you take any of these medications: Glipizide/Metformin, Avandament, Glucavance, please do not take 48 hours after completing test unless otherwise instructed.  We will call to schedule your test 2-4 weeks out understanding that some insurance companies will need an authorization prior to the service being performed.   For non-scheduling related questions, please contact the cardiac imaging nurse navigator should you have any  questions/concerns: Marchia Bond, Cardiac Imaging Nurse Navigator Gordy Clement, Cardiac Imaging Nurse Navigator [facility] Direct Office Dial: [phone]   For scheduling needs, including cancellations and rescheduling, please call [place], [phone].     I,Mathew Stumpf,acting as a Education administrator for [employer], MD.,have documented all relevant documentation on the behalf of Buford Dresser, MD,as directed by  Buford Dresser, MD while in the presence of Buford Dresser, MD.  I, Buford Dresser, MD, have reviewed all documentation for this visit. The documentation on 08/28/21 for the exam, diagnosis, procedures, and orders are all accurate and complete.   Signed, Buford Dresser, MD PhD 08/28/2021 1:22 PM    [facility]

## 2021-08-29 LAB — LIPID PANEL
Chol/HDL Ratio: 3.4 ratio (ref 0.0–5.0)
Cholesterol, Total: 161 mg/dL (ref 100–199)
HDL: 47 mg/dL (ref >39)
LDL Chol Calc (NIH): 96 mg/dL (ref 0–99)
Triglycerides: 97 mg/dL (ref 0–149)
VLDL Cholesterol Cal: 18 mg/dL (ref 5–40)

## 2021-09-04 ENCOUNTER — Telehealth (HOSPITAL_COMMUNITY): Payer: Self-pay | Admitting: *Deleted

## 2021-09-04 NOTE — Telephone Encounter (Signed)
Reaching out to patient to offer assistance regarding upcoming cardiac imaging study; pt verbalizes understanding of appt date/time, parking situation and where to check in, pre-test NPO status and medications ordered, and verified current allergies; name and call back number provided for further questions should they arise  Jerome Clement RN Navigator Cardiac [place] [facility] [phone] office [phone] cell  Patient to take 164m metoprolol tartrate two hours prior to cardiac CT scan. He is aware to arrive at 12:30pm for his 1pm scan.

## 2021-09-05 ENCOUNTER — Encounter (HOSPITAL_COMMUNITY): Payer: Self-pay

## 2021-09-05 ENCOUNTER — Ambulatory Visit (HOSPITAL_COMMUNITY)
Admission: RE | Admit: 2021-09-05 | Discharge: 2021-09-05 | Disposition: A | Discharge status: Home / Self Care | Payer: Medicare Other | Source: Ambulatory Visit | Attending: Cardiology | Admitting: Cardiology

## 2021-09-05 ENCOUNTER — Other Ambulatory Visit: Payer: Self-pay

## 2021-09-05 ENCOUNTER — Other Ambulatory Visit: Payer: Self-pay | Admitting: Cardiology

## 2021-09-05 DIAGNOSIS — R931 Abnormal findings on diagnostic imaging of heart and coronary circulation: Secondary | ICD-10-CM

## 2021-09-05 DIAGNOSIS — R072 Precordial pain: Secondary | ICD-10-CM | POA: Insufficient documentation

## 2021-09-05 DIAGNOSIS — I2510 Atherosclerotic heart disease of native coronary artery without angina pectoris: Secondary | ICD-10-CM

## 2021-09-05 MED ORDER — IOHEXOL 350 MG/ML SOLN
95.0000 mL | Freq: Once | INTRAVENOUS | Status: AC | PRN
  Administered 2021-09-05: 95 mL via INTRAVENOUS

## 2021-09-05 MED ORDER — NITROGLYCERIN 0.4 MG SL SUBL
SUBLINGUAL_TABLET | SUBLINGUAL | Status: AC
  Filled 2021-09-05: qty 2

## 2021-09-05 MED ORDER — NITROGLYCERIN 0.4 MG SL SUBL
0.8000 mg | SUBLINGUAL_TABLET | Freq: Once | SUBLINGUAL | Status: AC
  Administered 2021-09-05: 0.8 mg via SUBLINGUAL

## 2021-09-09 ENCOUNTER — Telehealth (HOSPITAL_BASED_OUTPATIENT_CLINIC_OR_DEPARTMENT_OTHER): Payer: Self-pay | Admitting: Cardiology

## 2021-09-09 NOTE — Telephone Encounter (Signed)
Patient has not heard the results of his Cardiac CT that was done 09/05/21.

## 2021-09-13 NOTE — Telephone Encounter (Signed)
Pt updated and verbalized understanding.

## 2021-09-17 ENCOUNTER — Other Ambulatory Visit: Payer: Self-pay

## 2021-09-17 ENCOUNTER — Encounter (HOSPITAL_BASED_OUTPATIENT_CLINIC_OR_DEPARTMENT_OTHER): Payer: Self-pay | Admitting: Cardiology

## 2021-09-17 ENCOUNTER — Ambulatory Visit (HOSPITAL_BASED_OUTPATIENT_CLINIC_OR_DEPARTMENT_OTHER): Payer: Medicare Other | Admitting: Cardiology

## 2021-09-17 VITALS — BP 124/68 | HR 66 | Ht 71.0 in | Wt 197.9 lb

## 2021-09-17 DIAGNOSIS — I2510 Atherosclerotic heart disease of native coronary artery without angina pectoris: Secondary | ICD-10-CM | POA: Diagnosis not present

## 2021-09-17 DIAGNOSIS — I25118 Atherosclerotic heart disease of native coronary artery with other forms of angina pectoris: Secondary | ICD-10-CM

## 2021-09-17 DIAGNOSIS — I10 Essential (primary) hypertension: Secondary | ICD-10-CM | POA: Diagnosis not present

## 2021-09-17 DIAGNOSIS — Z7189 Other specified counseling: Secondary | ICD-10-CM

## 2021-09-17 DIAGNOSIS — I700 Atherosclerosis of aorta: Secondary | ICD-10-CM

## 2021-09-17 DIAGNOSIS — Z8249 Family history of ischemic heart disease and other diseases of the circulatory system: Secondary | ICD-10-CM

## 2021-09-17 MED ORDER — METOPROLOL SUCCINATE ER 25 MG PO TB24: 25.0000 mg | ORAL_TABLET | Freq: Every day | ORAL | 3 refills | Status: DC

## 2021-09-17 MED ORDER — NITROGLYCERIN 0.4 MG SL SUBL: 0.4000 mg | SUBLINGUAL_TABLET | SUBLINGUAL | 3 refills | Status: DC | PRN

## 2021-09-17 MED ORDER — ROSUVASTATIN CALCIUM 20 MG PO TABS
20.0000 mg | ORAL_TABLET | Freq: Every day | ORAL | 3 refills | Status: DC
Start: 2021-09-17 — End: 2024-05-23

## 2021-09-17 NOTE — Progress Notes (Signed)
Cardiology Office Note:    Date:  09/17/2021   ID:  Jerome Stark, DOB 28-Aug-1945, MRN [id number]  PCP:  No primary care provider on file.  Cardiologist:  Buford Dresser, MD  CC: follow up  History of Present Illness:    Jerome Stark is a 76 y.o. male with a hx of hypertension, hyperlipidemia, hypothyroidism, and ulcerative colitis, who is seen for follow-up. He was initially seen 08/28/21 for post hospital follow-up for chest pain.  Cardiac history: He presented to the ED 08/06/21 following 2 episodes of substernal chest pain described as a burning sensation radiating down to the right nipple. He had 2 more episodes while waiting in the lobby, but had no active chest pain during his evaluation. Troponin and delta troponin were negative, and his heart score was 3. CT PE was negative. He was discharged with outpatient cardiology follow-up.  Today: He is accompanied by his wife and doing well. He continues to feel intermittent heaviness in his central chest. The heaviness is unchanged from his last appointment. He describes the heaviness as his "heart hitting hard". These episodes are brief and occur randomly.   We discussed the results of his recent CT scan. He is open to increasing his rosuvastatin. He had taken aspirin in the past but it was discontinued due to burst blood vessels. He prefers trying medication first rather than undergoing a catheterization.   He has gained weight recently. After eating a large dinner, he woke up in the middle of the night gasping for air. He wonders if the weight gain could be increasing the pressure in his chest.   He endorses daily LE swelling. The swelling occurs later in the day and improves the next morning.   His younger brother had a stent placed and was on a statin several years ago. His mother had open heart surgery when she was 75 years old.  Denies shortness of breath at rest or with normal exertion. No PND, or orthopnea. No syncope or  palpitations.  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 Britta Mccreedy: diminutive hyperplastic polyp. scope advanced to the cecum, no intubation of TI, no random biopsies performed   COLONOSCOPY N/A 11/29/2014   Dr. Gala Romney: left-sided proctocolitis    KNEE ARTHROPLASTY Right 01/08/2017   Procedure: RIGHT TOTAL KNEE ARTHROPLASTY WITH COMPUTER NAVIGATION;  Surgeon: Rod Can, MD;  Location: [facility];  Service: Orthopedics;  Laterality: Right;  Needs RNFA; Adductor block   KNEE ARTHROPLASTY Left 02/26/2017   Procedure: LEFT TOTAL KNEE ARTHROPLASTY;  Surgeon: Rod Can, MD;  Location: [facility];  Service: Orthopedics;  Laterality: Left;  Needs RNFA   LUNG SURGERY     collapsed lung; bilateral per patient "they called it a bleb that burst"     Current Medications: Current Outpatient Medications on File Prior to Visit  Medication Sig   acetaminophen (TYLENOL) 500 MG tablet Take 1,500-2,000 mg by mouth every 6 (six) hours as needed for moderate pain or headache.   amLODipine (NORVASC) 10 MG tablet Take 10 mg by mouth every morning.   Ascorbic Acid (VITAMIN C PO) Take  500 mg by mouth daily.   hydrochlorothiazide (HYDRODIURIL) 25 MG tablet Take 25 mg by mouth daily.   levothyroxine (SYNTHROID, LEVOTHROID) 75 MCG tablet Take 75 mcg by mouth daily before breakfast.   lisinopril (ZESTRIL) 20 MG tablet Take 20 mg by mouth daily.   mesalamine (LIALDA) 1.2 G EC tablet Take 4 tablets (4.8 g total) by mouth daily with breakfast.   terbinafine (LAMISIL) 250 MG tablet Take 250 mg by mouth daily.   VITAMIN D, CHOLECALCIFEROL, PO Take 1,000 Units by mouth daily.   zinc gluconate 50 MG tablet Take 50 mg by mouth daily.    No current facility-administered medications on file prior to visit.     Allergies:   Patient has no known allergies.   Social History   Tobacco Use   Smoking status: Former    Types: Cigarettes    Quit date: 02/14/1985    Years since quitting: 36.6   Smokeless tobacco: Never   Tobacco comments:    smoked 1 ppd for 25 years, quit at age 39  Vaping Use   Vaping Use: Never used  Substance Use Topics   Alcohol use: Yes    Alcohol/week: 0.0 standard drinks    Comment: occasional wine   Drug use: No    Family History: family history includes Heart attack in his brother and maternal grandfather; Heart disease in his mother; Hypertension in his mother and another family member. There is no history of Colon cancer or Inflammatory bowel disease.  ROS:   Please see the history of present illness.  Additional pertinent ROS: (+) Chest pressure/pain (+) Weight gain (+) Bilateral LE edema  EKGs/Labs/Other Studies Reviewed:    The following studies were reviewed today: CT Coronary FFR 09/05/21 FINDINGS: FFRct analysis was performed on the original cardiac CT angiogram dataset. Diagrammatic representation of the FFRct analysis is provided in a separate PDF document in PACS. This dictation was created using the PDF document and an interactive 3D model of the results. 3D model is not available in the EMR/PACS. Normal FFR range is >0.80.   1. Left Main: findings 1.00, 1.00 1.00   2. LAD: findings 0.93, 0.74 0.67   3. LCX: findings 0.97, 0.97   4. OM: findings 0.95, 0.91   5. RCA: findings 0.98, 0.94 0.89   IMPRESSION: FFR suggests mid LAD is significant/flow limiting  CT Coronary with Calcium Score 2/2/233: 1. Coronary calcium score of 875. This was 109 percentile for age-, sex, and race-matched controls.   2. Normal coronary origin with right dominance.   3. Moderate (50-69) mixed plaque stenosis in the mid LAD; remainder of CAD is mild.   4. Aortic atherosclerosis.    5. Study will be sent for FFR.  CTA Chest 08/06/2021: COMPARISON:  08/06/2021   FINDINGS: Cardiovascular: This is a technically adequate evaluation of the pulmonary vasculature. No filling defects or pulmonary emboli.   The heart is unremarkable without pericardial effusion. Normal caliber of the thoracic aorta. Evaluation of the aortic lumen is limited due to timing of contrast bolus. Moderate atherosclerosis of the aorta and coronary vasculature.   Mediastinum/Nodes: No enlarged mediastinal, hilar, or axillary lymph nodes. Thyroid gland, trachea, and esophagus demonstrate no significant findings.   Lungs/Pleura: Mild upper lobe predominant emphysema. No acute airspace disease, effusion, or pneumothorax. Central airways are widely patent.   Upper Abdomen: No acute abnormality.   Musculoskeletal: No acute or destructive bony lesions. Reconstructed images demonstrate no additional findings.   Review of the MIP images  confirms the above findings.   IMPRESSION: 1. No evidence of pulmonary embolus. 2. No acute intrathoracic process. 3. Aortic Atherosclerosis (ICD10-I70.0) and Emphysema (ICD10-J43.9).  EKG:  EKG is personally reviewed.   09/17/21: SR at 66 bpm, PVC 08/06/2021: SR, PACs, borderline R wave progression  Recent Labs: 05/21/2021: TSH 3.290 08/06/2021: BUN 9; Creatinine, Ser 1.16; Hemoglobin 16.0; Platelets 171; Potassium 3.2; Sodium 134   Recent Lipid Panel    Component Value Date/Time   CHOL 161 08/29/2021 0859   TRIG 97 08/29/2021 0859   HDL 47 08/29/2021 0859   CHOLHDL 3.4 08/29/2021 0859   LDLCALC 96 08/29/2021 0859    Physical Exam:    VS:  BP 124/68    Pulse 66    Ht 5' 11"  (1.803 m)    Wt 197 lb 14.4 oz (89.8 kg)    SpO2 98%    BMI 27.60 kg/m     Wt Readings from Last 3 Encounters:  09/17/21 197 lb 14.4 oz (89.8 kg)  08/28/21 198 lb 9.6 oz (90.1 kg)  08/06/21 199 lb 15.3 oz (90.7 kg)    GEN: Well nourished, well developed in no acute  distress HEENT: Normal, moist mucous membranes NECK: No JVD CARDIAC: regular rhythm, normal S1 and S2, no rubs or gallops. No murmur. VASCULAR: Radial and DP pulses 2+ bilaterally. No carotid bruits RESPIRATORY:  Clear to auscultation without rales, wheezing or rhonchi  ABDOMEN: Soft, non-tender, non-distended MUSCULOSKELETAL:  Ambulates independently SKIN: Warm and dry, no edema NEUROLOGIC:  Alert and oriented x 3. No focal neuro deficits noted. PSYCHIATRIC:  Normal affect   ASSESSMENT:    1. Coronary artery disease involving native coronary artery of native heart with other form of angina pectoris ([place])   2. Coronary artery calcification seen on CT scan   3. Aortic atherosclerosis ([place])   4. Essential hypertension   5. Family history of heart disease   6. Counseling on health promotion and disease prevention     PLAN:    Coronary artery disease, with atypical angina Aortic atherosclerosis Family history of heart disease -we reviewed his CT and FFR today and reviewed actual images -we discussed recommendations re: medical management vs cath. He has atypical symptoms, not worsening but not clearly exertional. We discussed that there may be multiple components to his symptoms -will intensify statin today  -we discussed aspirin. He has history of bleeding. Understands that if he gets a stent in the future, he would need DAPT, but he would like to hold on aspirin for now -we discussed red flag warning signs that need immediate medical attention -we discussed antianginals. He is on amlodipine and has done well with this. We discussed nitrates, but we reviewed interactions with PDE5 inhibitors, he would like to avoid. We will start low dose beta blocker today -discussed how to use SL NG, discussed risk with PDE5 inhibitors  Hypertension: at goal today -continue amlodipine, HCTZ, lisinopril. Starting beta blocker as antianginal  Cardiac risk counseling and prevention  recommendations: -recommend heart healthy/Mediterranean diet, with whole grains, fruits, vegetable, fish, lean meats, nuts, and olive oil. Limit salt. -recommend moderate walking, 3-5 times/week for 30-50 minutes each session. Aim for at least 150 minutes.week. Goal should be pace of 3 miles/hours, or walking 1.5 miles in 30 minutes -recommend avoidance of tobacco products. Avoid excess alcohol.  Plan for follow up: 3 months or sooner PRN  Buford Dresser, MD, PhD, [facility]    Medication Adjustments/Labs and Tests Ordered:  Current medicines are reviewed at length with the patient today.  Concerns regarding medicines are outlined above.   Orders Placed This Encounter  Procedures   EKG 12-Lead   Meds ordered this encounter  Medications   rosuvastatin (CRESTOR) 20 MG tablet    Sig: Take 1 tablet (20 mg total) by mouth daily.    Dispense:  90 tablet    Refill:  3    Replaces current dose   metoprolol succinate (TOPROL XL) 25 MG 24 hr tablet    Sig: Take 1 tablet (25 mg total) by mouth daily.    Dispense:  90 tablet    Refill:  3   nitroGLYCERIN (NITROSTAT) 0.4 MG SL tablet    Sig: Place 1 tablet (0.4 mg total) under the tongue every 5 (five) minutes as needed for chest pain.    Dispense:  90 tablet    Refill:  3   Patient Instructions  We will increase the rosuvastatin to 20 mg (check your bottle at home) daily. We will start metoprolol succinate daily (can take morning or evening). I will also give you a bottle of nitroglycerin. Do NOT take this if you have taken Viagra in the last 2 days.   I,Mykaella Javier,acting as a Education administrator for [employer], MD.,have documented all relevant documentation on the behalf of Buford Dresser, MD,as directed by  Buford Dresser, MD while in the presence of Buford Dresser, MD.  I, Buford Dresser, MD, have reviewed all documentation for this visit. The documentation on 09/20/21 for the  exam, diagnosis, procedures, and orders are all accurate and complete.   Signed, Buford Dresser, MD PhD 09/17/2021     [place]

## 2021-09-17 NOTE — Patient Instructions (Signed)
Medication Instructions:  1) We will increase the rosuvastatin to 20 mg (check your bottle at home) daily.  2) We will start metoprolol succinate 25 mg daily (can take morning or evening). 3) I will also give you a bottle of nitroglycerin. Do NOT take this if you have taken Viagra in the last 2 days.  *If you need a refill on your cardiac medications before your next appointment, please call your pharmacy*   Lab Work: None ordered today   Testing/Procedures: None ordered today   Follow-Up: At [facility], you and your health needs are our priority.  As part of our continuing mission to provide you with exceptional heart care, we have created designated Provider Care Teams.  These Care Teams include your primary Cardiologist (physician) and Advanced Practice Providers (APPs -  Physician Assistants and Nurse Practitioners) who all work together to provide you with the care you need, when you need it.  We recommend signing up for the patient portal called "MyChart".  Sign up information is provided on this After Visit Summary.  MyChart is used to connect with patients for Virtual Visits (Telemedicine).  Patients are able to view lab/test results, encounter notes, upcoming appointments, etc.  Non-urgent messages can be sent to your provider as well.   To learn more about what you can do with MyChart, go to [email or web address].    Your next appointment:   3 month(s)  The format for your next appointment:   In Person  Provider:   Buford Dresser, MD

## 2021-09-20 ENCOUNTER — Encounter (HOSPITAL_BASED_OUTPATIENT_CLINIC_OR_DEPARTMENT_OTHER): Payer: Self-pay | Admitting: Cardiology

## 2021-10-21 NOTE — Progress Notes (Deleted)
? ? ?Referring Provider: Cleophas Dunker, MD ?Primary Care Physician:  No primary care provider on file. ?Primary GI Physician: Dr. Gala Romney ? ?No chief complaint on file. ? ? ?HPI:   ?Jerome Stark is a 76 y.o. 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  ? ?Last seen in our office 04/25/2021.  Clinically, he was doing very well on the out of.  No alarm symptoms.  Reported vitamin D deficiency and just recently resumed taking vitamin D.  Recommended continuing Lialda, vitamin D, and would request recent blood work from [place]. ? ?Received labs from the [place] dated 12/21/2020.  Creatinine, electrolytes, LFTs, hemoglobin within normal limits.  Vitamin D, 25 hydroxy 92.3. ? ?Today: ? ? ?Denies NSAIDs.*** ?Denies tobacco use. *** ? ?Bone density scan (over 60 years): ? ?Vaccines:  ?Influenza:  ?Pneumococcal:  ? ?Due for surveillance colonoscopy in 2024 (8 years after diagnosis) ? ?Past Medical History:  ?Diagnosis Date  ? Hyperlipidemia   ? Hypertension   ? Hypothyroidism   ? Proctocolitis   ? On Lialda  ? Ulcerative colitis ([place])   ? ? ?Past Surgical History:  ?Procedure Laterality Date  ? BACK SURGERY    ? lower back / lumbar   ? COLONOSCOPY  2003  ? 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 COLONOSCOPY  March 2015  ? Dr. Britta Mccreedy: diminutive hyperplastic polyp. scope advanced to the cecum, no intubation of TI, no random biopsies performed  ? COLONOSCOPY N/A 11/29/2014  ? Dr. Gala Romney: left-sided proctocolitis   ? KNEE ARTHROPLASTY Right 01/08/2017  ? Procedure: RIGHT TOTAL KNEE ARTHROPLASTY WITH COMPUTER NAVIGATION;  Surgeon: Rod Can, MD;  Location: [facility];  Service: Orthopedics;  Laterality: Right;  Needs RNFA; Adductor block  ? KNEE ARTHROPLASTY Left 02/26/2017  ? Procedure: LEFT TOTAL KNEE ARTHROPLASTY;  Surgeon: Rod Can, MD;  Location: [facility];  Service: Orthopedics;  Laterality: Left;  Needs RNFA  ? LUNG SURGERY    ? collapsed lung; bilateral per  patient "they called it a bleb that burst"   ? ? ?Current Outpatient Medications  ?Medication Sig Dispense Refill  ? acetaminophen (TYLENOL) 500 MG tablet Take 1,500-2,000 mg by mouth every 6 (six) hours as needed for moderate pain or headache.    ? amLODipine (NORVASC) 10 MG tablet Take 10 mg by mouth every morning.  3  ? Ascorbic Acid (VITAMIN C PO) Take 500 mg by mouth daily.    ? hydrochlorothiazide (HYDRODIURIL) 25 MG tablet Take 25 mg by mouth daily.  3  ? levothyroxine (SYNTHROID, LEVOTHROID) 75 MCG tablet Take 75 mcg by mouth daily before breakfast.    ? lisinopril (ZESTRIL) 20 MG tablet Take 20 mg by mouth daily.    ? mesalamine (LIALDA) 1.2 G EC tablet Take 4 tablets (4.8 g total) by mouth daily with breakfast. 360 tablet 1  ? metoprolol succinate (TOPROL XL) 25 MG 24 hr tablet Take 1 tablet (25 mg total) by mouth daily. 90 tablet 3  ? nitroGLYCERIN (NITROSTAT) 0.4 MG SL tablet Place 1 tablet (0.4 mg total) under the tongue every 5 (five) minutes as needed for chest pain. 90 tablet 3  ? rosuvastatin (CRESTOR) 20 MG tablet Take 1 tablet (20 mg total) by mouth daily. 90 tablet 3  ? terbinafine (LAMISIL) 250 MG tablet Take 250 mg by mouth daily.    ? VITAMIN D, CHOLECALCIFEROL, PO Take 1,000 Units by mouth daily.    ? zinc gluconate 50  MG tablet Take 50 mg by mouth daily.    ? ?No current facility-administered medications for this visit.  ? ? ?Allergies as of 10/23/2021  ? (No Known Allergies)  ? ? ?Family History  ?Problem Relation Age of Onset  ? Hypertension Mother   ? Heart disease Mother   ? Heart attack Brother   ?     coronary artery stent at age 25  ? Heart attack Maternal Grandfather   ?     died of MI in late 43s  ? Hypertension Other   ? Colon cancer Neg Hx   ? Inflammatory bowel disease Neg Hx   ? ? ?Social History  ? ?Socioeconomic History  ? Marital status: Married  ?  Spouse name: Not on file  ? Number of children: Not on file  ? Years of education: Not on file  ? Highest education level: Not  on file  ?Occupational History  ? Not on file  ?Tobacco Use  ? Smoking status: Former  ?  Types: Cigarettes  ?  Quit date: 02/14/1985  ?  Years since quitting: 36.7  ? Smokeless tobacco: Never  ? Tobacco comments:  ?  smoked 1 ppd for 25 years, quit at age 43  ?Vaping Use  ? Vaping Use: Never used  ?Substance and Sexual Activity  ? Alcohol use: Yes  ?  Alcohol/week: 0.0 standard drinks  ?  Comment: occasional wine  ? Drug use: No  ? Sexual activity: Not on file  ?Other Topics Concern  ? Not on file  ?Social History Narrative  ? Not on file  ? ?Social Determinants of Health  ? ?Financial Resource Strain: Not on file  ?Food Insecurity: Not on file  ?Transportation Needs: Not on file  ?Physical Activity: Not on file  ?Stress: Not on file  ?Social Connections: Not on file  ? ? ?Review of Systems: ?Gen: Denies fever, chills, anorexia. Denies fatigue, weakness, weight loss.  ?CV: Denies chest pain, palpitations, syncope, peripheral edema, and claudication. ?Resp: Denies dyspnea at rest, cough, wheezing, coughing up blood, and pleurisy. ?GI: Denies vomiting blood, jaundice, and fecal incontinence.   Denies dysphagia or odynophagia. ?Derm: Denies rash, itching, dry skin ?Psych: Denies depression, anxiety, memory loss, confusion. No homicidal or suicidal ideation.  ?Heme: Denies bruising, bleeding, and enlarged lymph nodes. ? ?Physical Exam: ?There were no vitals taken for this visit. ?General:   Alert and oriented. No distress noted. Pleasant and cooperative.  ?Head:  Normocephalic and atraumatic. ?Eyes:  Conjuctiva clear without scleral icterus. ?Mouth:  Oral mucosa pink and moist. Good dentition. No lesions. ?Heart:  S1, S2 present without murmurs appreciated. ?Lungs:  Clear to auscultation bilaterally. No wheezes, rales, or rhonchi. No distress.  ?Abdomen:  +BS, soft, non-tender and non-distended. No rebound or guarding. No HSM or masses noted. ?Msk:  Symmetrical without gross deformities. Normal posture. ?Extremities:   Without edema. ?Neurologic:  Alert and  oriented x4 ?Psych:  Alert and cooperative. Normal mood and affect.  ?

## 2021-10-23 ENCOUNTER — Encounter: Payer: Self-pay | Admitting: Internal Medicine

## 2021-10-23 ENCOUNTER — Ambulatory Visit: Payer: Non-veteran care | Admitting: Gastroenterology

## 2021-11-03 NOTE — Progress Notes (Addendum)
? ? ?Referring Provider: No ref. provider found ?Primary Care Physician:  No primary care provider on file. ?Primary GI Physician: Dr. Gala Romney ? ?Chief Complaint  ?Patient presents with  ? Follow-up  ? ? ?HPI:   ?Jerome Stark is a 76 y.o. 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  ?  ?Last seen in our office 04/25/2021.  Clinically, he was doing very well . No alarm symptoms.  Reported vitamin D deficiency and just recently resumed taking vitamin D.  Recommended continuing Lialda, vitamin D, and would request recent blood work from [place].  ? ?Received labs from the [place] dated 12/21/2020.  Creatinine, electrolytes, LFTs, hemoglobin within normal limits.  Vitamin D, 25 hydroxy 92.3. ? ?Today: ?Doing well overall.  Continues to take Lialda 4.8 g daily.  Denies abdominal pain, constipation, diarrhea, BRBPR, melena, unintentional weight loss.  Has a good appetite.  Has good energy.  No upper GI symptoms such as nausea, vomiting, reflux, or dysphagia. ? ?Labs: Due for labs in July at the [place]. Prefers to have them completed there.  ?  ?Denies NSAIDs.  ?Denies tobacco use. ?  ?Bone density scan (over 60 years): Never had one.  ?  ?Vaccines:  ?Influenza: States he doesn't receive the flu vaccine.   ?Pneumococcal: Has received 2 doses including the newest vaccine.  ?Shingles: Yes.  ?  ?Due for surveillance colonoscopy in 2024 (8 years after diagnosis).  ? ? ?Past Medical History:  ?Diagnosis Date  ? Hyperlipidemia   ? Hypertension   ? Hypothyroidism   ? Proctocolitis   ? On Lialda  ? Ulcerative colitis ([place])   ? On Lialda  ? ? ?Past Surgical History:  ?Procedure Laterality Date  ? BACK SURGERY    ? lower back / lumbar   ? COLONOSCOPY  2003  ? 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 COLONOSCOPY  10/2013  ? Dr. Britta Mccreedy: diminutive hyperplastic polyp. scope advanced to the cecum, no intubation of TI, no random biopsies performed  ? COLONOSCOPY N/A 11/29/2014  ?  Dr. Gala Romney: left-sided proctocolitis; surveillance due in 2024  ? KNEE ARTHROPLASTY Right 01/08/2017  ? Procedure: RIGHT TOTAL KNEE ARTHROPLASTY WITH COMPUTER NAVIGATION;  Surgeon: Rod Can, MD;  Location: [facility];  Service: Orthopedics;  Laterality: Right;  Needs RNFA; Adductor block  ? KNEE ARTHROPLASTY Left 02/26/2017  ? Procedure: LEFT TOTAL KNEE ARTHROPLASTY;  Surgeon: Rod Can, MD;  Location: [facility];  Service: Orthopedics;  Laterality: Left;  Needs RNFA  ? LUNG SURGERY    ? collapsed lung; bilateral per patient "they called it a bleb that burst"   ? ? ?Current Outpatient Medications  ?Medication Sig Dispense Refill  ? acetaminophen (TYLENOL) 500 MG tablet Take 1,500-2,000 mg by mouth every 6 (six) hours as needed for moderate pain or headache.    ? amLODipine (NORVASC) 10 MG tablet Take 10 mg by mouth every morning.  3  ? Ascorbic Acid (VITAMIN C PO) Take 500 mg by mouth daily.    ? hydrochlorothiazide (HYDRODIURIL) 25 MG tablet Take 25 mg by mouth daily.  3  ? levothyroxine (SYNTHROID, LEVOTHROID) 75 MCG tablet Take 75 mcg by mouth daily before breakfast.    ? lisinopril (ZESTRIL) 20 MG tablet Take 20 mg by mouth daily.    ? metoprolol succinate (TOPROL XL) 25 MG 24 hr tablet Take 1 tablet (25 mg total) by mouth daily. 90 tablet 3  ? nitroGLYCERIN (NITROSTAT) 0.4 MG SL  tablet Place 1 tablet (0.4 mg total) under the tongue every 5 (five) minutes as needed for chest pain. 90 tablet 3  ? rosuvastatin (CRESTOR) 20 MG tablet Take 1 tablet (20 mg total) by mouth daily. 90 tablet 3  ? terbinafine (LAMISIL) 250 MG tablet Take 250 mg by mouth daily.    ? VITAMIN D, CHOLECALCIFEROL, PO Take 1,000 Units by mouth daily.    ? zinc gluconate 50 MG tablet Take 50 mg by mouth daily.    ? mesalamine (LIALDA) 1.2 g EC tablet Take 4 tablets (4.8 g total) by mouth daily with breakfast. 360 tablet 1  ? ?No current facility-administered medications for this visit.  ? ? ?Allergies as of 11/04/2021  ? (No Known  Allergies)  ? ? ?Family History  ?Problem Relation Age of Onset  ? Hypertension Mother   ? Heart disease Mother   ? Heart attack Brother   ?     coronary artery stent at age 31  ? Heart attack Maternal Grandfather   ?     died of MI in late 29s  ? Hypertension Other   ? Colon cancer Neg Hx   ? Inflammatory bowel disease Neg Hx   ? ? ?Social History  ? ?Socioeconomic History  ? Marital status: Married  ?  Spouse name: Not on file  ? Number of children: Not on file  ? Years of education: Not on file  ? Highest education level: Not on file  ?Occupational History  ? Not on file  ?Tobacco Use  ? Smoking status: Former  ?  Types: Cigarettes  ?  Quit date: 02/14/1985  ?  Years since quitting: 36.7  ? Smokeless tobacco: Never  ? Tobacco comments:  ?  smoked 1 ppd for 25 years, quit at age 76  ?Vaping Use  ? Vaping Use: Never used  ?Substance and Sexual Activity  ? Alcohol use: Yes  ?  Alcohol/week: 0.0 standard drinks  ?  Comment: occasional wine  ? Drug use: No  ? Sexual activity: Not on file  ?Other Topics Concern  ? Not on file  ?Social History Narrative  ? Not on file  ? ?Social Determinants of Health  ? ?Financial Resource Strain: Not on file  ?Food Insecurity: Not on file  ?Transportation Needs: Not on file  ?Physical Activity: Not on file  ?Stress: Not on file  ?Social Connections: Not on file  ? ? ?Review of Systems: ?Gen: Denies fever, chills, cold or flu like symptoms, fatigue, pre-syncope, or syncope,  ?CV: Denies chest pain, palpitations. ?Resp: Denies dyspnea or cough.  ?GI: See HPI ?Derm: Denies rash ?Psych: Denies depression, anxiety ?Heme: See HPI ? ?Physical Exam: ?BP 124/71   Pulse (!) 58   Temp (!) 97.5 ?F (36.4 ?C) (Temporal)   Ht 5' 11"  (1.803 m)   Wt 199 lb 12.8 oz (90.6 kg)   BMI 27.87 kg/m?  ?General:   Alert and oriented. No distress noted. Pleasant and cooperative.  ?Head:  Normocephalic and atraumatic. ?Eyes:  Conjuctiva clear without scleral icterus. ?Heart:  S1, S2 present without murmurs  appreciated. ?Lungs:  Clear to auscultation bilaterally. No wheezes, rales, or rhonchi. No distress.  ?Abdomen:  +BS, soft, non-tender and non-distended. No rebound or guarding. No HSM or masses noted. ?Msk:  Symmetrical without gross deformities. Normal posture. ?Extremities:  Without edema. ?Neurologic:  Alert and  oriented x4 ?Psych:  Normal mood and affect. ? ? ? ?Assessment:  ?76 year old male with history of left-sided  proctocolitis diagnosed in 2016 and maintained on Lialda 4.8 g daily, presenting today for routine follow-up.  Clinically, he continues to do very well.  No alarm symptoms.  He is overdue for routine labs, but states he will have these completed in July at the [place].  He has never had a bone density scan we will arrange this for him.  He will be due for surveillance colonoscopy in 2024 (8 years after diagnosis).  Regarding vaccine status, he is up-to-date on pneumococcal and shingles vaccine.  States he does not receive influenza vaccines. ? ? ? ?Plan:  ?Continue Lialda 4.8 g daily. Refill sent to pharmacy.  ?Bone density scan. ?[facility]  ?Surveillance colonoscopy April 2024. ?Follow-up in 6 months or sooner if needed. ? ? ?Aliene Altes, PA-C ?[facility] ?11/04/2021 ? ? ?Addendum: Received code inquiry regarding necessity for bone density scan. Indication for bone density scan includes: Patient with ulcerative colitis, greater than age 75, and no prior bone density scan. Ulcerative colitis places patients at higher risk for bone loss.  ? ?

## 2021-11-04 ENCOUNTER — Ambulatory Visit: Payer: Medicare Other | Admitting: Gastroenterology

## 2021-11-04 ENCOUNTER — Encounter: Payer: Self-pay | Admitting: Gastroenterology

## 2021-11-04 VITALS — BP 124/71 | HR 58 | Temp 97.5°F | Ht 71.0 in | Wt 199.8 lb

## 2021-11-04 DIAGNOSIS — K5180 Other ulcerative colitis without complications: Secondary | ICD-10-CM | POA: Diagnosis not present

## 2021-11-04 MED ORDER — MESALAMINE 1.2 G PO TBEC: 4.8000 g | DELAYED_RELEASE_TABLET | Freq: Every day | ORAL | 1 refills | Status: DC

## 2021-11-04 NOTE — Patient Instructions (Signed)
Continue taking Lialda 4.8 g total daily. ? ?We will arrange you to have a bone density scan in the near future. ? ?We will follow-up with you in 6 months.  Do not hesitate to call if you have any questions or concerns prior to your next visit. ? ?It was good seeing you again today. Glad you are doing well! ? ?Aliene Altes, PA-C ?[facility] ? ?

## 2021-11-12 ENCOUNTER — Ambulatory Visit (HOSPITAL_COMMUNITY)
Admission: RE | Admit: 2021-11-12 | Discharge: 2021-11-12 | Disposition: A | Discharge status: Home / Self Care | Payer: Medicare Other | Source: Ambulatory Visit | Attending: Gastroenterology | Admitting: Gastroenterology

## 2021-11-12 DIAGNOSIS — Z13820 Encounter for screening for osteoporosis: Secondary | ICD-10-CM | POA: Diagnosis not present

## 2021-11-12 DIAGNOSIS — E58 Dietary calcium deficiency: Secondary | ICD-10-CM | POA: Diagnosis not present

## 2021-11-12 DIAGNOSIS — Z8781 Personal history of (healed) traumatic fracture: Secondary | ICD-10-CM | POA: Insufficient documentation

## 2021-11-12 DIAGNOSIS — M47816 Spondylosis without myelopathy or radiculopathy, lumbar region: Secondary | ICD-10-CM | POA: Diagnosis not present

## 2021-11-12 DIAGNOSIS — K5180 Other ulcerative colitis without complications: Secondary | ICD-10-CM | POA: Diagnosis present

## 2021-11-12 DIAGNOSIS — Z79899 Other long term (current) drug therapy: Secondary | ICD-10-CM | POA: Diagnosis not present

## 2021-11-12 DIAGNOSIS — Z9889 Other specified postprocedural states: Secondary | ICD-10-CM | POA: Insufficient documentation

## 2021-11-12 DIAGNOSIS — Z79890 Hormone replacement therapy (postmenopausal): Secondary | ICD-10-CM | POA: Insufficient documentation

## 2021-12-16 ENCOUNTER — Ambulatory Visit (HOSPITAL_BASED_OUTPATIENT_CLINIC_OR_DEPARTMENT_OTHER): Payer: Medicare Other | Admitting: Cardiology

## 2021-12-16 VITALS — BP 118/60 | HR 61 | Ht 71.0 in | Wt 199.2 lb

## 2021-12-16 DIAGNOSIS — I700 Atherosclerosis of aorta: Secondary | ICD-10-CM

## 2021-12-16 DIAGNOSIS — Z8249 Family history of ischemic heart disease and other diseases of the circulatory system: Secondary | ICD-10-CM

## 2021-12-16 DIAGNOSIS — I25118 Atherosclerotic heart disease of native coronary artery with other forms of angina pectoris: Secondary | ICD-10-CM

## 2021-12-16 DIAGNOSIS — I10 Essential (primary) hypertension: Secondary | ICD-10-CM | POA: Diagnosis not present

## 2021-12-16 DIAGNOSIS — E7800 Pure hypercholesterolemia, unspecified: Secondary | ICD-10-CM

## 2021-12-16 DIAGNOSIS — Z7189 Other specified counseling: Secondary | ICD-10-CM

## 2021-12-16 NOTE — Patient Instructions (Signed)
Medication Instructions:  ?Your Physician recommend you continue on your current medication as directed.   ? ?*If you need a refill on your cardiac medications before your next appointment, please call your pharmacy* ? ? ?Lab Work: ?Please have [facility] fax lab work to [phone] ? ? ?Testing/Procedures: ?None ordered today ? ? ?Follow-Up: ?At [facility], you and your health needs are our priority.  As part of our continuing mission to provide you with exceptional heart care, we have created designated Provider Care Teams.  These Care Teams include your primary Cardiologist (physician) and Advanced Practice Providers (APPs -  Physician Assistants and Nurse Practitioners) who all work together to provide you with the care you need, when you need it. ? ?We recommend signing up for the patient portal called "MyChart".  Sign up information is provided on this After Visit Summary.  MyChart is used to connect with patients for Virtual Visits (Telemedicine).  Patients are able to view lab/test results, encounter notes, upcoming appointments, etc.  Non-urgent messages can be sent to your provider as well.   ?To learn more about what you can do with MyChart, go to [email or web address].   ? ?Your next appointment:   ?1 year(s) ? ?The format for your next appointment:   ?In Person ? ?Provider:   ?Buford Dresser, MD{ ? ? ?Important Information About Sugar ? ? ? ? ? ? ?

## 2021-12-16 NOTE — Progress Notes (Signed)
Cardiology Office Note:    Date:  12/16/2021   ID:  Jerome Stark, DOB 1946-08-01, MRN [id number]  PCP:  [facility] [place]  Cardiologist:  Buford Dresser, MD  CC: follow up  History of Present Illness:    Jerome Stark is a 76 y.o. male with a hx of hypertension, hyperlipidemia, hypothyroidism, and ulcerative colitis, who is seen for follow-up. He was initially seen 08/28/21 for post hospital follow-up for chest pain.  Cardiac history: He presented to the ED 08/06/21 following 2 episodes of substernal chest pain described as a burning sensation radiating down to the right nipple. He had 2 more episodes while waiting in the lobby, but had no active chest pain during his evaluation. Troponin and delta troponin were negative, and his heart score was 3. CT PE was negative. He was discharged with outpatient cardiology follow-up.  Coronary calcium on CT scan. He is open to increasing his rosuvastatin. He had taken aspirin in the past but it was discontinued due to burst blood vessels. He prefers trying medication first rather than undergoing a catheterization.   His younger brother had a stent placed and was on a statin several years ago. His mother had open heart surgery when she was 75 years old.   Today: Brings blood pressure log with him today. Well controlled, at home 105/67-151/81. Most 110s/70s. Worked on improving his diet, lots of chicken, avoids fried food.   Still working, walks a lot during his job. Feels worn out at the end of the day. Mild LE edema at the end of day, better in the AM.   Rare chest pressure, like it's hard to take a deep breath. Happens a few times/week, lasts 2-3 minutes, often related to post-prandial timing. Not exertional.   Transferring from [place] to [place] in about two weeks. Will have full wokrup at that time, including labs.  Denies chest pain other than above, shortness of breath at rest or with normal exertion. No PND,  orthopnea, change in LE edema or unexpected weight gain. No syncope or palpitations.   Past Medical History:  Diagnosis Date   Hyperlipidemia    Hypertension    Hypothyroidism    Proctocolitis    On Lialda   Ulcerative colitis ([place])    On Lialda    Past Surgical History:  Procedure Laterality Date   BACK SURGERY     lower back / lumbar    COLONOSCOPY  2003   Dr. Gala Romney: idiopathic proctitis   COLONOSCOPY  10/2013   Dr. Britta Mccreedy: diminutive hyperplastic polyp. scope advanced to the cecum, no intubation of TI, no random biopsies performed   COLONOSCOPY N/A 11/29/2014   Dr. Gala Romney: left-sided proctocolitis; surveillance due in 2024   KNEE ARTHROPLASTY Right 01/08/2017   Procedure: RIGHT TOTAL KNEE ARTHROPLASTY WITH COMPUTER NAVIGATION;  Surgeon: Rod Can, MD;  Location: [facility];  Service: Orthopedics;  Laterality: Right;  Needs RNFA; Adductor block   KNEE ARTHROPLASTY Left 02/26/2017   Procedure: LEFT TOTAL KNEE ARTHROPLASTY;  Surgeon: Rod Can, MD;  Location: [facility];  Service: Orthopedics;  Laterality: Left;  Needs RNFA   LUNG SURGERY     collapsed lung; bilateral per patient "they called it a bleb that burst"     Current Medications: Current Outpatient Medications on File Prior to Visit  Medication Sig   acetaminophen (TYLENOL) 500 MG tablet Take 1,500-2,000 mg by mouth every 6 (six) hours as needed for moderate pain or headache.   amLODipine (NORVASC)  10 MG tablet Take 10 mg by mouth every morning.   Ascorbic Acid (VITAMIN C PO) Take 500 mg by mouth daily.   co-enzyme Q-10 30 MG capsule Take 30 mg by mouth daily.   hydrochlorothiazide (HYDRODIURIL) 25 MG tablet Take 25 mg by mouth daily.   levothyroxine (SYNTHROID, LEVOTHROID) 75 MCG tablet Take 75 mcg by mouth daily before breakfast.   lisinopril (ZESTRIL) 20 MG tablet Take 20 mg by mouth daily.   mesalamine (LIALDA) 1.2 g EC tablet Take 4 tablets (4.8 g total) by mouth daily with breakfast.   metoprolol succinate  (TOPROL XL) 25 MG 24 hr tablet Take 1 tablet (25 mg total) by mouth daily.   nitroGLYCERIN (NITROSTAT) 0.4 MG SL tablet Place 1 tablet (0.4 mg total) under the tongue every 5 (five) minutes as needed for chest pain.   rosuvastatin (CRESTOR) 20 MG tablet Take 1 tablet (20 mg total) by mouth daily.   VITAMIN D, CHOLECALCIFEROL, PO Take 1,000 Units by mouth daily.   zinc gluconate 50 MG tablet Take 50 mg by mouth daily.   No current facility-administered medications on file prior to visit.     Allergies:   Patient has no known allergies.   Social History   Tobacco Use   Smoking status: Former    Types: Cigarettes    Quit date: 02/14/1985    Years since quitting: 36.8   Smokeless tobacco: Never   Tobacco comments:    smoked 1 ppd for 25 years, quit at age 20  Vaping Use   Vaping Use: Never used  Substance Use Topics   Alcohol use: Yes    Alcohol/week: 0.0 standard drinks    Comment: occasional wine   Drug use: No    Family History: family history includes Heart attack in his brother and maternal grandfather; Heart disease in his mother; Hypertension in his mother and another family member. There is no history of Colon cancer or Inflammatory bowel disease.  ROS:   Please see the history of present illness.  Additional pertinent ROS otherwise unremarkable.  EKGs/Labs/Other Studies Reviewed:    The following studies were reviewed today: CT Coronary FFR 09/05/21 FINDINGS: FFRct analysis was performed on the original cardiac CT angiogram dataset. Diagrammatic representation of the FFRct analysis is provided in a separate PDF document in PACS. This dictation was created using the PDF document and an interactive 3D model of the results. 3D model is not available in the EMR/PACS. Normal FFR range is >0.80.   1. Left Main: findings 1.00, 1.00 1.00   2. LAD: findings 0.93, 0.74 0.67   3. LCX: findings 0.97, 0.97   4. OM: findings 0.95, 0.91   5. RCA: findings 0.98, 0.94 0.89    IMPRESSION: FFR suggests mid LAD is significant/flow limiting  CT Coronary with Calcium Score 2/2/233: 1. Coronary calcium score of 875. This was 72 percentile for age-, sex, and race-matched controls.   2. Normal coronary origin with right dominance.   3. Moderate (50-69) mixed plaque stenosis in the mid LAD; remainder of CAD is mild.   4. Aortic atherosclerosis.   5. Study will be sent for FFR.  CTA Chest 08/06/2021: COMPARISON:  08/06/2021   FINDINGS: Cardiovascular: This is a technically adequate evaluation of the pulmonary vasculature. No filling defects or pulmonary emboli.   The heart is unremarkable without pericardial effusion. Normal caliber of the thoracic aorta. Evaluation of the aortic lumen is limited due to timing of contrast bolus. Moderate atherosclerosis of the aorta  and coronary vasculature.   Mediastinum/Nodes: No enlarged mediastinal, hilar, or axillary lymph nodes. Thyroid gland, trachea, and esophagus demonstrate no significant findings.   Lungs/Pleura: Mild upper lobe predominant emphysema. No acute airspace disease, effusion, or pneumothorax. Central airways are widely patent.   Upper Abdomen: No acute abnormality.   Musculoskeletal: No acute or destructive bony lesions. Reconstructed images demonstrate no additional findings.   Review of the MIP images confirms the above findings.   IMPRESSION: 1. No evidence of pulmonary embolus. 2. No acute intrathoracic process. 3. Aortic Atherosclerosis (ICD10-I70.0) and Emphysema (ICD10-J43.9).  EKG:  EKG is personally reviewed.   09/17/21: SR at 66 bpm, PVC 08/06/2021: SR, PACs, borderline R wave progression  Recent Labs: 05/21/2021: TSH 3.290 08/06/2021: BUN 9; Creatinine, Ser 1.16; Hemoglobin 16.0; Platelets 171; Potassium 3.2; Sodium 134   Recent Lipid Panel    Component Value Date/Time   CHOL 161 08/29/2021 0859   TRIG 97 08/29/2021 0859   HDL 47 08/29/2021 0859   CHOLHDL 3.4 08/29/2021 0859    LDLCALC 96 08/29/2021 0859    Physical Exam:    VS:  BP 118/60 (BP Location: Left Arm, Patient Position: Sitting)   Pulse 61   Ht 5' 11"  (1.803 m)   Wt 199 lb 3.2 oz (90.4 kg)   SpO2 97%   BMI 27.78 kg/m     Wt Readings from Last 3 Encounters:  12/16/21 199 lb 3.2 oz (90.4 kg)  11/04/21 199 lb 12.8 oz (90.6 kg)  09/17/21 197 lb 14.4 oz (89.8 kg)    GEN: Well nourished, well developed in no acute distress HEENT: Normal, moist mucous membranes NECK: No JVD CARDIAC: regular rhythm, normal S1 and S2, no rubs or gallops. No murmur. VASCULAR: Radial and DP pulses 2+ bilaterally. No carotid bruits RESPIRATORY:  Clear to auscultation without rales, wheezing or rhonchi  ABDOMEN: Soft, non-tender, non-distended MUSCULOSKELETAL:  Ambulates independently SKIN: Warm and dry, no edema NEUROLOGIC:  Alert and oriented x 3. No focal neuro deficits noted. PSYCHIATRIC:  Normal affect   ASSESSMENT:    1. Coronary artery disease involving native coronary artery of native heart with other form of angina pectoris ([place])   2. Essential hypertension   3. Aortic atherosclerosis ([place])   4. Family history of heart disease   5. Pure hypercholesterolemia   6. Cardiac risk counseling   7. Counseling on health promotion and disease prevention    PLAN:    Coronary artery disease, with atypical angina Aortic atherosclerosis Family history of heart disease -we reviewed his prior testing, recommendations for management -increased rosuvastatin at last visit. Has recheck labs coming up in 2 weeks with the [place]. Gave fax number. -we discussed aspirin. He has history of bleeding. Understands that if he gets a stent in the future, he would need DAPT, but he would like to hold on aspirin for now -we discussed red flag warning signs that need immediate medical attention -we discussed antianginals. He is on amlodipine and has done well with this. We discussed nitrates, but we reviewed interactions with PDE5  inhibitors, he would like to avoid. Tolerating low dose beta blocker -discussed how to use SL NG, discussed risk with PDE5 inhibitors  Hypertension: at goal today -continue amlodipine, HCTZ, lisinopril, metoprolol  Cardiac risk counseling and prevention recommendations: -recommend heart healthy/Mediterranean diet, with whole grains, fruits, vegetable, fish, lean meats, nuts, and olive oil. Limit salt. -recommend moderate walking, 3-5 times/week for 30-50 minutes each session. Aim for at least 150 minutes.week. Goal should be  pace of 3 miles/hours, or walking 1.5 miles in 30 minutes -recommend avoidance of tobacco products. Avoid excess alcohol.  Plan for follow up: 1 year or sooner PRN  Buford Dresser, MD, PhD, [facility]    Medication Adjustments/Labs and Tests Ordered: Current medicines are reviewed at length with the patient today.  Concerns regarding medicines are outlined above.   No orders of the defined types were placed in this encounter.  No orders of the defined types were placed in this encounter.  Patient Instructions  Medication Instructions:  Your Physician recommend you continue on your current medication as directed.    *If you need a refill on your cardiac medications before your next appointment, please call your pharmacy*   Lab Work: Please have [facility] fax lab work to [phone]   Testing/Procedures: None ordered today   Follow-Up: At [facility], you and your health needs are our priority.  As part of our continuing mission to provide you with exceptional heart care, we have created designated Provider Care Teams.  These Care Teams include your primary Cardiologist (physician) and Advanced Practice Providers (APPs -  Physician Assistants and Nurse Practitioners) who all work together to provide you with the care you need, when you need it.  We recommend signing up for the patient portal called "MyChart".  Sign up  information is provided on this After Visit Summary.  MyChart is used to connect with patients for Virtual Visits (Telemedicine).  Patients are able to view lab/test results, encounter notes, upcoming appointments, etc.  Non-urgent messages can be sent to your provider as well.   To learn more about what you can do with MyChart, go to [email or web address].    Your next appointment:   1 year(s)  The format for your next appointment:   In Person  Provider:   Buford Dresser, MD{   Important Information About Sugar        Signed, Buford Dresser, MD PhD 12/16/2021     [place]

## 2022-01-17 ENCOUNTER — Encounter (HOSPITAL_BASED_OUTPATIENT_CLINIC_OR_DEPARTMENT_OTHER): Payer: Self-pay | Admitting: Cardiology

## 2022-03-05 ENCOUNTER — Telehealth: Payer: Self-pay | Admitting: Gastroenterology

## 2022-03-05 NOTE — Telephone Encounter (Signed)
Jerome Stark, can you request recent labs from the [place]?

## 2022-03-14 NOTE — Telephone Encounter (Signed)
Noted  

## 2022-04-04 NOTE — Telephone Encounter (Signed)
Spoke to pt, he informed me that he is taking iron pills, he started them last Monday. States no he has not noticed any bloody or black stool. No change in bowel habits.

## 2022-04-04 NOTE — Telephone Encounter (Signed)
Received and reviewed labs from the [place] dated 01/02/2022.  He was found to have low iron saturation of 17.7%, ferritin normal at 200.2, iron normal at 58.  B12 320, folate greater than 20.  No CBC included.  LFTs within normal limits.  Kidney function within normal limits.  Vitamin D and TSH within normal limits.  [facility] provider recommended patient start ferrous sulfate 325 mg 3 days a week.   [place]: Please let patient know that I have received and reviewed labs completed with the [facility] 01/02/2022.  I stated he was found to have low iron saturation.  This is overall nonspecific as his ferritin and iron plasma were within normal limits.    Did he start oral iron?  Has not noticed any bright red blood per rectum, black stools, change in bowel habits?

## 2022-05-04 NOTE — Progress Notes (Deleted)
Referring Provider: No ref. provider found Primary Care Physician:  [facility] Primary GI Physician: Dr. Marland Kitchen  No chief complaint on file.   HPI:   Jerome Stark is a 76 y.o. 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   Last seen in our office 11/04/2021.  He was doing well on Lialda 4.8 g daily with no significant GI symptoms whatsoever.  He was due for routine labs and planned to have these completed at the [place].  He was up-to-date on his vaccines aside from influenza vaccine which he has declined.  He was also in need of a bone density scan as he has never had 1.  Received lab results from the [place] dated 01/02/22:  He was found to have low iron saturation of 17.7%, ferritin normal at 200.2, iron normal at 58.  B12 320, folate greater than 20.  No CBC included. (Prior Hgb 16.0 January 2023) LFTs within normal limits.  Kidney function within normal limits.  Vitamin D and TSH within normal limits. [facility] provider recommended patient start ferrous sulfate 325 mg 3 days a week. Patient reported no overt GI bleeding or change in bowel habits.    DEXA 11/12/2021: Normal.   Today:       Due for surveillance colonoscopy in April 2024 (8 years after diagnosis).  Past Medical History:  Diagnosis Date   Hyperlipidemia    Hypertension    Hypothyroidism    Proctocolitis    On Lialda   Ulcerative colitis ([place])    On Lialda    Past Surgical History:  Procedure Laterality Date   BACK SURGERY     lower back / lumbar    COLONOSCOPY  2003   Dr. Gala Romney: idiopathic proctitis   COLONOSCOPY  10/2013   Dr. Britta Mccreedy: diminutive hyperplastic polyp. scope advanced to the cecum, no intubation of TI, no random biopsies performed   COLONOSCOPY N/A 11/29/2014   Dr. Gala Romney: left-sided proctocolitis; surveillance due in 2024   KNEE ARTHROPLASTY Right 01/08/2017   Procedure: RIGHT TOTAL KNEE ARTHROPLASTY WITH  COMPUTER NAVIGATION;  Surgeon: Rod Can, MD;  Location: [facility];  Service: Orthopedics;  Laterality: Right;  Needs RNFA; Adductor block   KNEE ARTHROPLASTY Left 02/26/2017   Procedure: LEFT TOTAL KNEE ARTHROPLASTY;  Surgeon: Rod Can, MD;  Location: [facility];  Service: Orthopedics;  Laterality: Left;  Needs RNFA   LUNG SURGERY     collapsed lung; bilateral per patient "they called it a bleb that burst"     Current Outpatient Medications  Medication Sig Dispense Refill   acetaminophen (TYLENOL) 500 MG tablet Take 1,500-2,000 mg by mouth every 6 (six) hours as needed for moderate pain or headache.     amLODipine (NORVASC) 10 MG tablet Take 10 mg by mouth every morning.  3   Ascorbic Acid (VITAMIN C PO) Take 500 mg by mouth daily.     co-enzyme Q-10 30 MG capsule Take 30 mg by mouth daily.     hydrochlorothiazide (HYDRODIURIL) 25 MG tablet Take 25 mg by mouth daily.  3   levothyroxine (SYNTHROID, LEVOTHROID) 75 MCG tablet Take 75 mcg by mouth daily before breakfast.     lisinopril (ZESTRIL) 20 MG tablet Take 20 mg by mouth daily.     mesalamine (LIALDA) 1.2 g EC tablet Take 4 tablets (4.8 g total) by mouth daily with breakfast. 360 tablet 1   metoprolol succinate (TOPROL XL) 25  MG 24 hr tablet Take 1 tablet (25 mg total) by mouth daily. 90 tablet 3   nitroGLYCERIN (NITROSTAT) 0.4 MG SL tablet Place 1 tablet (0.4 mg total) under the tongue every 5 (five) minutes as needed for chest pain. 90 tablet 3   rosuvastatin (CRESTOR) 20 MG tablet Take 1 tablet (20 mg total) by mouth daily. 90 tablet 3   VITAMIN D, CHOLECALCIFEROL, PO Take 1,000 Units by mouth daily.     zinc gluconate 50 MG tablet Take 50 mg by mouth daily.     No current facility-administered medications for this visit.    Allergies as of 05/05/2022   (No Known Allergies)    Family History  Problem Relation Age of Onset   Hypertension Mother    Heart disease Mother    Heart attack Brother        coronary artery stent  at age 77   Heart attack Maternal Grandfather        died of MI in late 57s   Hypertension Other    Colon cancer Neg Hx    Inflammatory bowel disease Neg Hx     Social History   Socioeconomic History   Marital status: Married    Spouse name: Not on file   Number of children: Not on file   Years of education: Not on file   Highest education level: Not on file  Occupational History   Not on file  Tobacco Use   Smoking status: Former    Types: Cigarettes    Quit date: 02/14/1985    Years since quitting: 37.2   Smokeless tobacco: Never   Tobacco comments:    smoked 1 ppd for 25 years, quit at age 87  Vaping Use   Vaping Use: Never used  Substance and Sexual Activity   Alcohol use: Yes    Alcohol/week: 0.0 standard drinks of alcohol    Comment: occasional wine   Drug use: No   Sexual activity: Not on file  Other Topics Concern   Not on file  Social History Narrative   Not on file   Social Determinants of Health   Financial Resource Strain: Not on file  Food Insecurity: Not on file  Transportation Needs: Not on file  Physical Activity: Not on file  Stress: Not on file  Social Connections: Not on file    Review of Systems: Gen: Denies fever, chills, cold or flulike symptoms, presyncope, syncope..  CV: Denies chest pain, palpitations. Resp: Denies dyspnea, cough. GI: See HPI Heme: See HPI  Physical Exam: There were no vitals taken for this visit. General:   Alert and oriented. No distress noted. Pleasant and cooperative.  Head:  Normocephalic and atraumatic. Eyes:  Conjuctiva clear without scleral icterus. Heart:  S1, S2 present without murmurs appreciated. Lungs:  Clear to auscultation bilaterally. No wheezes, rales, or rhonchi. No distress.  Abdomen:  +BS, soft, non-tender and non-distended. No rebound or guarding. No HSM or masses noted. Msk:  Symmetrical without gross deformities. Normal posture. Extremities:  Without edema. Neurologic:  Alert and   oriented x4 Psych:  Normal mood and affect.    Assessment:     Plan:  ***   Aliene Altes, PA-C [facility] 05/05/2022

## 2022-05-05 ENCOUNTER — Ambulatory Visit: Payer: Medicare Other | Admitting: Gastroenterology

## 2022-05-31 IMAGING — CT CT HEART MORP W/ CTA COR W/ SCORE W/ CA W/CM &/OR W/O CM
4 of 7 series · 8 of 20 positions shown, 9 images · IV contrast (APPLIED)
Comparison: None.

Addendum:
CLINICAL DATA: 75 yo male with chest pain

EXAM:
Cardiac/Coronary CTA
TECHNIQUE: A non-contrast, gated CT scan was obtained with axial slices of 3 mm
through the heart for calcium scoring. Calcium scoring was performed
using the Agatston method. A 120 kV prospective, gated, contrast
cardiac scan was obtained. Gantry rotation speed was 250 msecs and
collimation was 0.6 mm. Two sublingual nitroglycerin tablets (0.8
mg) were given. The 3D data set was reconstructed in 5% intervals of
the 35-75% of the R-R cycle. Diastolic phases were analyzed on a
dedicated workstation using MPR, MIP, and VRT modes. The patient
received 95 cc of contrast.

[Series 6: best diast · axial · 0.39mm/px · z∈[-155,-116]mm · 2 of 291 slices shown, 3 images]
[im 97/291  vessel]
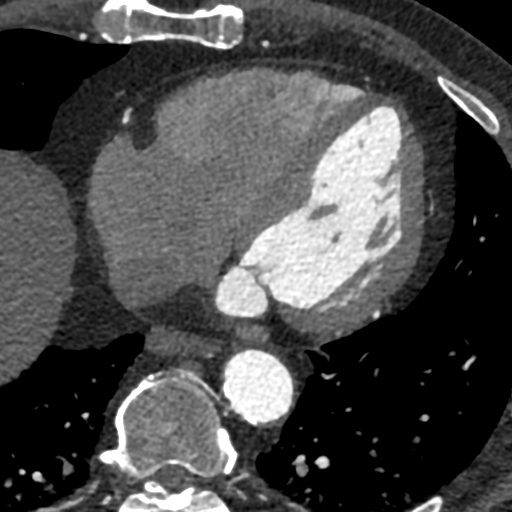
[im 97/291  lung]
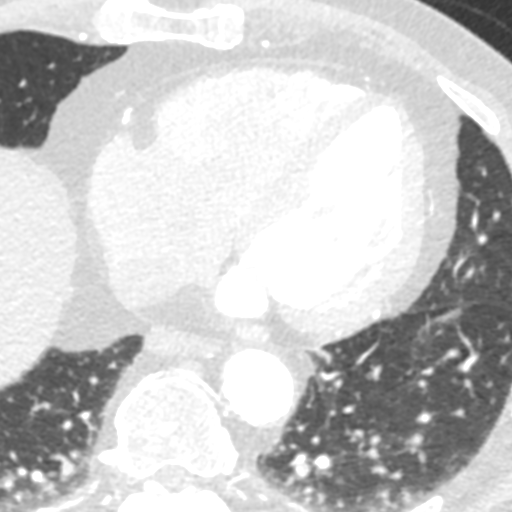
[im 194/291  vessel]
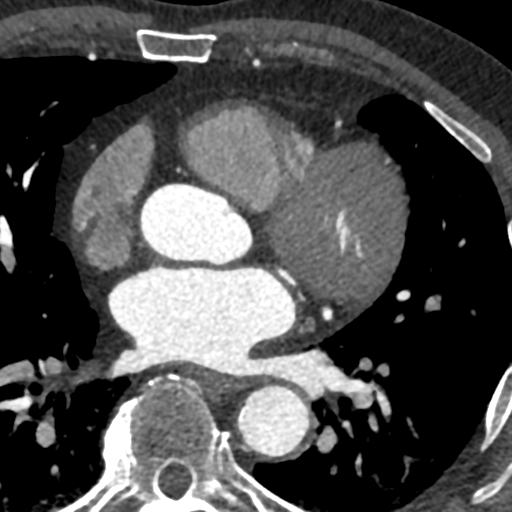

[Series 7: best syst · axial · 0.39mm/px · z∈[-155,-116]mm · 2 of 291 slices shown]
[im 97/291  vessel]
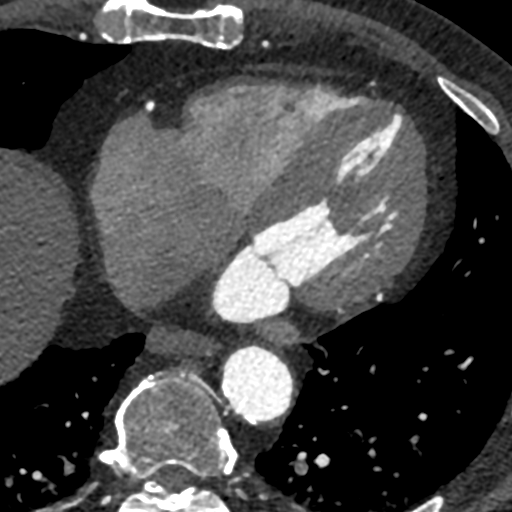
[im 194/291  vessel]
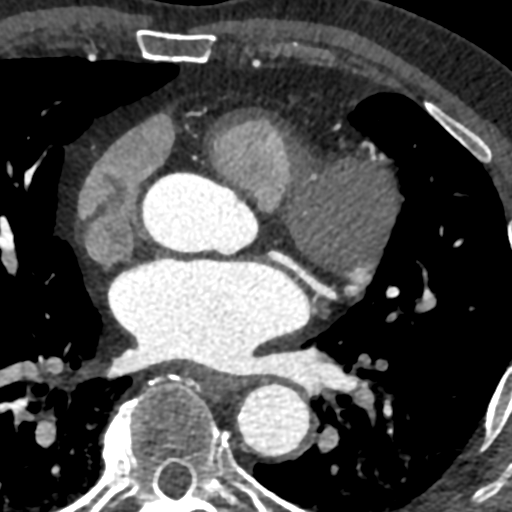

[Series 8: ts diast sharp · axial · 0.39mm/px · z∈[-155,-116]mm · 2 of 291 slices shown]
[im 97/291  lung]
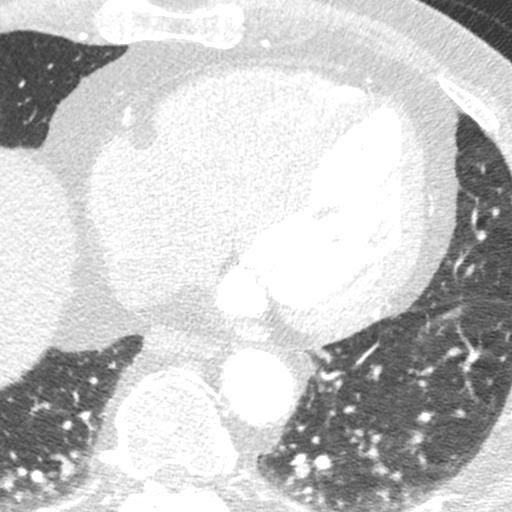
[im 194/291  lung]
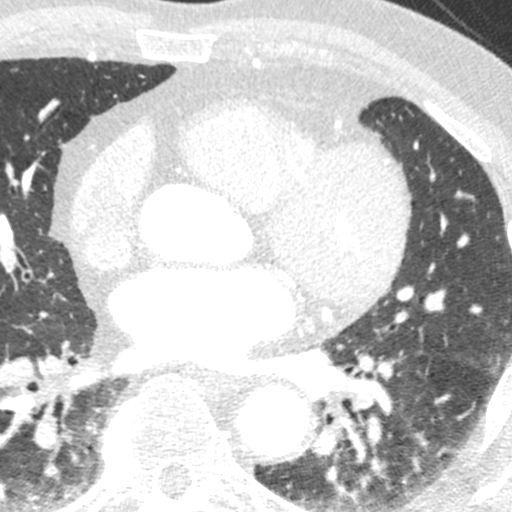

[Series 9: ts syst sharp · axial · 0.39mm/px · z∈[-155,-116]mm · 2 of 291 slices shown]
[im 97/291  lung]
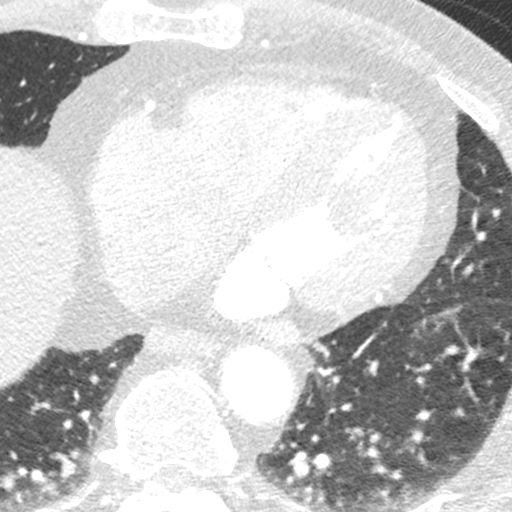
[im 194/291  lung]
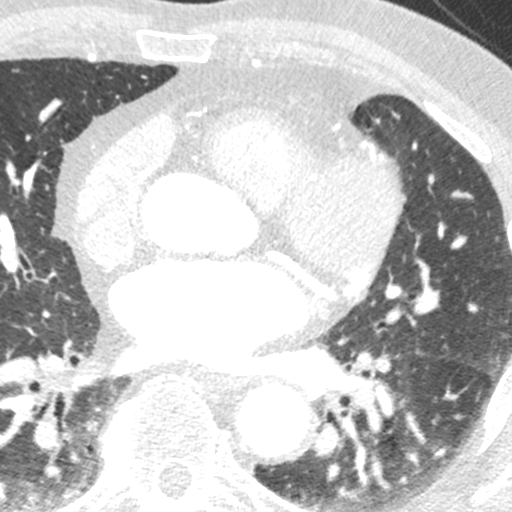

[8 of 20 positions shown; findings below may reference images not displayed]

FINDINGS: Image quality: Average.

Noise artifact is: Limited.

Coronary Arteries:  Normal coronary origin.  Right dominance.

Left main: The left main is a large caliber vessel with a normal
take off from the left coronary cusp that bifurcates to form a left
anterior descending artery and a left circumflex artery. There is
minimal (0-24) calcified plaque.

Left anterior descending artery: The LAD has diffuse, mild (25-49)
calcified plaque in the proximal and mid vessel; there is a moderate
(50-69) mixed plaque stenosis in the mid vessel. The LAD gives off
small D1 and D2 and medium size D3.

Left circumflex artery: The LCX is non-dominant and patent with no
evidence of plaque or stenosis. The LCX gives off 1 large obtuse
marginal branch; there is minimal (0-24) calcified plaque.

Right coronary artery: The RCA is dominant with normal take off from
the right coronary cusp. There is no evidence of plaque or stenosis.
The RCA terminates as a PDA and right posterolateral branch without
evidence of plaque or stenosis.

Right Atrium: Right atrial size is within normal limits.

Right Ventricle: The right ventricular cavity is within normal
limits.

Left Atrium: Left atrial size is normal in size with no left atrial
appendage filling defect.

Left Ventricle: The ventricular cavity size is within normal limits.
There are no stigmata of prior infarction. There is no abnormal
filling defect.

Pulmonary arteries: Normal in size without proximal filling defect.

Pulmonary veins: Normal pulmonary venous drainage.

Pericardium: Normal thickness with no significant effusion or
calcium present.

Cardiac valves: The aortic valve is trileaflet with minimal
calcification. The mitral valve is normal structure without
significant calcification.

Aorta: Normal caliber; aortic atherosclerosis noted.

Extra-cardiac findings: See attached radiology report for
non-cardiac structures.
IMPRESSION: 1. Coronary calcium score of 875. This was 77 percentile for age-,
sex, and race-matched controls.

2. Normal coronary origin with right dominance.

3. Moderate (50-69) mixed plaque stenosis in the mid LAD; remainder
of CAD is mild.

4. Aortic atherosclerosis.

5. Study will be sent for FFR.

RECOMMENDATIONS:
CAD-RADS 3: Moderate stenosis. Consider symptom-guided anti-ischemic
pharmacotherapy as well as risk factor modification per guideline
directed care. Additional analysis with CT FFR will be submitted.

EXAM:
OVER-READ INTERPRETATION  CT CHEST

The following report is an over-read performed by radiologist Dr.
over-read does not include interpretation of cardiac or coronary
anatomy or pathology. The coronary CTA interpretation by the
cardiologist is attached.
FINDINGS: Vascular: Heart is normal size. Visualized aorta is normal in
caliber. Moderate atherosclerotic disease of the thoracic aorta.

Mediastinum/Nodes: No adenopathy. No pathologically enlarged nodes
seen in the visualized chest. Visualized esophagus is unremarkable.

Lungs/Pleura: Bibasilar atelectasis. Mild paraseptal emphysema. No
consolidation, pleural effusion or pneumothorax.

Upper Abdomen: Imaging into the upper abdomen demonstrates no acute

findings.

Musculoskeletal: Chest wall soft tissues are unremarkable. No acute

bony abnormality.
IMPRESSION: No acute extracardiac abnormality.

*** End of Addendum ***
FINDINGS: Image quality: Average.

Noise artifact is: Limited.

Coronary Arteries:  Normal coronary origin.  Right dominance.

Left main: The left main is a large caliber vessel with a normal
take off from the left coronary cusp that bifurcates to form a left
anterior descending artery and a left circumflex artery. There is
minimal (0-24) calcified plaque.

Left anterior descending artery: The LAD has diffuse, mild (25-49)
calcified plaque in the proximal and mid vessel; there is a moderate
(50-69) mixed plaque stenosis in the mid vessel. The LAD gives off
small D1 and D2 and medium size D3.

Left circumflex artery: The LCX is non-dominant and patent with no
evidence of plaque or stenosis. The LCX gives off 1 large obtuse
marginal branch; there is minimal (0-24) calcified plaque.

Right coronary artery: The RCA is dominant with normal take off from
the right coronary cusp. There is no evidence of plaque or stenosis.
The RCA terminates as a PDA and right posterolateral branch without
evidence of plaque or stenosis.

Right Atrium: Right atrial size is within normal limits.

Right Ventricle: The right ventricular cavity is within normal
limits.

Left Atrium: Left atrial size is normal in size with no left atrial
appendage filling defect.

Left Ventricle: The ventricular cavity size is within normal limits.
There are no stigmata of prior infarction. There is no abnormal
filling defect.

Pulmonary arteries: Normal in size without proximal filling defect.

Pulmonary veins: Normal pulmonary venous drainage.

Pericardium: Normal thickness with no significant effusion or
calcium present.

Cardiac valves: The aortic valve is trileaflet with minimal
calcification. The mitral valve is normal structure without
significant calcification.

Aorta: Normal caliber; aortic atherosclerosis noted.

Extra-cardiac findings: See attached radiology report for
non-cardiac structures.
IMPRESSION: 1. Coronary calcium score of 875. This was 77 percentile for age-,
sex, and race-matched controls.

2. Normal coronary origin with right dominance.

3. Moderate (50-69) mixed plaque stenosis in the mid LAD; remainder
of CAD is mild.

4. Aortic atherosclerosis.

5. Study will be sent for FFR.

RECOMMENDATIONS:
CAD-RADS 3: Moderate stenosis. Consider symptom-guided anti-ischemic
pharmacotherapy as well as risk factor modification per guideline
directed care. Additional analysis with CT FFR will be submitted.

## 2022-09-24 ENCOUNTER — Emergency Department (HOSPITAL_COMMUNITY)
Admission: EM | Admit: 2022-09-24 | Discharge: 2022-09-24 | Disposition: A | Discharge status: Home / Self Care | Payer: Medicare Other | Attending: Emergency Medicine | Admitting: Emergency Medicine

## 2022-09-24 ENCOUNTER — Encounter (HOSPITAL_COMMUNITY): Payer: Self-pay

## 2022-09-24 ENCOUNTER — Other Ambulatory Visit: Payer: Self-pay

## 2022-09-24 ENCOUNTER — Emergency Department (HOSPITAL_COMMUNITY): Payer: Medicare Other

## 2022-09-24 DIAGNOSIS — S61312A Laceration without foreign body of right middle finger with damage to nail, initial encounter: Secondary | ICD-10-CM | POA: Insufficient documentation

## 2022-09-24 DIAGNOSIS — Z23 Encounter for immunization: Secondary | ICD-10-CM | POA: Insufficient documentation

## 2022-09-24 DIAGNOSIS — W232XXA Caught, crushed, jammed or pinched between a moving and stationary object, initial encounter: Secondary | ICD-10-CM | POA: Diagnosis not present

## 2022-09-24 DIAGNOSIS — S61212A Laceration without foreign body of right middle finger without damage to nail, initial encounter: Secondary | ICD-10-CM | POA: Diagnosis present

## 2022-09-24 DIAGNOSIS — S6010XA Contusion of unspecified finger with damage to nail, initial encounter: Secondary | ICD-10-CM

## 2022-09-24 MED ORDER — LIDOCAINE HCL (PF) 2 % IJ SOLN
INTRAMUSCULAR | Status: AC
  Administered 2022-09-24: 5 mL via INTRADERMAL
  Filled 2022-09-24: qty 5

## 2022-09-24 MED ORDER — TETANUS-DIPHTH-ACELL PERTUSSIS 5-2.5-18.5 LF-MCG/0.5 IM SUSY
0.5000 mL | PREFILLED_SYRINGE | Freq: Once | INTRAMUSCULAR | Status: AC
  Administered 2022-09-24: 0.5 mL via INTRAMUSCULAR
  Filled 2022-09-24: qty 0.5

## 2022-09-24 MED ORDER — POVIDONE-IODINE 10 % EX SOLN
CUTANEOUS | Status: DC | PRN
  Administered 2022-09-24: 1 via TOPICAL
  Filled 2022-09-24: qty 14.8

## 2022-09-24 MED ORDER — HYDROCODONE-ACETAMINOPHEN 5-325 MG PO TABS
1.0000 | ORAL_TABLET | Freq: Once | ORAL | Status: AC
  Administered 2022-09-24: 1 via ORAL
  Filled 2022-09-24: qty 1

## 2022-09-24 MED ORDER — LIDOCAINE HCL (PF) 2 % IJ SOLN: 5.0000 mL | Freq: Once | INTRAMUSCULAR | Status: AC

## 2022-09-24 MED ORDER — HYDROCODONE-ACETAMINOPHEN 5-325 MG PO TABS: ORAL_TABLET | ORAL | 0 refills | Status: DC

## 2022-09-24 NOTE — ED Triage Notes (Signed)
Pt caught the left middle finger, distal phalanx in a door, cut to both sides of the finger, happened about an hour ago

## 2022-09-24 NOTE — Discharge Instructions (Signed)
Elevate your hand when possible.  Keep the wound clean with mild soap and water.  Keep it bandaged.  You may wear the finger splint as needed for protection.  Sutures out in 8 to 10 days, return to the emergency department for any signs of infection.  As discussed, you may lose your nail, but you will grow a new nail.  This can take several months

## 2022-09-24 NOTE — ED Provider Notes (Signed)
[name] Provider Note   CSN: [id number] Arrival date & time: 09/24/22  1103     History  Chief Complaint  Patient presents with   Laceration    Jerome Stark is a 77 y.o. male.   Laceration Associated symptoms: no fever         Jerome Stark is a 77 y.o. male who presents to the Emergency Department complaining of injury to the distal right middle finger and fingernail secondary to crush injury.  States that earlier today, he accidentally shut the car door on his finger.  Last Td unknown.  He is controlled bleeding using direct pressure.  He denies any numbness of his finger, he does not take blood thinners.   Home Medications Prior to Admission medications   Medication Sig Start Date End Date Taking? Authorizing Provider  HYDROcodone-acetaminophen (NORCO/VICODIN) 5-325 MG tablet Take one tab po q 4 hrs prn pain 09/24/22  Yes Audry Pecina, PA-C  acetaminophen (TYLENOL) 500 MG tablet Take 1,500-2,000 mg by mouth every 6 (six) hours as needed for moderate pain or headache.    [provider]  amLODipine (NORVASC) 10 MG tablet Take 10 mg by mouth every morning. 12/18/14   [provider]  Ascorbic Acid (VITAMIN C PO) Take 500 mg by mouth daily.    [provider]  co-enzyme Q-10 30 MG capsule Take 30 mg by mouth daily.    [provider]  hydrochlorothiazide (HYDRODIURIL) 25 MG tablet Take 25 mg by mouth daily. 12/18/14   [provider]  levothyroxine (SYNTHROID, LEVOTHROID) 75 MCG tablet Take 75 mcg by mouth daily before breakfast.    [provider]  lisinopril (ZESTRIL) 20 MG tablet Take 20 mg by mouth daily. 03/15/21   [provider]  mesalamine (LIALDA) 1.2 g EC tablet Take 4 tablets (4.8 g total) by mouth daily with breakfast. 11/04/21   Erenest Rasher, PA-C  metoprolol succinate (TOPROL XL) 25 MG 24 hr tablet Take 1 tablet (25 mg total) by mouth daily. 09/17/21    Buford Dresser, MD  nitroGLYCERIN (NITROSTAT) 0.4 MG SL tablet Place 1 tablet (0.4 mg total) under the tongue every 5 (five) minutes as needed for chest pain. 09/17/21 12/16/21  Buford Dresser, MD  rosuvastatin (CRESTOR) 20 MG tablet Take 1 tablet (20 mg total) by mouth daily. 09/17/21 09/12/22  Buford Dresser, MD  VITAMIN D, CHOLECALCIFEROL, PO Take 1,000 Units by mouth daily.    [provider]  zinc gluconate 50 MG tablet Take 50 mg by mouth daily.    [provider]      Allergies    Patient has no known allergies.    Review of Systems   Review of Systems  Constitutional:  Negative for appetite change, chills and fever.  Respiratory:  Negative for shortness of breath.   Cardiovascular:  Negative for chest pain.  Gastrointestinal:  Negative for nausea and vomiting.  Musculoskeletal:  Positive for arthralgias (Pain right middle finger).  Skin:  Positive for wound.       Laceration right middle finger  Neurological:  Negative for dizziness, weakness and numbness.    Physical Exam Updated Vital Signs BP 129/78 (BP Location: Right Arm)   Pulse 72   Temp 98.3 F (36.8 C) (Oral)   Resp 18   Ht '5\' 11"'$  (1.803 m)   Wt 88 kg   SpO2 96%   BMI 27.06 kg/m  Physical Exam Vitals and nursing  note reviewed.  Constitutional:      General: He is not in acute distress.    Appearance: Normal appearance. He is not ill-appearing or toxic-appearing.  Cardiovascular:     Rate and Rhythm: Normal rate and regular rhythm.     Pulses: Normal pulses.  Pulmonary:     Effort: Pulmonary effort is normal.  Musculoskeletal:        General: Tenderness and signs of injury present. No swelling or deformity.     Comments: Crush injury distal tip of the right middle finger.  1.5 cm laceration  to the dorsal aspect of the finger, 1.5 cm laceration to the palmar aspect of the finger as well. Subungual hematoma present without avulsion of the nail.  No foreign bodies.   Skin:    General: Skin is warm.     Capillary Refill: Capillary refill takes less than 2 seconds.  Neurological:     General: No focal deficit present.     Mental Status: He is alert.     Sensory: No sensory deficit.     Motor: No weakness.     ED Results / Procedures / Treatments   Labs (all labs ordered are listed, but only abnormal results are displayed) Labs Reviewed - No data to display  EKG None  Radiology DG Finger Middle Right  Result Date: 09/24/2022 CLINICAL DATA:  Injured middle digit in door 1 hour ago. EXAM: RIGHT MIDDLE FINGER 2+V COMPARISON:  None Available. FINDINGS: Apparent soft tissue laceration about the distal phalanx of the middle digit. No associated fracture or dislocation. No radiopaque foreign body. Joint spaces are grossly preserved. No erosions. IMPRESSION: Apparent soft tissue laceration about the distal phalanx of the middle digit without associated fracture, dislocation or radiopaque foreign body. Electronically Signed   By: Sandi Mariscal M.D.   On: 09/24/2022 12:59    Procedures .Marland KitchenLaceration Repair  Date/Time: 09/24/2022 3:04 PM  Performed by: Kem Parkinson, PA-C Authorized by: Kem Parkinson, PA-C   Consent:    Consent obtained:  Verbal   Consent given by:  Patient   Risks discussed:  Infection and poor wound healing Universal protocol:    Imaging studies available: yes     Patient identity confirmed:  Verbally with patient Anesthesia:    Anesthesia method:  Nerve block   Block needle gauge:  25 G   Block anesthetic:  Lidocaine 2% w/o epi   Block technique:  Digital block   Block injection procedure:  Anatomic landmarks identified, introduced needle, negative aspiration for blood and anatomic landmarks palpated   Block outcome:  Anesthesia achieved Laceration details:    Location:  Finger   Finger location:  R long finger   Length (cm):  1.5 Pre-procedure details:    Preparation:  Patient was prepped and draped in usual sterile  fashion and imaging obtained to evaluate for foreign bodies Exploration:    Limited defect created (wound extended): no     Imaging obtained: x-ray     Imaging outcome: foreign body not noted     Wound exploration: wound explored through full range of motion     Contaminated: no   Treatment:    Area cleansed with:  Povidone-iodine   Amount of cleaning:  Standard   Irrigation solution:  Sterile saline   Irrigation method:  Syringe   Debridement:  None   Undermining:  None Skin repair:    Repair method:  Sutures   Suture size:  4-0   Suture material:  Nylon  Suture technique:  Simple interrupted   Number of sutures:  6 Approximation:    Approximation:  Loose Repair type:    Repair type:  Simple Post-procedure details:    Dressing:  Splint for protection and non-adherent dressing   Procedure completion:  Tolerated well, no immediate complications Comments:     Subungual hematoma  also present, successful trepanation of the nail performed using disposable cautery     Medications Ordered in ED Medications  povidone-iodine (BETADINE) 10 % external solution (1 Application Topical Given 09/24/22 1347)  HYDROcodone-acetaminophen (NORCO/VICODIN) 5-325 MG per tablet 1 tablet (1 tablet Oral Given 09/24/22 1226)  Tdap (BOOSTRIX) injection 0.5 mL (0.5 mLs Intramuscular Given 09/24/22 1348)  lidocaine HCl (PF) (XYLOCAINE) 2 % injection 5 mL (5 mLs Intradermal Given 09/24/22 1350)    ED Course/ Medical Decision Making/ A&P                             Medical Decision Making Patient here for crush injury of his finger.  Does not take blood thinners, no numbness.  Does have subungual hematoma.  Nailbed appears intact.  No avulsion of the nail  Last Td unknown.  Will require laceration repair and trephination of the nail  Amount and/or Complexity of Data Reviewed Radiology: ordered.    Details: X-ray of the finger without evidence for fracture or dislocation. Discussion of management or  test interpretation with external provider(s): Patient tolerated procedure well.  TD updated.  Finger bandaged and splinted for protection.  Given wound care instructions.  Return precautions also discussed.  Risk OTC drugs. Prescription drug management.           Final Clinical Impression(s) / ED Diagnoses Final diagnoses:  Laceration of right middle finger without foreign body with damage to nail, initial encounter  Subungual hematoma of digit of hand, initial encounter    Rx / DC Orders ED Discharge Orders          Ordered    HYDROcodone-acetaminophen (NORCO/VICODIN) 5-325 MG tablet        09/24/22 1502              Kem Parkinson, PA-C 09/24/22 1521    Milton Ferguson, MD 09/26/22 1045

## 2022-09-24 NOTE — ED Triage Notes (Signed)
It is the rt middle finger instead of left.

## 2023-03-19 ENCOUNTER — Ambulatory Visit (HOSPITAL_BASED_OUTPATIENT_CLINIC_OR_DEPARTMENT_OTHER): Payer: Medicare Other | Admitting: Cardiology

## 2023-03-19 ENCOUNTER — Encounter (HOSPITAL_BASED_OUTPATIENT_CLINIC_OR_DEPARTMENT_OTHER): Payer: Self-pay | Admitting: Cardiology

## 2023-03-19 VITALS — BP 138/88 | HR 56 | Ht 71.0 in | Wt 198.1 lb

## 2023-03-19 DIAGNOSIS — E7800 Pure hypercholesterolemia, unspecified: Secondary | ICD-10-CM

## 2023-03-19 DIAGNOSIS — I10 Essential (primary) hypertension: Secondary | ICD-10-CM | POA: Diagnosis not present

## 2023-03-19 DIAGNOSIS — Z8249 Family history of ischemic heart disease and other diseases of the circulatory system: Secondary | ICD-10-CM | POA: Diagnosis not present

## 2023-03-19 DIAGNOSIS — I25118 Atherosclerotic heart disease of native coronary artery with other forms of angina pectoris: Secondary | ICD-10-CM | POA: Diagnosis not present

## 2023-03-19 MED ORDER — AMLODIPINE BESYLATE 5 MG PO TABS: 5.0000 mg | ORAL_TABLET | Freq: Every day | ORAL | 3 refills | Status: DC

## 2023-03-19 NOTE — Patient Instructions (Addendum)
Medication Instructions:  Your blood pressure is higher than I'd like. Goal is to have it consistently less than 130/80. Here are the options we discussed. We are starting with option #1 today: 1) restart amlodipine at a lower dose. You may have some mild swelling in your feet and ankles, but this is part of how the medication works (it relaxes blood vessels). If you get swelling, it would be worse at the end of the day and better in the morning. You can counteract this by avoiding salt, elevated your feet when you are sitting, or wearing compression stockings. If the swelling is anything more than mild, call me and we will make a change 2) changing metoprolol to carvedilol. Carvedilol is a better BP drug, but we want to make sure we don't drop your heart rate too much. This is an option in the future if we need it. 3). Adding hydralazine. As we discussed, this works best as a three times a day medication, which is difficult to do. Hydralazine also relaxes blood vessels, which can lead to some swelling (like amlodipine). Given this, we will avoid it for now.  I cannot see your recent kidney function results, but given your comment re: concern for this, I do not want to change the lisinopril or add a medication that might affect the kidneys.  Please check your blood pressure and call us/mychart us/fax [place] results in about 3 weeks. If you have any issues or problems, please let us know ASAP.   Follow-Up: At [facility], you and your health needs are our priority.  As part of our continuing mission to provide you with exceptional heart care, we have created designated Provider Care Teams.  These Care Teams include your primary Cardiologist (physician) and Advanced Practice Providers (APPs -  Physician Assistants and Nurse Practitioners) who all work together to provide you with the care you need, when you need it.  We recommend signing up for the patient portal called "MyChart".  Sign up  information is provided on this After Visit Summary.  MyChart is used to connect with patients for Virtual Visits (Telemedicine).  Patients are able to view lab/test results, encounter notes, upcoming appointments, etc.  Non-urgent messages can be sent to your provider as well.   To learn more about what you can do with MyChart, go to [email or web address].    Your next appointment:   1 year with Dr. Cristal Deer

## 2023-03-19 NOTE — Progress Notes (Signed)
Cardiology Office Note:  .    Date:  03/19/2023  ID:  Jerome Stark, DOB 1945-10-27, MRN [id number] PCP: [facility] Providers Cardiologist:  Jodelle Red, MD     History of Present Illness: .    Jerome Stark is a 77 y.o. male with a hx of hypertension, hyperlipidemia, hypothyroidism, and ulcerative colitis, who is seen for follow-up. He was initially seen 08/28/21 for post hospital follow-up for chest pain.   Cardiac history: He presented to the ED 08/06/21 following 2 episodes of substernal chest pain described as a burning sensation radiating down to the right nipple. He had 2 more episodes while waiting in the lobby, but had no active chest pain during his evaluation. Troponin and delta troponin were negative, and his heart score was 3. CT PE was negative. He was discharged with outpatient cardiology follow-up.   Coronary calcium on CT scan. He is open to increasing his rosuvastatin. He had taken aspirin in the past but it was discontinued due to burst blood vessels. He prefers trying medication first rather than undergoing a catheterization.    His younger brother had a stent placed and was on a statin several years ago. His mother had open heart surgery when she was 76 years old.   At his visit 12/2021, he reported home blood pressures mostly in the 110s/70s. He continued to work on improving his diet. He had rare non-exertional chest pressure, felt like it was hard to take a deep breath. Was going to transfer from [place] to [place].  Today, he states he has been doing well aside from recent higher blood pressures. At [facility], his medications have been adjusted and he also presents a BP log which is personally reviewed. Since June, his blood pressures have been more elevated on average. He is no longer on HCTZ or amlodipine; he was having significant issues with LE edema which is why amlodipine was discontinued. Bumex was initiated.   No  bradycardic heart rates, no loss of consciousness or presyncope. He denies any palpitations, chest pain, shortness of breath, lightheadedness, headaches, orthopnea, or PND.  ROS:  Please see the history of present illness. ROS otherwise negative except as noted.   Studies Reviewed: Marland Kitchen    EKG Interpretation Date/Time:  Thursday March 19 2023 14:04:12 EDT Ventricular Rate:  57 PR Interval:  160 QRS Duration:  100 QT Interval:  454 QTC Calculation: 441 R Axis:   -17  Text Interpretation: Sinus bradycardia When compared with ECG of 06-Aug-2021 09:23, Premature atrial complexes are no longer Present Vent. rate has decreased BY  38 BPM Confirmed by Jodelle Red [phone]) on 03/19/2023 2:20:13 PM    Physical Exam:    VS:  BP 138/88 (BP Location: Left Arm, Patient Position: Sitting, Cuff Size: Normal)   Pulse (!) 56   Ht 5\' 11"  (1.803 m)   Wt 198 lb 1.6 oz (89.9 kg)   SpO2 95%   BMI 27.63 kg/m    Wt Readings from Last 3 Encounters:  03/19/23 198 lb 1.6 oz (89.9 kg)  09/24/22 194 lb (88 kg)  12/16/21 199 lb 3.2 oz (90.4 kg)    GEN: Well nourished, well developed in no acute distress HEENT: Normal, moist mucous membranes NECK: No JVD CARDIAC: regular rhythm, normal S1 and S2, no rubs or gallops. No murmur. VASCULAR: Radial and DP pulses 2+ bilaterally. No carotid bruits RESPIRATORY:  Clear to auscultation without rales, wheezing or rhonchi  ABDOMEN:  Soft, non-tender, non-distended MUSCULOSKELETAL:  Ambulates independently SKIN: Warm and dry, no edema NEUROLOGIC:  Alert and oriented x 3. No focal neuro deficits noted. PSYCHIATRIC:  Normal affect   ASSESSMENT AND PLAN: .    Coronary artery disease, with atypical angina Aortic atherosclerosis Family history of heart disease -continue rosuvastatin -we discussed aspirin. He has history of bleeding. Understands that if he gets a stent in the future, he would need DAPT, but he would like to hold on aspirin for now -we  discussed red flag warning signs that need immediate medical attention -discussed how to use SL NG, discussed risk with PDE5 inhibitors   Hypertension: has been elevated. He was taking off amlodipine 10 mg, presumably for edema, and he also had his hydrochlorothiazide changed to bumex.  We discussed the following: Your blood pressure is higher than I'd like. Goal is to have it consistently less than 130/80. Here are the options we discussed. We are starting with option #1 today: 1) restart amlodipine at a lower dose. You may have some mild swelling in your feet and ankles, but this is part of how the medication works (it relaxes blood vessels). If you get swelling, it would be worse at the end of the day and better in the morning. You can counteract this by avoiding salt, elevated your feet when you are sitting, or wearing compression stockings. If the swelling is anything more than mild, call me and we will make a change 2) changing metoprolol to carvedilol. Carvedilol is a better BP drug, but we want to make sure we don't drop your heart rate too much. This is an option in the future if we need it. 3). Adding hydralazine. As we discussed, this works best as a three times a day medication, which is difficult to do. Hydralazine also relaxes blood vessels, which can lead to some swelling (like amlodipine). Given this, we will avoid it for now.  I cannot see your recent kidney function results, but given your comment re: concern for this, I do not want to change the lisinopril or add a medication that might affect the kidneys.  Please check your blood pressure and call us/mychart us/fax [place] results in about 3 weeks. If you have any issues or problems, please let us know ASAP.   Cardiac risk counseling and prevention recommendations: -recommend heart healthy/Mediterranean diet, with whole grains, fruits, vegetable, fish, lean meats, nuts, and olive oil. Limit salt. -recommend moderate walking, 3-5  times/week for 30-50 minutes each session. Aim for at least 150 minutes.week. Goal should be pace of 3 miles/hours, or walking 1.5 miles in 30 minutes -recommend avoidance of tobacco products. Avoid excess alcohol.  Dispo: Follow-up in 1 year, or sooner as needed.  I,Mathew Stumpf,acting as a Neurosurgeon for [employer], MD.,have documented all relevant documentation on the behalf of Jodelle Red, MD,as directed by  Jodelle Red, MD while in the presence of Jodelle Red, MD.  I, Jodelle Red, MD, have reviewed all documentation for this visit. The documentation on 03/19/23 for the exam, diagnosis, procedures, and orders are all accurate and complete.   Signed, Jodelle Red, MD

## 2023-07-22 ENCOUNTER — Encounter (HOSPITAL_COMMUNITY): Payer: Self-pay | Admitting: Emergency Medicine

## 2023-07-22 ENCOUNTER — Emergency Department (HOSPITAL_COMMUNITY)
Admission: EM | Admit: 2023-07-22 | Discharge: 2023-07-22 | Disposition: A | Discharge status: Home / Self Care | Payer: Medicare Other | Attending: Emergency Medicine | Admitting: Emergency Medicine

## 2023-07-22 ENCOUNTER — Other Ambulatory Visit: Payer: Self-pay

## 2023-07-22 DIAGNOSIS — Z79899 Other long term (current) drug therapy: Secondary | ICD-10-CM | POA: Diagnosis not present

## 2023-07-22 DIAGNOSIS — L03211 Cellulitis of face: Secondary | ICD-10-CM | POA: Diagnosis not present

## 2023-07-22 DIAGNOSIS — I10 Essential (primary) hypertension: Secondary | ICD-10-CM | POA: Insufficient documentation

## 2023-07-22 DIAGNOSIS — R220 Localized swelling, mass and lump, head: Secondary | ICD-10-CM | POA: Diagnosis present

## 2023-07-22 MED ORDER — AMOXICILLIN-POT CLAVULANATE 875-125 MG PO TABS: 1.0000 | ORAL_TABLET | Freq: Two times a day (BID) | ORAL | 0 refills | Status: DC

## 2023-07-22 NOTE — Discharge Instructions (Signed)
You were seen for skin infection (cellulitis) in the emergency department.   At home, please take the antibiotics (augmentin) we have prescribed you.  You may also take tylenol for any pain that you have.   Check your MyChart online for the results of any tests that had not resulted by the time you left the emergency department.   Follow-up with your primary doctor in 2-3 days regarding your visit.    Return immediately to the emergency department if you experience any of the following: fevers, severe pain, rapid spread of the rash/redness, or any other concerning symptoms.    Thank you for visiting our Emergency Department. It was a pleasure taking care of you today.

## 2023-07-22 NOTE — ED Triage Notes (Addendum)
Noticed small red spot laterally to left eye 3 days ago. Has now spread around eye per pt with some itching. Redness noted under left eye to cheek and to lateral area. Mild swelling noted. Denies dental pain. States has a headache as well. nad

## 2023-07-22 NOTE — ED Provider Notes (Signed)
[place] EMERGENCY DEPARTMENT AT [facility] Provider Note   CSN: [id number] Arrival date & time: 07/22/23  5643     History  Chief Complaint  Patient presents with   Facial Swelling    Jerome Stark is a 77 y.o. male.  77 year old male with a history of hypertension and hyperlipidemia who presents to the emergency department with left-sided facial pain and swelling.  For the past 3 days has had swelling and pain on the left cheek.  Says that it started as a small bump underneath his left eye.  No fevers.  No vision changes or pain with eye movement.  Mild headache.        Home Medications Prior to Admission medications   Medication Sig Start Date End Date Taking? Authorizing Provider  amoxicillin-clavulanate (AUGMENTIN) 875-125 MG tablet Take 1 tablet by mouth every 12 (twelve) hours. 07/22/23  Yes Rondel Baton, MD  acetaminophen (TYLENOL) 500 MG tablet Take 1,500-2,000 mg by mouth every 6 (six) hours as needed for moderate pain or headache.    [provider]  amLODipine (NORVASC) 5 MG tablet Take 1 tablet (5 mg total) by mouth daily. 03/19/23 06/17/23  Jodelle Red, MD  Ascorbic Acid (VITAMIN C PO) Take 500 mg by mouth daily.    [provider]  bumetanide (BUMEX) 0.5 MG tablet Take 0.5 mg by mouth daily.    [provider]  co-enzyme Q-10 30 MG capsule Take 30 mg by mouth daily.    [provider]  ferrous sulfate 325 (65 FE) MG EC tablet Take 325 mg by mouth 3 (three) times a week.    [provider]  fluocinonide cream (LIDEX) 0.05 % Apply 1 Application topically 2 (two) times daily as needed.    [provider]  HYDROcodone-acetaminophen (NORCO/VICODIN) 5-325 MG tablet Take one tab po q 4 hrs prn pain 09/24/22   Triplett, Tammy, PA-C  levothyroxine (SYNTHROID, LEVOTHROID) 75 MCG tablet Take 75 mcg by mouth daily before breakfast.    [provider]  lisinopril (ZESTRIL) 20 MG tablet  Take 20 mg by mouth daily. 03/15/21   [provider]  mesalamine (LIALDA) 1.2 g EC tablet Take 4 tablets (4.8 g total) by mouth daily with breakfast. 11/04/21   Letta Median, PA-C  metoprolol succinate (TOPROL XL) 25 MG 24 hr tablet Take 1 tablet (25 mg total) by mouth daily. 09/17/21   Jodelle Red, MD  nitroGLYCERIN (NITROSTAT) 0.4 MG SL tablet Place 1 tablet (0.4 mg total) under the tongue every 5 (five) minutes as needed for chest pain. 09/17/21 03/19/23  Jodelle Red, MD  rosuvastatin (CRESTOR) 20 MG tablet Take 1 tablet (20 mg total) by mouth daily. 09/17/21 03/19/23  Jodelle Red, MD  VITAMIN D, CHOLECALCIFEROL, PO Take 1,000 Units by mouth daily.    [provider]  zinc gluconate 50 MG tablet Take 50 mg by mouth daily.    [provider]      Allergies    Patient has no known allergies.    Review of Systems   Review of Systems  Physical Exam Updated Vital Signs BP (!) 165/88   Pulse (!) 56   Temp 97.9 F (36.6 C) (Oral)   Resp 17   SpO2 97%  Physical Exam Constitutional:      Appearance: Normal appearance.  HENT:     Head: Atraumatic.     Comments: Rash on left cheek.  No vesicles noted.    Right Ear:  External ear normal.     Left Ear: External ear normal.     Nose: Nose normal.  Eyes:     Extraocular Movements: Extraocular movements intact.     Conjunctiva/sclera: Conjunctivae normal.     Pupils: Pupils are equal, round, and reactive to light.     Comments: Pupils 5 mm bilaterally  Neurological:     Mental Status: He is alert.   Left face:   ED Results / Procedures / Treatments   Labs (all labs ordered are listed, but only abnormal results are displayed) Labs Reviewed - No data to display  EKG None  Radiology No results found.  Procedures Procedures    Medications Ordered in ED Medications - No data to display  ED Course/ Medical Decision Making/ A&P                                  Medical Decision Making Risk Prescription drug management.   Jerome Stark is a 77 y.o. male with comorbidities that complicate the patient evaluation including hypertension and hyperlipidemia who presents to the emergency department with left-sided facial pain and swelling  Initial Ddx:  Cellulitis, preseptal cellulitis, orbital cellulitis, shingles  MDM/Course:  Patient presents to the emergency department with swelling and pain and erythema of the left side of his face.  Does not appear to go circumferentially around the eye.  No ocular symptoms to suggest orbital cellulitis.  No vesicular appearance that would suggest shingles.  Feel that he likely has cellulitis of his face.  Was prescribed Augmentin for his rash and to prevent any preseptal cellulitis from occurring.  Will him follow-up with his primary doctor in several days.    This patient presents to the ED for concern of complaints listed in HPI, this involves an extensive number of treatment options, and is a complaint that carries with it a high risk of complications and morbidity. Disposition including potential need for admission considered.   Dispo: DC Home. Return precautions discussed including, but not limited to, those listed in the AVS. Allowed pt time to ask questions which were answered fully prior to dc.  Records reviewed Outpatient Clinic Notes I have reviewed the patients home medications and made adjustments as needed Social Determinants of health:  Elderly  Portions of this note were generated with Scientist, clinical (histocompatibility and immunogenetics). Dictation errors may occur despite best attempts at proofreading.     Final Clinical Impression(s) / ED Diagnoses Final diagnoses:  Cellulitis of face    Rx / DC Orders ED Discharge Orders          Ordered    amoxicillin-clavulanate (AUGMENTIN) 875-125 MG tablet  Every 12 hours        07/22/23 0945              Rondel Baton, MD 07/22/23 2037

## 2023-08-05 NOTE — Progress Notes (Signed)
 Referring Provider: [facility] Primary Care Physician:  [facility] Primary GI Physician: Dr. Shaaron  Chief Complaint  Patient presents with   Follow-up    Follow up. No problems     HPI:   Jerome Stark is a 78 y.o. 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    Last seen in our office 11/04/2021.  He was doing well on Lialda  4.8 g daily with no significant GI symptoms whatsoever.  He was due for routine labs and planned to have these completed at the [facility].  He was up-to-date on his vaccines aside from influenza vaccine which he has declined.  He was also in need of a bone density scan as he has never had 1.   Received lab results from the [facility] dated 01/02/22:  He was found to have low iron saturation of 17.7%, ferritin normal at 200.2, iron normal at 58.  B12 320, folate greater than 20.  No CBC included. (Prior Hgb 16.0 January 2023) LFTs within normal limits.  Kidney function within normal limits.  Vitamin D  and TSH within normal limits. [facility] provider recommended patient start ferrous sulfate 325 mg 3 days a week. Patient reported no overt GI bleeding or change in bowel habits.    DEXA 11/12/2021: Normal.     Today:  Typically with 2 Bms per day. After eating lunch and dinner, he feels like he has to go to the bathroom, but not terribly and often will just wait until later to go to the bathroom when it is more convenient, but he reports this sensation is new to him. No brbpr. Stools are dark on iron. No abdominal pain. No other GI concerns.   NSAIDs: None.  Tobacco use: None.   Vaccines:  Influenza: States he doesn't receive the flu vaccine.   Pneumococcal: Has received 2 doses including the newest vaccine.  Shingles: Yes.    Was due for surveillance colonoscopy in April 2024 (8 years after diagnosis).  States he will be going for blood work at the [facility] in February.   Past  Medical History:  Diagnosis Date   Hyperlipidemia    Hypertension    Hypothyroidism    Proctocolitis    On Lialda    Ulcerative colitis (HCC)    On Lialda     Past Surgical History:  Procedure Laterality Date   BACK SURGERY     lower back / lumbar    COLONOSCOPY  2003   Dr. Shaaron: idiopathic proctitis   COLONOSCOPY  10/2013   Dr. Donnel: diminutive hyperplastic polyp. scope advanced to the cecum, no intubation of TI, no random biopsies performed   COLONOSCOPY N/A 11/29/2014   Dr. Shaaron: left-sided proctocolitis; surveillance due in 2024   KNEE ARTHROPLASTY Right 01/08/2017   Procedure: RIGHT TOTAL KNEE ARTHROPLASTY WITH COMPUTER NAVIGATION;  Surgeon: Fidel Rogue, MD;  Location: [facility];  Service: Orthopedics;  Laterality: Right;  Needs RNFA; Adductor block   KNEE ARTHROPLASTY Left 02/26/2017   Procedure: LEFT TOTAL KNEE ARTHROPLASTY;  Surgeon: Fidel Rogue, MD;  Location: [facility];  Service: Orthopedics;  Laterality: Left;  Needs RNFA   LUNG SURGERY     collapsed lung; bilateral per patient they called it a bleb that burst     Current Outpatient Medications  Medication Sig Dispense Refill   acetaminophen  (TYLENOL ) 500 MG tablet Take 1,500-2,000 mg by mouth every 6 (six) hours as needed for moderate pain  or headache.     amLODipine  (NORVASC ) 5 MG tablet Take 1 tablet (5 mg total) by mouth daily. 90 tablet 3   Ascorbic Acid (VITAMIN C PO) Take 500 mg by mouth daily.     bumetanide (BUMEX) 0.5 MG tablet Take 0.5 mg by mouth daily.     co-enzyme Q-10 30 MG capsule Take 30 mg by mouth daily.     ferrous sulfate 325 (65 FE) MG EC tablet Take 325 mg by mouth 3 (three) times a week.     fluocinonide cream (LIDEX) 0.05 % Apply 1 Application topically 2 (two) times daily as needed.     levothyroxine  (SYNTHROID , LEVOTHROID) 75 MCG tablet Take 75 mcg by mouth daily before breakfast.     lisinopril (ZESTRIL) 20 MG tablet Take 20 mg by mouth daily.     mesalamine  (LIALDA ) 1.2 g EC  tablet Take 4 tablets (4.8 g total) by mouth daily with breakfast. 360 tablet 1   metoprolol  succinate (TOPROL  XL) 25 MG 24 hr tablet Take 1 tablet (25 mg total) by mouth daily. 90 tablet 3   rosuvastatin  (CRESTOR ) 20 MG tablet Take 1 tablet (20 mg total) by mouth daily. 90 tablet 3   VITAMIN D , CHOLECALCIFEROL , PO Take 1,000 Units by mouth daily.     zinc gluconate 50 MG tablet Take 50 mg by mouth daily.     HYDROcodone -acetaminophen  (NORCO/VICODIN) 5-325 MG tablet Take one tab po q 4 hrs prn pain (Patient not taking: Reported on 08/06/2023) 8 tablet 0   nitroGLYCERIN  (NITROSTAT ) 0.4 MG SL tablet Place 1 tablet (0.4 mg total) under the tongue every 5 (five) minutes as needed for chest pain. (Patient not taking: Reported on 08/06/2023) 90 tablet 3   No current facility-administered medications for this visit.    Allergies as of 08/06/2023   (No Known Allergies)    Family History  Problem Relation Age of Onset   Hypertension Mother    Heart disease Mother    Heart attack Brother        coronary artery stent at age 1   Heart attack Maternal Grandfather        died of MI in late 54s   Hypertension Other    Colon cancer Neg Hx    Inflammatory bowel disease Neg Hx     Social History   Socioeconomic History   Marital status: Married    Spouse name: Not on file   Number of children: Not on file   Years of education: Not on file   Highest education level: Not on file  Occupational History   Not on file  Tobacco Use   Smoking status: Former    Current packs/day: 0.00    Types: Cigarettes    Quit date: 02/14/1985    Years since quitting: 38.4   Smokeless tobacco: Never   Tobacco comments:    smoked 1 ppd for 25 years, quit at age 61  Vaping Use   Vaping status: Never Used  Substance and Sexual Activity   Alcohol  use: Yes    Alcohol /week: 0.0 standard drinks of alcohol     Comment: occasional wine   Drug use: No   Sexual activity: Not on file  Other Topics Concern   Not on  file  Social History Narrative   Not on file   Social Drivers of Health   Financial Resource Strain: Not on file  Food Insecurity: Not on file  Transportation Needs: Not on file  Physical Activity: Not on  file  Stress: Not on file  Social Connections: Not on file    Review of Systems: Gen: Denies fever, chills, cold or flulike symptoms, presyncope, syncope. CV: Denies chest pain, palpitations. Resp: Denies dyspnea, cough. GI: See HPI Heme: See HPI  Physical Exam: BP 138/77 (BP Location: Right Arm, Patient Position: Sitting, Cuff Size: Large)   Pulse 60   Temp 97.8 F (36.6 C) (Temporal)   Ht 5' 11 (1.803 m)   Wt 201 lb 3.2 oz (91.3 kg)   BMI 28.06 kg/m  General:  Alert and oriented. No distress noted. Pleasant and cooperative.  Head:  Normocephalic and atraumatic. Eyes:  Conjuctiva clear without scleral icterus. Heart:  S1, S2 present without murmurs appreciated. Lungs:  Clear to auscultation bilaterally. No wheezes, rales, or rhonchi. No distress.  Abdomen:  +BS, soft, non-tender and non-distended. No rebound or guarding. No HSM or masses noted. Msk:  Symmetrical without gross deformities. Normal posture. Extremities:  Without edema. Neurologic:  Alert and  oriented x4 Psych:  Normal mood and affect.    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  Currently he is doing fairly well on Lialda  4.8 g daily.  Reports a new sensation of needing to have a bowel movement after eating lunch and dinner, but typically will just wait until it is more convenient. Total of 2 bowel movements per day.  No BRBPR, unintentional weight loss, abdominal pain.  Stools are dark on oral iron.  No recent labs on file.  [facility].  Vaccines are up-to-date aside from influenza which he chooses not to receive.  DEXA  normal in April 2023.  He is overdue for surveillance colonoscopy.  He was due in April 2024, 8 years after his initial diagnosis.  We will get this scheduled in the near future.   Plan:  Proceed with colonoscopy with propofol  by Dr. Shaaron in near future. The risks, benefits, and alternatives have been discussed with the patient in detail. The patient states understanding and desires to proceed.  ASA 3 Hold oral iron x 7 days prior to procedure. Patient is to call back with updated medication list prior to scheduling colonoscopy as he was unsure of his other medications today. Continue Lialda  4.8 g daily. Add CRP to upcoming blood work with the [facility] in February.  Requested results are faxed to our office. Follow-up in 6 months.   Josette Centers, PA-C [facility] 08/06/2023

## 2023-08-06 ENCOUNTER — Encounter: Payer: Self-pay | Admitting: Gastroenterology

## 2023-08-06 ENCOUNTER — Ambulatory Visit: Payer: Medicare Other | Admitting: Gastroenterology

## 2023-08-06 VITALS — BP 138/77 | HR 60 | Temp 97.8°F | Ht 71.0 in | Wt 201.2 lb

## 2023-08-06 DIAGNOSIS — K5150 Left sided colitis without complications: Secondary | ICD-10-CM

## 2023-08-06 DIAGNOSIS — K5180 Other ulcerative colitis without complications: Secondary | ICD-10-CM

## 2023-08-06 DIAGNOSIS — I25118 Atherosclerotic heart disease of native coronary artery with other forms of angina pectoris: Secondary | ICD-10-CM

## 2023-08-06 NOTE — Patient Instructions (Signed)
 Continue taking Lialda  4.8 g daily.  Keep plans for blood work with the [facility] in February.  Please asked that they fax the results back to our office with attention to [facility], PA-C.  Fax number is [phone].  I would like for them to check a CRP at the time of your blood work.  I am placing an order for this today that you can take with you.  Please call our office back when you get home with your medication list.  We need to get you scheduled for a colonoscopy, but I need to make sure that we have your medication list updated.  It was great to see you again today!  Josette Centers, PA-C [facility]

## 2023-08-12 ENCOUNTER — Telehealth: Payer: Self-pay | Admitting: *Deleted

## 2023-08-12 NOTE — Telephone Encounter (Signed)
 Attempted to call pt, vm states that not accepting calls at this time, try again later   TCS w/Dr.Rourk, asa 3, pt  needs to update his medication list prior to scheduling.

## 2023-08-13 NOTE — Telephone Encounter (Signed)
 Called pt to schedule him but needed an updated medication list. Pt states he is at work and would be able to call back with an updated list . Advised pt to call with updated list and then we can schedule him. Verbalized understanding.

## 2023-08-18 ENCOUNTER — Other Ambulatory Visit: Payer: Self-pay | Admitting: *Deleted

## 2023-08-18 ENCOUNTER — Encounter: Payer: Self-pay | Admitting: *Deleted

## 2023-08-18 DIAGNOSIS — I25118 Atherosclerotic heart disease of native coronary artery with other forms of angina pectoris: Secondary | ICD-10-CM

## 2023-08-18 MED ORDER — PEG 3350-KCL-NA BICARB-NACL 420 G PO SOLR: 4000.0000 mL | Freq: Once | ORAL | 0 refills | Status: AC

## 2023-08-18 NOTE — Telephone Encounter (Signed)
 Pt called back and updated medication list. He has been scheduled for 09/30/23. Instructions mailed and prep sent to the pharmacy

## 2023-08-18 NOTE — Addendum Note (Signed)
 Addended by: Elinor Dodge on: 08/18/2023 03:38 PM   Modules accepted: Orders

## 2023-08-18 NOTE — Telephone Encounter (Signed)
 [employer] PA:   Notification or Prior Authorization is not required for the requested services You are not required to submit a notification/prior authorization based on the information provided. If you have general questions about the prior authorization requirements, visit [email or web address] > Clinician Resources > Advance and Admission Notification Requirements. The number above acknowledges your notification. Please write this reference number down for future reference. If you would like to request an organization determination, please call us  at [phone]. Decision ID #: [id number]

## 2023-09-25 NOTE — Patient Instructions (Signed)
Jerome Stark  09/25/2023     @PREFPERIOPPHARMACY @   Your procedure is scheduled on  09/30/2023.   [facility] to [facility] at  1045  A.M.   Call this number if you have problems the morning of surgery:  [phone]  If you experience any cold or flu symptoms such as cough, fever, chills, shortness of breath, etc. between now and your scheduled surgery, please notify us at the above number.   Remember:  Follow the diet and prep instructions given to you by the office.   You may drink clear liquids until  0845  am on 09/30/2023.    Clear liquids allowed are:                    Water, Juice (No red color; non-citric and without pulp; diabetics please choose diet or no sugar options), Carbonated beverages (diabetics please choose diet or no sugar options), Clear Tea (No creamer, milk, or cream, including half & half and powdered creamer), Black Coffee Only (No creamer, milk or cream, including half & half and powdered creamer), and Clear Sports drink (No red color; diabetics please choose diet or no sugar options)    Take these medicines the morning of surgery with A SIP OF WATER        amlodipine, levothyroxine, mesalmine, metoprolol.    Do not wear jewelry, make-up or nail polish, including gel polish,  artificial nails, or any other type of covering on natural nails (fingers and  toes).  Do not wear lotions, powders, or perfumes, or deodorant.  Do not shave 48 hours prior to surgery.  Men may shave face and neck.  Do not bring valuables to the hospital.  [facility] is not responsible for any belongings or valuables.  Contacts, dentures or bridgework may not be worn into surgery.  Leave your suitcase in the car.  After surgery it may be brought to your room.  For patients admitted to the hospital, discharge time will be determined by your treatment team.  Patients discharged the day of surgery will not be allowed to drive home and must have someone with them for 24 hours.     Special instructions:  DO NOT smoke tobacco or vape for 24 hours before your procedure.  Please read over the following fact sheets that you were given. Anesthesia Post-op Instructions and Care and Recovery After Surgery      Colonoscopy, Adult, Care After The following information offers guidance on how to care for yourself after your procedure. Your health care provider may also give you more specific instructions. If you have problems or questions, contact your health care provider. What can I expect after the procedure? After the procedure, it is common to have: A small amount of blood in your stool for 24 hours after the procedure. Some gas. Mild cramping or bloating of your abdomen. Follow these instructions at home: Eating and drinking  Drink enough fluid to keep your urine pale yellow. Follow instructions from your health care provider about eating or drinking restrictions. Resume your normal diet as told by your health care provider. Avoid heavy or fried foods that are hard to digest. Activity Rest as told by your health care provider. Avoid sitting for a long time without moving. Get up to take short walks every 1-2 hours. This is important to improve blood flow and breathing. Ask for help if you feel weak or unsteady. Return to your normal activities  as told by your health care provider. Ask your health care provider what activities are safe for you. Managing cramping and bloating  Try walking around when you have cramps or feel bloated. If directed, apply heat to your abdomen as told by your health care provider. Use the heat source that your health care provider recommends, such as a moist heat pack or a heating pad. Place a towel between your skin and the heat source. Leave the heat on for 20-30 minutes. Remove the heat if your skin turns bright red. This is especially important if you are unable to feel pain, heat, or cold. You have a greater risk of getting  burned. General instructions If you were given a sedative during the procedure, it can affect you for several hours. Do not drive or operate machinery until your health care provider says that it is safe. For the first 24 hours after the procedure: Do not sign important documents. Do not drink alcohol. Do your regular daily activities at a slower pace than normal. Eat soft foods that are easy to digest. Take over-the-counter and prescription medicines only as told by your health care provider. Keep all follow-up visits. This is important. Contact a health care provider if: You have blood in your stool 2-3 days after the procedure. Get help right away if: You have more than a small spotting of blood in your stool. You have large blood clots in your stool. You have swelling of your abdomen. You have nausea or vomiting. You have a fever. You have increasing pain in your abdomen that is not relieved with medicine. These symptoms may be an emergency. Get help right away. Call 911. Do not wait to see if the symptoms will go away. Do not drive yourself to the hospital. Summary After the procedure, it is common to have a small amount of blood in your stool. You may also have mild cramping and bloating of your abdomen. If you were given a sedative during the procedure, it can affect you for several hours. Do not drive or operate machinery until your health care provider says that it is safe. Get help right away if you have a lot of blood in your stool, nausea or vomiting, a fever, or increased pain in your abdomen. This information is not intended to replace advice given to you by your health care provider. Make sure you discuss any questions you have with your health care provider. Document Revised: 09/02/2022 Document Reviewed: 03/13/2021 Elsevier Patient Education  2024 Else[employer].Monitored Anesthesia Care, Care After The following information offers guidance on how to care for yourself  after your procedure. Your health care provider may also give you more specific instructions. If you have problems or questions, contact your health care provider. What can I expect after the procedure? After the procedure, it is common to have: Tiredness. Little or no memory about what happened during or after the procedure. Impaired judgment when it comes to making decisions. Nausea or vomiting. Some trouble with balance. Follow these instructions at home: For the time period you were told by your health care provider:  Rest. Do not participate in activities where you could fall or become injured. Do not drive or use machinery. Do not drink alcohol. Do not take sleeping pills or medicines that cause drowsiness. Do not make important decisions or sign legal documents. Do not take care of children on your own. Medicines Take over-the-counter and prescription medicines only as told by your health care provider.  If you were prescribed antibiotics, take them as told by your health care provider. Do not stop using the antibiotic even if you start to feel better. Eating and drinking Follow instructions from your health care provider about what you may eat and drink. Drink enough fluid to keep your urine pale yellow. If you vomit: Drink clear fluids slowly and in small amounts as you are able. Clear fluids include water, ice chips, low-calorie sports drinks, and fruit juice that has water added to it (diluted fruit juice). Eat light and bland foods in small amounts as you are able. These foods include bananas, applesauce, rice, lean meats, toast, and crackers. General instructions  Have a responsible adult stay with you for the time you are told. It is important to have someone help care for you until you are awake and alert. If you have sleep apnea, surgery and some medicines can increase your risk for breathing problems. Follow instructions from your health care provider about wearing your  sleep device: When you are sleeping. This includes during daytime naps. While taking prescription pain medicines, sleeping medicines, or medicines that make you drowsy. Do not use any products that contain nicotine or tobacco. These products include cigarettes, chewing tobacco, and vaping devices, such as e-cigarettes. If you need help quitting, ask your health care provider. Contact a health care provider if: You feel nauseous or vomit every time you eat or drink. You feel light-headed. You are still sleepy or having trouble with balance after 24 hours. You get a rash. You have a fever. You have redness or swelling around the IV site. Get help right away if: You have trouble breathing. You have new confusion after you get home. These symptoms may be an emergency. Get help right away. Call 911. Do not wait to see if the symptoms will go away. Do not drive yourself to the hospital. This information is not intended to replace advice given to you by your health care provider. Make sure you discuss any questions you have with your health care provider. Document Revised: 12/16/2021 Document Reviewed: 12/16/2021 Elsevier Patient Education  2024 [employer].

## 2023-09-28 ENCOUNTER — Encounter (HOSPITAL_COMMUNITY): Payer: Self-pay

## 2023-09-28 ENCOUNTER — Encounter (HOSPITAL_COMMUNITY)
Admission: RE | Admit: 2023-09-28 | Discharge: 2023-09-28 | Disposition: A | Discharge status: Home / Self Care | Payer: Medicare Other | Source: Ambulatory Visit | Attending: Internal Medicine | Admitting: Internal Medicine

## 2023-09-28 VITALS — BP 120/63 | HR 54 | Temp 97.9°F | Resp 18 | Ht 71.0 in | Wt 201.3 lb

## 2023-09-28 DIAGNOSIS — Z862 Personal history of diseases of the blood and blood-forming organs and certain disorders involving the immune mechanism: Secondary | ICD-10-CM | POA: Diagnosis not present

## 2023-09-28 DIAGNOSIS — Z01812 Encounter for preprocedural laboratory examination: Secondary | ICD-10-CM | POA: Diagnosis present

## 2023-09-28 LAB — CBC WITH DIFFERENTIAL/PLATELET
Abs Immature Granulocytes: 0.01 10*3/uL (ref 0.00–0.07)
Basophils Absolute: 0 10*3/uL (ref 0.0–0.1)
Basophils Relative: 0 %
Eosinophils Absolute: 0.3 10*3/uL (ref 0.0–0.5)
Eosinophils Relative: 3 %
HCT: 44.4 % (ref 39.0–52.0)
Hemoglobin: 14.9 g/dL (ref 13.0–17.0)
Immature Granulocytes: 0 %
Lymphocytes Relative: 28 %
Lymphs Abs: 2.1 10*3/uL (ref 0.7–4.0)
MCH: 30.7 pg (ref 26.0–34.0)
MCHC: 33.6 g/dL (ref 30.0–36.0)
MCV: 91.5 fL (ref 80.0–100.0)
Monocytes Absolute: 0.8 10*3/uL (ref 0.1–1.0)
Monocytes Relative: 11 %
Neutro Abs: 4.2 10*3/uL (ref 1.7–7.7)
Neutrophils Relative %: 58 %
Platelets: 183 10*3/uL (ref 150–400)
RBC: 4.85 MIL/uL (ref 4.22–5.81)
RDW: 13.7 % (ref 11.5–15.5)
WBC: 7.3 10*3/uL (ref 4.0–10.5)
nRBC: 0 % (ref 0.0–0.2)

## 2023-09-30 ENCOUNTER — Ambulatory Visit (HOSPITAL_COMMUNITY)
Admission: RE | Admit: 2023-09-30 | Discharge: 2023-09-30 | Disposition: A | Discharge status: Home / Self Care | Payer: Medicare Other | Attending: Internal Medicine | Admitting: Internal Medicine

## 2023-09-30 ENCOUNTER — Ambulatory Visit (HOSPITAL_COMMUNITY): Payer: Medicare Other | Admitting: Certified Registered"

## 2023-09-30 ENCOUNTER — Encounter (HOSPITAL_COMMUNITY): Payer: Self-pay | Admitting: Internal Medicine

## 2023-09-30 ENCOUNTER — Encounter (HOSPITAL_COMMUNITY)
Admission: RE | Disposition: A | Discharge status: Home / Self Care | Payer: Self-pay | Source: Home / Self Care | Attending: Internal Medicine

## 2023-09-30 DIAGNOSIS — Z79890 Hormone replacement therapy (postmenopausal): Secondary | ICD-10-CM | POA: Diagnosis not present

## 2023-09-30 DIAGNOSIS — K5730 Diverticulosis of large intestine without perforation or abscess without bleeding: Secondary | ICD-10-CM

## 2023-09-30 DIAGNOSIS — Z87891 Personal history of nicotine dependence: Secondary | ICD-10-CM | POA: Insufficient documentation

## 2023-09-30 DIAGNOSIS — E039 Hypothyroidism, unspecified: Secondary | ICD-10-CM | POA: Diagnosis not present

## 2023-09-30 DIAGNOSIS — Z1211 Encounter for screening for malignant neoplasm of colon: Secondary | ICD-10-CM | POA: Insufficient documentation

## 2023-09-30 DIAGNOSIS — D128 Benign neoplasm of rectum: Secondary | ICD-10-CM | POA: Diagnosis not present

## 2023-09-30 DIAGNOSIS — D123 Benign neoplasm of transverse colon: Secondary | ICD-10-CM | POA: Insufficient documentation

## 2023-09-30 DIAGNOSIS — K5150 Left sided colitis without complications: Secondary | ICD-10-CM | POA: Insufficient documentation

## 2023-09-30 DIAGNOSIS — I10 Essential (primary) hypertension: Secondary | ICD-10-CM | POA: Diagnosis not present

## 2023-09-30 HISTORY — PX: BIOPSY: SHX5522

## 2023-09-30 HISTORY — PX: COLONOSCOPY WITH PROPOFOL: SHX5780

## 2023-09-30 HISTORY — PX: POLYPECTOMY: SHX5525

## 2023-09-30 SURGERY — COLONOSCOPY WITH PROPOFOL
Anesthesia: General

## 2023-09-30 MED ORDER — SODIUM CHLORIDE 0.9% FLUSH: 3.0000 mL | Freq: Two times a day (BID) | INTRAVENOUS | Status: DC

## 2023-09-30 MED ORDER — LIDOCAINE HCL (CARDIAC) PF 100 MG/5ML IV SOSY
PREFILLED_SYRINGE | INTRAVENOUS | Status: DC | PRN
  Administered 2023-09-30: 60 mg via INTRATRACHEAL

## 2023-09-30 MED ORDER — LACTATED RINGERS IV SOLN: INTRAVENOUS | Status: DC | PRN

## 2023-09-30 MED ORDER — EPHEDRINE 5 MG/ML INJ
INTRAVENOUS | Status: AC
  Filled 2023-09-30: qty 5

## 2023-09-30 MED ORDER — PROPOFOL 500 MG/50ML IV EMUL
INTRAVENOUS | Status: DC | PRN
  Administered 2023-09-30: 150 ug/kg/min via INTRAVENOUS

## 2023-09-30 MED ORDER — SODIUM CHLORIDE 0.9% FLUSH: 3.0000 mL | INTRAVENOUS | Status: DC | PRN

## 2023-09-30 MED ORDER — EPHEDRINE SULFATE-NACL 50-0.9 MG/10ML-% IV SOSY
PREFILLED_SYRINGE | INTRAVENOUS | Status: DC | PRN
  Administered 2023-09-30: 10 mg via INTRAVENOUS

## 2023-09-30 MED ORDER — SODIUM CHLORIDE 0.9% FLUSH
3.0000 mL | Freq: Two times a day (BID) | INTRAVENOUS | Status: DC
Start: 2023-09-30 — End: 2023-09-30

## 2023-09-30 MED ORDER — PHENYLEPHRINE 80 MCG/ML (10ML) SYRINGE FOR IV PUSH (FOR BLOOD PRESSURE SUPPORT)
PREFILLED_SYRINGE | INTRAVENOUS | Status: DC | PRN
  Administered 2023-09-30: 80 ug via INTRAVENOUS
  Administered 2023-09-30 (×2): 160 ug via INTRAVENOUS

## 2023-09-30 MED ORDER — LIDOCAINE HCL (PF) 2 % IJ SOLN
INTRAMUSCULAR | Status: AC
  Filled 2023-09-30: qty 5

## 2023-09-30 MED ORDER — PHENYLEPHRINE 80 MCG/ML (10ML) SYRINGE FOR IV PUSH (FOR BLOOD PRESSURE SUPPORT)
PREFILLED_SYRINGE | INTRAVENOUS | Status: AC
  Filled 2023-09-30: qty 10

## 2023-09-30 MED ORDER — PROPOFOL 500 MG/50ML IV EMUL
INTRAVENOUS | Status: AC
  Filled 2023-09-30: qty 50

## 2023-09-30 MED ORDER — PROPOFOL 10 MG/ML IV BOLUS
INTRAVENOUS | Status: DC | PRN
Start: 2023-09-30 — End: 2023-09-30
  Administered 2023-09-30: 80 mg via INTRAVENOUS

## 2023-09-30 NOTE — Anesthesia Preprocedure Evaluation (Signed)
 Anesthesia Evaluation  Patient identified by MRN, date of birth, ID band Patient awake    Reviewed: Allergy & Precautions, H&P , NPO status , Patient's Chart, lab work & pertinent test results, reviewed documented beta blocker date and time   Airway Mallampati: II  TM Distance: >3 FB Neck ROM: full    Dental no notable dental hx. (+) Dental Advisory Given, Teeth Intact   Pulmonary neg pulmonary ROS, former smoker   Pulmonary exam normal breath sounds clear to auscultation       Cardiovascular Exercise Tolerance: Good hypertension, Normal cardiovascular exam Rhythm:regular Rate:Normal     Neuro/Psych negative neurological ROS  negative psych ROS   GI/Hepatic Neg liver ROS, PUD,,,UC   Endo/Other  Hypothyroidism    Renal/GU negative Renal ROS  negative genitourinary   Musculoskeletal   Abdominal   Peds  Hematology negative hematology ROS (+)   Anesthesia Other Findings   Reproductive/Obstetrics negative OB ROS                             Anesthesia Physical Anesthesia Plan  ASA: 2  Anesthesia Plan: General   Post-op Pain Management: Minimal or no pain anticipated   Induction: Intravenous  PONV Risk Score and Plan: Propofol infusion  Airway Management Planned: Natural Airway and Nasal Cannula  Additional Equipment: None  Intra-op Plan:   Post-operative Plan:   Informed Consent: I have reviewed the patients History and Physical, chart, labs and discussed the procedure including the risks, benefits and alternatives for the proposed anesthesia with the patient or authorized representative who has indicated his/her understanding and acceptance.     Dental Advisory Given  Plan Discussed with: CRNA  Anesthesia Plan Comments:        Anesthesia Quick Evaluation

## 2023-09-30 NOTE — Transfer of Care (Signed)
 Immediate Anesthesia Transfer of Care Note  Patient: Jerome Stark  Procedure(s) Performed: COLONOSCOPY WITH PROPOFOL BIOPSY POLYPECTOMY  Patient Location: Short Stay  Anesthesia Type:General  Level of Consciousness: drowsy and patient cooperative  Airway & Oxygen Therapy: Patient Spontanous Breathing and Patient connected to nasal cannula oxygen  Post-op Assessment: Report given to RN and Post -op Vital signs reviewed and stable  Post vital signs: Reviewed and stable  Last Vitals:  Vitals Value Taken Time  BP 106/57 09/30/23 1215  Temp 36.4 09/30/23   1216  Pulse 65 09/30/23 1218  Resp 16 09/30/23   1216  SpO2 93 % 09/30/23 1218  Vitals shown include unfiled device data.  Last Pain:  Vitals:   09/30/23 1139  TempSrc:   PainSc: 0-No pain         Complications: No notable events documented.

## 2023-09-30 NOTE — Anesthesia Postprocedure Evaluation (Signed)
 Anesthesia Post Note  Patient: Jerome Stark  Procedure(s) Performed: COLONOSCOPY WITH PROPOFOL BIOPSY POLYPECTOMY  Patient location during evaluation: PACU Anesthesia Type: General Level of consciousness: awake and alert Pain management: pain level controlled Vital Signs Assessment: post-procedure vital signs reviewed and stable Respiratory status: spontaneous breathing, nonlabored ventilation, respiratory function stable and patient connected to nasal cannula oxygen Cardiovascular status: blood pressure returned to baseline and stable Postop Assessment: no apparent nausea or vomiting Anesthetic complications: no   There were no known notable events for this encounter.   Last Vitals:  Vitals:   09/30/23 1121 09/30/23 1219  BP: (!) 142/79 (!) 106/57  Pulse: (!) 53 60  Resp: 20 14  Temp: (!) 36.4 C (!) 36.4 C  SpO2: 100% 98%    Last Pain:  Vitals:   09/30/23 1219  TempSrc: Axillary  PainSc: 0-No pain                 Koah Chisenhall L Tawnia Schirm

## 2023-09-30 NOTE — H&P (Signed)
 @LOGO @   Primary Care Physician:  [facility] Primary Gastroenterologist:  Dr.   [name]  Pre-Procedure History & Physical: HPI:  Jerome Stark is a 78 y.o. 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   on Lialda for many years.    Reports doing well.  Did not have a CRP  may have had it done 2 weeks the [place].  Past Medical History:  Diagnosis Date   Hyperlipidemia    Hypertension    Hypothyroidism    Proctocolitis    On Lialda   Ulcerative colitis (HCC)    On Lialda    Past Surgical History:  Procedure Laterality Date   BACK SURGERY     lower back / lumbar    COLONOSCOPY  2003   Dr. Jena Gauss: idiopathic proctitis   COLONOSCOPY  10/2013   Dr. Teena Dunk: diminutive hyperplastic polyp. scope advanced to the cecum, no intubation of TI, no random biopsies performed   COLONOSCOPY N/A 11/29/2014   Dr. Jena Gauss: left-sided proctocolitis; surveillance due in 2024   KNEE ARTHROPLASTY Right 01/08/2017   Procedure: RIGHT TOTAL KNEE ARTHROPLASTY WITH COMPUTER NAVIGATION;  Surgeon: Samson Frederic, MD;  Location: [facility];  Service: Orthopedics;  Laterality: Right;  Needs RNFA; Adductor block   KNEE ARTHROPLASTY Left 02/26/2017   Procedure: LEFT TOTAL KNEE ARTHROPLASTY;  Surgeon: Samson Frederic, MD;  Location: [facility];  Service: Orthopedics;  Laterality: Left;  Needs RNFA   LUNG SURGERY     collapsed lung; bilateral per patient "they called it a bleb that burst"     Prior to Admission medications   Medication Sig Start Date End Date Taking? Authorizing Provider  acetaminophen (TYLENOL) 500 MG tablet Take 1,500-2,000 mg by mouth every 6 (six) hours as needed for moderate pain or headache.   Yes [provider]  Ascorbic Acid (VITAMIN C PO) Take 500 mg by mouth daily.   Yes [provider]  bumetanide (BUMEX) 0.5 MG tablet Take 0.5 mg by mouth daily.   Yes [provider]  co-enzyme Q-10 30 MG capsule Take 30 mg by  mouth daily.   Yes [provider]  ferrous sulfate 325 (65 FE) MG EC tablet Take 325 mg by mouth 3 (three) times a week.   Yes [provider]  fluocinonide cream (LIDEX) 0.05 % Apply 1 Application topically 2 (two) times daily as needed.   Yes [provider]  levothyroxine (SYNTHROID, LEVOTHROID) 75 MCG tablet Take 75 mcg by mouth daily before breakfast.   Yes [provider]  lisinopril (ZESTRIL) 20 MG tablet Take 40 mg by mouth daily. 03/15/21  Yes [provider]  mesalamine (LIALDA) 1.2 g EC tablet Take 4 tablets (4.8 g total) by mouth daily with breakfast. 11/04/21  Yes Letta Median, PA-C  metoprolol succinate (TOPROL-XL) 100 MG 24 hr tablet Take 100 mg by mouth daily. Take with or immediately following a meal.   Yes [provider]  VITAMIN D, CHOLECALCIFEROL, PO Take 1,000 Units by mouth daily.   Yes [provider]  zinc gluconate 50 MG tablet Take 50 mg by mouth daily.   Yes [provider]  amLODipine (NORVASC) 5 MG tablet Take 1 tablet (5 mg total) by mouth daily. 03/19/23 08/18/23  Jodelle Red, MD  rosuvastatin (CRESTOR) 20 MG tablet Take 1 tablet (20 mg total) by mouth daily. 09/17/21 08/18/23  Jodelle Red, MD    Allergies as of 08/18/2023   (No Known Allergies)  Family History  Problem Relation Age of Onset   Hypertension Mother    Heart disease Mother    Heart attack Brother        coronary artery stent at age 63   Heart attack Maternal Grandfather        died of MI in late 48s   Hypertension Other    Colon cancer Neg Hx    Inflammatory bowel disease Neg Hx     Social History   Socioeconomic History   Marital status: Married    Spouse name: Not on file   Number of children: Not on file   Years of education: Not on file   Highest education level: Not on file  Occupational History   Not on file  Tobacco Use   Smoking status: Former    Current packs/day: 0.00     Types: Cigarettes    Quit date: 02/14/1985    Years since quitting: 38.6   Smokeless tobacco: Never   Tobacco comments:    smoked 1 ppd for 25 years, quit at age 51  Vaping Use   Vaping status: Never Used  Substance and Sexual Activity   Alcohol use: Yes    Alcohol/week: 0.0 standard drinks of alcohol    Comment: occasional wine   Drug use: No   Sexual activity: Not on file  Other Topics Concern   Not on file  Social History Narrative   Not on file   Social Drivers of Health   Financial Resource Strain: Not on file  Food Insecurity: Not on file  Transportation Needs: Not on file  Physical Activity: Not on file  Stress: Not on file  Social Connections: Not on file  Intimate Partner Violence: Not on file    Review of Systems: See HPI, otherwise negative ROS  Physical Exam: BP (!) 142/79   Pulse (!) 53   Temp (!) 97.5 F (36.4 C) (Oral)   Resp 20   Ht 5\' 11"  (1.803 m)   Wt 91.3 kg   SpO2 100%   BMI 28.07 kg/m  General:   Alert,  Well-developed, well-nourished, pleasant and cooperative in NAD Neck:  Supple; no masses or thyromegaly. No significant cervical adenopathy. Lungs:  Clear throughout to auscultation.   No wheezes, crackles, or rhonchi. No acute distress. Heart:  Regular rate and rhythm; no murmurs, clicks, rubs,  or gallops. Abdomen: Non-distended, normal bowel sounds.  Soft and nontender without appreciable mass or hepatosplenomegaly.  Impression/Plan:    Longstanding left-sided proctocolitis in the 78 year old gentleman clinically well-controlled on Lialda.  Here for surveillance colonoscopy.  The risks, benefits, limitations, alternatives and imponderables have been reviewed with the patient. Questions have been answered. All parties are agreeable.       Notice: This dictation was prepared with Dragon dictation along with smaller phrase technology. Any transcriptional errors that result from this process are unintentional and may not be corrected upon  review.

## 2023-09-30 NOTE — Discharge Instructions (Addendum)
  Colonoscopy Discharge Instructions  Read the instructions outlined below and refer to this sheet in the next few weeks. These discharge instructions provide you with general information on caring for yourself after you leave the hospital. Your doctor may also give you specific instructions. While your treatment has been planned according to the most current medical practices available, unavoidable complications occasionally occur. If you have any problems or questions after discharge, call Dr. Jena Gauss at [phone]. ACTIVITY You may resume your regular activity, but move at a slower pace for the next 24 hours.  Take frequent rest periods for the next 24 hours.  Walking will help get rid of the air and reduce the bloated feeling in your belly (abdomen).  No driving for 24 hours (because of the medicine (anesthesia) used during the test).   Do not sign any important legal documents or operate any machinery for 24 hours (because of the anesthesia used during the test).  NUTRITION Drink plenty of fluids.  You may resume your normal diet as instructed by your doctor.  Begin with a light meal and progress to your normal diet. Heavy or fried foods are harder to digest and may make you feel sick to your stomach (nauseated).  Avoid alcoholic beverages for 24 hours or as instructed.  MEDICATIONS You may resume your normal medications unless your doctor tells you otherwise.  WHAT YOU CAN EXPECT TODAY Some feelings of bloating in the abdomen.  Passage of more gas than usual.  Spotting of blood in your stool or on the toilet paper.  IF YOU HAD POLYPS REMOVED DURING THE COLONOSCOPY: No aspirin products for 7 days or as instructed.  No alcohol for 7 days or as instructed.  Eat a soft diet for the next 24 hours.  FINDING OUT THE RESULTS OF YOUR TEST Not all test results are available during your visit. If your test results are not back during the visit, make an appointment with your caregiver to find out the  results. Do not assume everything is normal if you have not heard from your caregiver or the medical facility. It is important for you to follow up on all of your test results.  SEEK IMMEDIATE MEDICAL ATTENTION IF: You have more than a spotting of blood in your stool.  Your belly is swollen (abdominal distention).  You are nauseated or vomiting.  You have a temperature over 101.  You have abdominal pain or discomfort that is severe or gets worse throughout the day.         Inactive colitis found.  Biopsies taken.  We had a couple of polyps in your colon which were removed.  Further recommendations to follow pending review of pathology report   at patient request, called Wyvonna Plum at [phone]-reviewed findings and recommendations

## 2023-09-30 NOTE — Op Note (Signed)
 [facility] Patient Name: Jerome Stark Procedure Date: 09/30/2023 11:17 AM MRN: [id number] Date of Birth: [birth year] Attending MD: Gennette Pac , MD, [id number] CSN: [id number] Age: 78 Admit Type: Outpatient Procedure:                Colonoscopy Indications:              High risk colon cancer surveillance: Ulcerative                            left sided colitis of 8 (or more) years duration Providers:                Gennette Pac, MD, Francoise Ceo RN, RN,                            Pandora Leiter, Technician Referring MD:              Medicines:                Propofol per Anesthesia Complications:            No immediate complications. Estimated Blood Loss:     Estimated blood loss was minimal. Procedure:                Pre-Anesthesia Assessment:                           - Prior to the procedure, a History and Physical                            was performed, and patient medications and                            allergies were reviewed. The patient's tolerance of                            previous anesthesia was also reviewed. The risks                            and benefits of the procedure and the sedation                            options and risks were discussed with the patient.                            All questions were answered, and informed consent                            was obtained. Prior Anticoagulants: The patient has                            taken no anticoagulant or antiplatelet agents. ASA                            Grade Assessment: III - A patient with severe  systemic disease. After reviewing the risks and                            benefits, the patient was deemed in satisfactory                            condition to undergo the procedure.                           After obtaining informed consent, the colonoscope                            was passed under direct vision. Throughout the                             procedure, the patient's blood pressure, pulse, and                            oxygen saturations were monitored continuously. The                            [id number]) scope was introduced through the                            anus and advanced to the the cecum, identified by                            appendiceal orifice and ileocecal valve. The                            colonoscopy was performed without difficulty. The                            patient tolerated the procedure well. The ileocecal                            valve, appendiceal orifice, and rectum were                            photographed. Scope In: 11:44:55 AM Scope Out: 12:07:37 PM Scope Withdrawal Time: 0 hours 19 minutes 57 seconds  Total Procedure Duration: 0 hours 22 minutes 42 seconds  Findings:      The perianal and digital rectal examinations were normal.      A few medium-mouthed diverticula were found in the left colon.      A 13 mm polyp was found in the distal rectum. The polyp was sessile. The       polyp was removed with a hot snare. Resection and retrieval were       complete. Estimated blood loss: none.      Four sessile polyps were found in the splenic flexure. The polyps were 2       to 3 mm in size. These polyps were removed with a cold snare. Resection       and retrieval were complete. Estimated blood loss was minimal.  Three sessile polyps were found in the splenic flexure. The polyps were       2 to 3 mm in size. These polyps were removed with a cold biopsy forceps.       Resection and retrieval were complete. Estimated blood loss was minimal.      Segment of cold biopsies throughout the ascending transverse descending       sigmoid and rectal mucosa were taken for histologic study. Rectal mucosa       really look good except for the descending and sigmoid segments where       the mucosa was somewhat pale no erosions ulcerations were seen vascular       pattern  well-preserved.      The exam was otherwise without abnormality on direct and retroflexion       views. Impression:               - Diverticulosis in the entire colon                           - One polyp in the rectum.                           - One 13 mm polyp in the distal rectum, removed                            with a hot snare. Resected and retrieved.                           - Four 2 to 3 mm polyps at the splenic flexure,                            removed with a cold snare. Resected and retrieved.                           - Three 2 to 3 mm polyps at the splenic flexure,                            removed with a cold biopsy forceps. Resected and                            retrieved. Status post segmental biopsy throughout                            the colon and rectum                           - The examination was otherwise normal on direct                            and retroflexion views. Moderate Sedation:      Moderate (conscious) sedation was personally administered by an       anesthesia professional. The following parameters were monitored: oxygen       saturation, heart rate, blood pressure, respiratory rate, EKG, adequacy       of pulmonary ventilation, and response to care. Recommendation:           -  Patient has a contact number available for                            emergencies. The signs and symptoms of potential                            delayed complications were discussed with the                            patient. Return to normal activities tomorrow.                            Written discharge instructions were provided to the                            patient.                           - Advance diet as tolerated.                           - Repeat colonoscopy after studies are complete for                            surveillance.                           - Return to GI office (date not yet determined). Procedure Code(s):        --- Professional  ---                           [phone], Colonoscopy, flexible; with removal of                            tumor(s), polyp(s), or other lesion(s) by snare                            technique                           45380, 59, Colonoscopy, flexible; with biopsy,                            single or multiple Diagnosis Code(s):        --- Professional ---                           K51.50, Left sided colitis without complications                           D12.8, Benign neoplasm of rectum                           D12.3, Benign neoplasm of transverse colon (hepatic  flexure or splenic flexure)                           K57.30, Diverticulosis of large intestine without                            perforation or abscess without bleeding CPT copyright 2022 American Medical Association. All rights reserved. The codes documented in this report are preliminary and upon coder review may  be revised to meet current compliance requirements. Gerrit Friends. Washington Whedbee, MD Gennette Pac, MD 09/30/2023 12:21:55 PM This report has been signed electronically. Number of Addenda: 0

## 2023-10-01 ENCOUNTER — Encounter (HOSPITAL_COMMUNITY): Payer: Self-pay | Admitting: Internal Medicine

## 2023-10-01 LAB — SURGICAL PATHOLOGY

## 2023-10-06 ENCOUNTER — Encounter: Payer: Self-pay | Admitting: Internal Medicine

## 2023-10-06 ENCOUNTER — Telehealth: Payer: Self-pay

## 2023-10-06 NOTE — Telephone Encounter (Signed)
-----   Message from [facility] Mindy E sent at 10/06/2023  1:57 PM EST ----- This is for you. It;s a lab ----- Message ----- From: Gatha Mayer, CMA Sent: 10/06/2023   1:49 PM EST To: Elinor Dodge, LPN; Armstead Peaks, CMA   ----- Message ----- From: Corbin Ade, MD Sent: 10/06/2023   1:49 PM EST To: Michail Sermon; Gatha Mayer, CMA  Needs CRP done which is overdue.  Needs office follow-up with Ermalinda Memos in 4 months

## 2023-10-06 NOTE — Telephone Encounter (Signed)
 Spoke with pt and he is going to bring a copy of his bloodwork that was done recently at the [place] and if it is not one that they did he will go have it done. Pt will also schedule an appt with Kristen in 4 mths when he comes to drop off lab results.

## 2023-10-09 LAB — C-REACTIVE PROTEIN: CRP: 4 mg/L (ref 0–10)

## 2024-03-21 ENCOUNTER — Telehealth: Payer: Self-pay | Admitting: Gastroenterology

## 2024-03-21 NOTE — Telephone Encounter (Signed)
 Requested most recent labs 3 times from [facility] and never received any notification back from them.

## 2024-04-14 ENCOUNTER — Telehealth: Payer: Self-pay

## 2024-04-14 NOTE — Telephone Encounter (Signed)
 error

## 2024-05-20 ENCOUNTER — Telehealth (HOSPITAL_BASED_OUTPATIENT_CLINIC_OR_DEPARTMENT_OTHER): Payer: Self-pay | Admitting: Cardiology

## 2024-05-23 ENCOUNTER — Encounter (HOSPITAL_BASED_OUTPATIENT_CLINIC_OR_DEPARTMENT_OTHER): Payer: Self-pay | Admitting: Cardiology

## 2024-05-23 ENCOUNTER — Ambulatory Visit (HOSPITAL_BASED_OUTPATIENT_CLINIC_OR_DEPARTMENT_OTHER): Admitting: Cardiology

## 2024-05-23 VITALS — BP 126/70 | HR 56 | Ht 71.0 in | Wt 201.9 lb

## 2024-05-23 DIAGNOSIS — I700 Atherosclerosis of aorta: Secondary | ICD-10-CM

## 2024-05-23 DIAGNOSIS — Z712 Person consulting for explanation of examination or test findings: Secondary | ICD-10-CM

## 2024-05-23 DIAGNOSIS — I10 Essential (primary) hypertension: Secondary | ICD-10-CM

## 2024-05-23 DIAGNOSIS — I2510 Atherosclerotic heart disease of native coronary artery without angina pectoris: Secondary | ICD-10-CM

## 2024-05-23 DIAGNOSIS — E7800 Pure hypercholesterolemia, unspecified: Secondary | ICD-10-CM | POA: Diagnosis not present

## 2024-05-23 DIAGNOSIS — I480 Paroxysmal atrial fibrillation: Secondary | ICD-10-CM

## 2024-05-23 MED ORDER — AMLODIPINE BESYLATE 5 MG PO TABS: 5.0000 mg | ORAL_TABLET | Freq: Every day | ORAL | 3 refills | Status: AC

## 2024-05-23 MED ORDER — AMLODIPINE BESYLATE 5 MG PO TABS: 5.0000 mg | ORAL_TABLET | Freq: Every day | ORAL | 3 refills | Status: DC

## 2024-05-23 MED ORDER — ROSUVASTATIN CALCIUM 20 MG PO TABS: 20.0000 mg | ORAL_TABLET | Freq: Every day | ORAL | 3 refills | Status: AC

## 2024-05-23 NOTE — Patient Instructions (Signed)
 Look into Kardia mobile device to check rhythm  Our fax number is [phone].  Please have your [facility] doctor send your next visit note to us  so we know what the plan is.  Medication Instructions:  No changes *If you need a refill on your cardiac medications before your next appointment, please call your pharmacy*  Lab Work: none If you have labs (blood work) drawn today and your tests are completely normal, you will receive your results only by: MyChart Message (if you have MyChart) OR A paper copy in the mail If you have any lab test that is abnormal or we need to change your treatment, we will call you to review the results.  Testing/Procedures: none  Follow-Up: At [facility], you and your health needs are our priority.  As part of our continuing mission to provide you with exceptional heart care, our providers are all part of one team.  This team includes your primary Cardiologist (physician) and Advanced Practice Providers or APPs (Physician Assistants and Nurse Practitioners) who all work together to provide you with the care you need, when you need it.  Your next appointment:   3 month(s)  Provider:   Shelda Bruckner, MD, Rosaline Bane, NP, or Reche Finder, NP    We recommend signing up for the patient portal called MyChart.  Sign up information is provided on this After Visit Summary.  MyChart is used to connect with patients for Virtual Visits (Telemedicine).  Patients are able to view lab/test results, encounter notes, upcoming appointments, etc.  Non-urgent messages can be sent to your provider as well.   To learn more about what you can do with MyChart, go to [email or web address].   Other Instructions

## 2024-05-23 NOTE — Progress Notes (Signed)
 Cardiology Office Note:  .    Date:  05/23/2024  ID:  Jerome Stark, DOB 06/02/1946, MRN [id number] PCP: [facility]  [facility] Providers Cardiologist:  Shelda Bruckner, MD     History of Present Illness: .    Jerome Stark is a 78 y.o. male with a hx of CAD, hypertension, hyperlipidemia, hypothyroidism, and ulcerative colitis, who is seen for follow-up. He was initially seen 08/28/21 for post hospital follow-up for chest pain.   Cardiac history: He presented to the ED 08/06/21 following 2 episodes of substernal chest pain described as a burning sensation radiating down to the right nipple. He had 2 more episodes while waiting in the lobby, but had no active chest pain during his evaluation. Troponin and delta troponin were negative, and his heart score was 3. CT PE was negative. He was discharged with outpatient cardiology follow-up. CT coronary 09/2021 showed Ca score 875 (77th %ile). Moderate stenosis of 50-69% in mid LAD with otherwise mild disease. FFR was 0.74 in mid LAD. We discussed medical management and cath, see below.   FH: His younger brother had a stent placed and was on a statin several years ago. His mother had open heart surgery when she was 77 years old.   Today: On 10/13, he woke up at 3 AM, noted that his blood pressure was elevated. HR was normal. On 10/16 he woke up feeling that his heart was beating hard. HR when he checked was 146 bpm. This came down before his next recheck BP/HR an hour later. Lasted longer than 10 minutes.   Reviewed BP logs. BP well controlled until the end of September, and BP has been consistently elevated since then. He reports that his BP medications were changed at the [place] because some of them were hard on my kidneys.   On review of his current medications, he is no longer on amlodipine  or rosuvastatin . Has not had significant leg swelling recently.   We reviewed his monitor together. He has brief afib, longest episode  7 minutes, total daily duration longest 16 minutes. He was started on aspirin  after this, but we discussed that aspirin  is not an appropriate anticoagulant for afib. Also discussed gray area re: how much afib requires anticoagulation. He is already having bruising on the aspirin , but no significant bleeding.   ROS:    Studies Reviewed: SABRA    EKG Interpretation Date/Time:  Monday May 23 2024 14:00:11 EDT Ventricular Rate:  55 PR Interval:  172 QRS Duration:  98 QT Interval:  446 QTC Calculation: 426 R Axis:   -23  Text Interpretation: Sinus bradycardia Incomplete right bundle branch block  Nonspecific ST pattern Confirmed by Bruckner Shelda [phone]) on 05/23/2024 6:08:37 PM    Physical Exam:    VS:  BP 126/70   Pulse (!) 56   Ht 5' 11 (1.803 m)   Wt 201 lb 14.4 oz (91.6 kg)   SpO2 95%   BMI 28.16 kg/m    Wt Readings from Last 3 Encounters:  05/23/24 201 lb 14.4 oz (91.6 kg)  09/30/23 201 lb 4.5 oz (91.3 kg)  09/28/23 201 lb 4.5 oz (91.3 kg)    GEN: Well nourished, well developed in no acute distress HEENT: Normal, moist mucous membranes NECK: No JVD CARDIAC: regular rhythm, normal S1 and S2, no rubs or gallops. No murmur. VASCULAR: Radial and DP pulses 2+ bilaterally. No carotid bruits RESPIRATORY:  Clear to auscultation without rales, wheezing or rhonchi  ABDOMEN: Soft,  non-tender, non-distended MUSCULOSKELETAL:  Ambulates independently SKIN: Warm and dry, no edema NEUROLOGIC:  Alert and oriented x 3. No focal neuro deficits noted. PSYCHIATRIC:  Normal affect   ASSESSMENT AND PLAN: .    Paroxysmal atrial fibrillation, low burden -reviewed his monitor results together, see media tab -CHA2DS2/VAS Stroke Risk Points=4    -we discussed three options: stopping aspirin  (had burst blood vessels in the past on this), continuing aspirin  for CV risk reduction, or stopping aspirin  and starting DOAC. We discussed that the absolute burden of afib is low right now but  might increase in the future -after share decision making, will continue aspirin  for now to determine if he has bleeding -discussed kardia mobile -discussed Watchman if he cannot tolerate anticoagulation  -drinks 2-3 glasses/wine a day when he drinks, but can go weeks without. Hasn't had any in the last few days. Discussed avoiding alcohol  -has not been diagnosed with sleep apnea, denies snoring  Coronary artery disease Aortic atherosclerosis Family history of heart disease -continue rosuvastatin . He thinks this was not recently refilled through the [place], will refill today. -restarted on aspirin  81 mg daily. He reports this was started after he wore the heart monitor. We discussed risk of CVA, that aspirin  is not adequate for this. -we discussed red flag warning signs that need immediate medical attention -discussed how to use SL NG, discussed risk with PDE5 inhibitors   Hypertension: has been elevated consistently the last several weeks. Appears he was no longer prescribed amlodipine  5 mg daily from the [place]. He endorsed significant LE edema on 10 mg dose but believes he was tolerating 5 mg dose -from patient's records, he is currently prescribed bumex 0.5 mg daily, lisinopril 40 mg daily, metoprolol  succinate 200 mg daily -restarting amlodipine  5 mg daily, discussed watching for LE edema   Cardiac risk counseling and prevention recommendations: -recommend heart healthy/Mediterranean diet, with whole grains, fruits, vegetable, fish, lean meats, nuts, and olive oil. Limit salt. -recommend moderate walking, 3-5 times/week for 30-50 minutes each session. Aim for at least 150 minutes.week. Goal should be pace of 3 miles/hours, or walking 1.5 miles in 30 minutes -recommend avoidance of tobacco products. Avoid excess alcohol .  Dispo: Follow-up in 3 months  Total time of encounter: I spent 62 minutes dedicated to the care of this patient on the date of this encounter to include pre-visit review of  records, face-to-face time with the patient discussing conditions above, and clinical documentation with the electronic health record. We specifically spent time today discussing atrial fibrillation at length, risk of CVA, hypertension   Signed, Shelda Bruckner, MD

## 2024-08-08 ENCOUNTER — Ambulatory Visit (HOSPITAL_BASED_OUTPATIENT_CLINIC_OR_DEPARTMENT_OTHER): Admitting: Cardiology

## 2024-08-23 ENCOUNTER — Ambulatory Visit (INDEPENDENT_AMBULATORY_CARE_PROVIDER_SITE_OTHER): Admitting: Cardiology

## 2024-08-23 ENCOUNTER — Encounter (HOSPITAL_BASED_OUTPATIENT_CLINIC_OR_DEPARTMENT_OTHER): Payer: Self-pay | Admitting: Cardiology

## 2024-08-23 VITALS — BP 124/72 | HR 54 | Ht 71.0 in | Wt 205.0 lb

## 2024-08-23 DIAGNOSIS — I700 Atherosclerosis of aorta: Secondary | ICD-10-CM | POA: Diagnosis not present

## 2024-08-23 DIAGNOSIS — I10 Essential (primary) hypertension: Secondary | ICD-10-CM | POA: Diagnosis not present

## 2024-08-23 DIAGNOSIS — I2510 Atherosclerotic heart disease of native coronary artery without angina pectoris: Secondary | ICD-10-CM

## 2024-08-23 DIAGNOSIS — I480 Paroxysmal atrial fibrillation: Secondary | ICD-10-CM | POA: Diagnosis not present

## 2024-08-23 DIAGNOSIS — Z8249 Family history of ischemic heart disease and other diseases of the circulatory system: Secondary | ICD-10-CM | POA: Diagnosis not present

## 2024-08-23 NOTE — Progress Notes (Signed)
 " Cardiology Office Note:  .    Date:  08/23/2024  ID:  Jerome Stark, DOB 02/06/1946, MRN [id number] PCP: [facility] Providers Cardiologist:  Jerome Bruckner, MD     History of Present Illness: .    Jerome Stark is a 79 y.o. male with a hx of CAD, hypertension, hyperlipidemia, hypothyroidism, and ulcerative colitis, who is seen for follow-up. He was initially seen 08/28/21 for post hospital follow-up for chest pain.   Cardiac history: He presented to the ED 08/06/21 following 2 episodes of substernal chest pain described as a burning sensation radiating down to the right nipple. He had 2 more episodes while waiting in the lobby, but had no active chest pain during his evaluation. Troponin and delta troponin were negative, and his heart score was 3. CT PE was negative. He was discharged with outpatient cardiology follow-up. CT coronary 09/2021 showed Ca score 875 (77th %ile). Moderate stenosis of 50-69% in mid LAD with otherwise mild disease. FFR was 0.74 in mid LAD. We discussed medical management and cath, see below.   FH: His younger brother had a stent placed and was on a statin several years ago. His mother had open heart surgery when she was 78 years old.   Today: Blood pressure in office at goal today, reviewed home logs. Lowest 105/57, highest 170/87. Notes though that the AM readings are before medication (which are the majority of readings). Doesn't feel differently between the readings. Had a few times when he woke up in the middle of the night because his heart was beating hard (happens rarely), blood pressure didn't have a clear pattern with these episodes. Has a crist now, reviewed today. Two episodes that do look like afib, with faster heart rates. Remainder are sinus/sinus with PACs. Hasn't felt prolonged feeling of abnormal beats, when he feels them he sits still and symptoms resolved within a few minutes.  No issues with leg swelling on the  amlodipine .  ROS:  Denies chest pain, shortness of breath at rest or with normal exertion. No PND, orthopnea, LE edema or unexpected weight gain. No syncope or palpitations. ROS otherwise negative except as noted.   Studies Reviewed: [facility]         Physical Exam:    VS:  BP 124/72   Pulse (!) 54   Ht 5' 11 (1.803 m)   Wt 205 lb (93 kg)   SpO2 94%   BMI 28.59 kg/m    Wt Readings from Last 3 Encounters:  08/23/24 205 lb (93 kg)  05/23/24 201 lb 14.4 oz (91.6 kg)  09/30/23 201 lb 4.5 oz (91.3 kg)    GEN: Well nourished, well developed in no acute distress HEENT: Normal, moist mucous membranes NECK: No JVD CARDIAC: regular rhythm, normal S1 and S2, no rubs or gallops. No murmur. VASCULAR: Radial and DP pulses 2+ bilaterally. No carotid bruits RESPIRATORY:  Clear to auscultation without rales, wheezing or rhonchi  ABDOMEN: Soft, non-tender, non-distended MUSCULOSKELETAL:  Ambulates independently SKIN: Warm and dry, no edema NEUROLOGIC:  Alert and oriented x 3. No focal neuro deficits noted. PSYCHIATRIC:  Normal affect   ASSESSMENT AND PLAN: .    Hypertension -averages on home numbers have been good. Reviewed how to check BP, goal numbers -continue bumex 0.5 mg daily, lisinopril 40 mg daily, metoprolol  succinate 200 mg daily, amlodipine  5 mg daily (has edema on 10 mg dose)  Paroxysmal atrial fibrillation, low burden -very brief/self limited episodes on monitor.  -  reviewed Kardia strips today -CHA2DS2/VAS Stroke Risk Points=4    -See prior note: we discussed three options: stopping aspirin  (had burst blood vessels in the past on this), continuing aspirin  for CV risk reduction, or stopping aspirin  and starting DOAC. We discussed that the absolute burden of afib is low right now but might increase in the future -no bleeding on aspirin  -using kardia mobile -discussed Watchman if he cannot tolerate anticoagulation  -has not been diagnosed with sleep apnea, denies snoring  Coronary  artery disease Aortic atherosclerosis Family history of heart disease -continue rosuvastatin  20 mg daily, aspirin  81 mg daily -LDL goal <70, followed at the [facility] -we discussed red flag warning signs that need immediate medical attention -discussed how to use SL NG, discussed risk with PDE5 inhibitors   Cardiac risk counseling and prevention recommendations: -recommend heart healthy/Mediterranean diet, with whole grains, fruits, vegetable, fish, lean meats, nuts, and olive oil. Limit salt. -recommend moderate walking, 3-5 times/week for 30-50 minutes each session. Aim for at least 150 minutes.week. Goal should be pace of 3 miles/hours, or walking 1.5 miles in 30 minutes -recommend avoidance of tobacco products. Avoid excess alcohol .  Dispo: Follow-up in 6 months  Signed, Jerome Bruckner, MD   "

## 2024-08-23 NOTE — Patient Instructions (Addendum)
 Medication Instructions:   Your physician recommends that you continue on your current medications as directed. Please refer to the Current Medication list given to you today.  *If you need a refill on your cardiac medications before your next appointment, please call your pharmacy*    Follow-Up: At [facility], you and your health needs are our priority.  As part of our continuing mission to provide you with exceptional heart care, our providers are all part of one team.  This team includes your primary Cardiologist (physician) and Advanced Practice Providers or APPs (Physician Assistants and Nurse Practitioners) who all work together to provide you with the care you need, when you need it.  Your next appointment:   6 month(s)  Provider:   Shelda Bruckner, MD, Rosaline Bane, NP, or Reche Finder, NP      If you get a blood pressure reading more than 150 on the top number or 90 on the bottom number, sit quietly for a few minutes and recheck. If still high, is there any pattern/trigger to why? If you are consistently seeing numbers more than 140 on the top or 85 on the bottom, please let me know (don't wait for next visit).  how to check blood pressure:  -sit comfortably in a chair, feet uncrossed and flat on floor, for 5-10 minutes  -arm ideally should rest at the level of the heart. However, arm should be relaxed and not tense (for example, do not hold the arm up unsupported)  -avoid exercise, caffeine, and tobacco for at least 30 minutes prior to BP reading  -don't take BP cuff reading over clothes (always place on skin directly)  -I prefer to know how well the medication is working, so I would like you to take your readings 1-2 hours after taking your blood pressure medication if possible
